# Patient Record
Sex: Male | Born: 1960 | Race: White | Hispanic: No | Marital: Single | Smoking: Current every day smoker
Health system: Southern US, Community
[De-identification: ages and names within clinical notes are randomized; demographics above are authoritative.]

## PROBLEM LIST (undated history)

## (undated) DIAGNOSIS — F1010 Alcohol abuse, uncomplicated: Secondary | ICD-10-CM

## (undated) DIAGNOSIS — I10 Essential (primary) hypertension: Secondary | ICD-10-CM

## (undated) DIAGNOSIS — K219 Gastro-esophageal reflux disease without esophagitis: Secondary | ICD-10-CM

## (undated) DIAGNOSIS — I82403 Acute embolism and thrombosis of unspecified deep veins of lower extremity, bilateral: Secondary | ICD-10-CM

## (undated) DIAGNOSIS — I2699 Other pulmonary embolism without acute cor pulmonale: Secondary | ICD-10-CM

## (undated) HISTORY — PX: TONSILLECTOMY: SUR1361

## (undated) HISTORY — PX: HERNIA REPAIR: SHX51

---

## 2001-03-27 ENCOUNTER — Ambulatory Visit (HOSPITAL_COMMUNITY): Admission: RE | Admit: 2001-03-27 | Discharge: 2001-03-27 | Payer: Self-pay | Admitting: Gastroenterology

## 2002-10-28 ENCOUNTER — Encounter: Payer: Self-pay | Admitting: Emergency Medicine

## 2002-10-28 ENCOUNTER — Emergency Department (HOSPITAL_COMMUNITY): Admission: EM | Admit: 2002-10-28 | Discharge: 2002-10-28 | Payer: Self-pay | Admitting: Emergency Medicine

## 2007-10-31 ENCOUNTER — Emergency Department (HOSPITAL_COMMUNITY): Admission: EM | Admit: 2007-10-31 | Discharge: 2007-10-31 | Payer: Self-pay | Admitting: Emergency Medicine

## 2008-05-25 ENCOUNTER — Emergency Department (HOSPITAL_COMMUNITY): Admission: EM | Admit: 2008-05-25 | Discharge: 2008-05-25 | Payer: Self-pay | Admitting: Emergency Medicine

## 2008-09-19 ENCOUNTER — Emergency Department (HOSPITAL_COMMUNITY): Admission: EM | Admit: 2008-09-19 | Discharge: 2008-09-19 | Payer: Self-pay | Admitting: Emergency Medicine

## 2008-09-26 ENCOUNTER — Emergency Department (HOSPITAL_COMMUNITY): Admission: EM | Admit: 2008-09-26 | Discharge: 2008-09-27 | Payer: Self-pay | Admitting: Emergency Medicine

## 2011-03-31 NOTE — Procedures (Signed)
[name]. [facility]  Patient:    Cody Porter, Cody Porter                       MRN: [id number] Proc. Date: 03/27/01 Adm. Date:  [id number] Attending:  Charna Elizabeth CC:         Laban Emperor. Cloward, M.D., [facility], [facility] [address]   Procedure Report  DATE OF BIRTH:  [birth year]  PROCEDURE PERFORMED:  Esophagogastroduodenoscopy.  ENDOSCOPIST:  Anselmo Rod, M.D.  INSTRUMENT USED:  Olympus video panendoscope.  INDICATION FOR PROCEDURE:  Epigastric pain and longstanding history of reflux in a 50 year old white male with a longstanding history of alcoholism.  Rule out varices, peptic ulcer disease, esophagitis, gastritis, etc.  PREPROCEDURE PREPARATION:  Informed consent was procured from the patient. The patient was fasting for eight hours prior to the procedure.  PREPROCEDURE PHYSICAL EXAMINATION:  VITAL SIGNS:  Stable.  NECK:  Supple.  CHEST:  Clear to auscultation.  S1, S2 regular  ABDOMEN:  Soft with normal bowel sounds.  DESCRIPTION OF THE PROCEDURE:  The patient was placed in left lateral decubitus position and sedated with 80 mg of Demerol and 7.5 mg of Versed intravenously.  Once the patient was adequately sedated and maintained on low-flow oxygen and continuous cardiac monitoring, the Olympus video panendoscope was advanced through the mouthpiece, over the tongue, into the esophagus under direct vision.  The entire esophagus was widely patent with no evidence of rings, stricture, mass, lesion, or esophagitis, or Barretts mucosa.  The scope was then advanced in the stomach.  There was mild antral gastritis.  The pylorus was widely open.  No ulcers, erosions, masses, or polyps were seen in the stomach.  The proximal small bowel also appeared healthy.  No varices were seen.  IMPRESSION: 1. Mild antral gastritis. 2. Widely patent esophagus, no evidence of esophagitis. 3. Normal proximal small bowel and gastric mucosa except for mild antral  gastritis.  RECOMMENDATIONS: 1. Continue PPI for now. 2. Avoid all nonsteroidals. 3. Abstain from alcohol use. 4. Outpatient followup in the next four weeks. DD:  03/27/01 TD:  03/28/01 Job: [id number]

## 2011-06-30 ENCOUNTER — Emergency Department (HOSPITAL_COMMUNITY): Payer: Self-pay

## 2011-06-30 ENCOUNTER — Emergency Department (HOSPITAL_COMMUNITY)
Admission: EM | Admit: 2011-06-30 | Discharge: 2011-07-01 | Disposition: A | Discharge status: Home / Self Care | Payer: Self-pay | Attending: Emergency Medicine | Admitting: Emergency Medicine

## 2011-06-30 DIAGNOSIS — R109 Unspecified abdominal pain: Secondary | ICD-10-CM | POA: Insufficient documentation

## 2011-06-30 DIAGNOSIS — R319 Hematuria, unspecified: Secondary | ICD-10-CM | POA: Insufficient documentation

## 2011-06-30 DIAGNOSIS — N200 Calculus of kidney: Secondary | ICD-10-CM | POA: Insufficient documentation

## 2011-06-30 LAB — URINALYSIS, ROUTINE W REFLEX MICROSCOPIC
Glucose, UA: NEGATIVE mg/dL
Ketones, ur: 15 mg/dL — AB
Protein, ur: 30 mg/dL — AB

## 2011-06-30 LAB — POCT I-STAT, CHEM 8
BUN: 19 mg/dL (ref 6–23)
Calcium, Ion: 1.12 mmol/L (ref 1.12–1.32)
Creatinine, Ser: 1.3 mg/dL (ref 0.50–1.35)
TCO2: 23 mmol/L (ref 0–100)

## 2011-06-30 LAB — URINE MICROSCOPIC-ADD ON

## 2011-07-02 LAB — URINE CULTURE: Colony Count: NO GROWTH

## 2011-08-15 LAB — BASIC METABOLIC PANEL
CO2: 23
Chloride: 100
Creatinine, Ser: 0.76
GFR calc Af Amer: >60
Potassium: 3.8

## 2011-08-15 LAB — DIFFERENTIAL
Basophils Relative: 1
Eosinophils Absolute: 0.3
Eosinophils Relative: 4
Lymphs Abs: 1.7
Monocytes Absolute: 0.8
Monocytes Relative: 8
Neutrophils Relative %: 70

## 2011-08-15 LAB — CBC
HCT: 47.3
Hemoglobin: 15.9
MCHC: 33.6
MCV: 93.9
RBC: 5.04

## 2011-08-18 LAB — CBC
MCHC: 34.9
Platelets: 372
RDW: 12.7

## 2011-08-18 LAB — DIFFERENTIAL
Basophils Absolute: 0.1
Basophils Relative: 1
Eosinophils Relative: 3
Lymphocytes Relative: 17
Monocytes Absolute: 0.6
Neutro Abs: 5.7

## 2011-08-18 LAB — D-DIMER, QUANTITATIVE: D-Dimer, Quant: 1.21 — ABNORMAL HIGH

## 2011-08-18 LAB — BASIC METABOLIC PANEL
BUN: 9
CO2: 23
Calcium: 9.5
Creatinine, Ser: 0.96
GFR calc non Af Amer: >60
Glucose, Bld: 102 — ABNORMAL HIGH

## 2011-08-18 LAB — POCT CARDIAC MARKERS
CKMB, poc: 1.5
Myoglobin, poc: 82.4

## 2019-01-28 ENCOUNTER — Other Ambulatory Visit: Payer: Self-pay

## 2019-01-28 ENCOUNTER — Inpatient Hospital Stay (HOSPITAL_COMMUNITY)
Admission: EM | Admit: 2019-01-28 | Discharge: 2019-02-03 | DRG: 175 | Disposition: A | Discharge status: Home / Self Care | Payer: Self-pay | Attending: Internal Medicine | Admitting: Internal Medicine

## 2019-01-28 ENCOUNTER — Ambulatory Visit: Payer: Self-pay

## 2019-01-28 ENCOUNTER — Emergency Department (HOSPITAL_COMMUNITY): Payer: Self-pay

## 2019-01-28 ENCOUNTER — Encounter (HOSPITAL_COMMUNITY): Payer: Self-pay | Admitting: Emergency Medicine

## 2019-01-28 DIAGNOSIS — I471 Supraventricular tachycardia: Secondary | ICD-10-CM | POA: Diagnosis present

## 2019-01-28 DIAGNOSIS — F1010 Alcohol abuse, uncomplicated: Secondary | ICD-10-CM | POA: Diagnosis present

## 2019-01-28 DIAGNOSIS — G9341 Metabolic encephalopathy: Secondary | ICD-10-CM | POA: Diagnosis not present

## 2019-01-28 DIAGNOSIS — I82433 Acute embolism and thrombosis of popliteal vein, bilateral: Secondary | ICD-10-CM | POA: Diagnosis present

## 2019-01-28 DIAGNOSIS — Z716 Tobacco abuse counseling: Secondary | ICD-10-CM

## 2019-01-28 DIAGNOSIS — I2601 Septic pulmonary embolism with acute cor pulmonale: Secondary | ICD-10-CM

## 2019-01-28 DIAGNOSIS — I2699 Other pulmonary embolism without acute cor pulmonale: Secondary | ICD-10-CM

## 2019-01-28 DIAGNOSIS — Z8249 Family history of ischemic heart disease and other diseases of the circulatory system: Secondary | ICD-10-CM

## 2019-01-28 DIAGNOSIS — I480 Paroxysmal atrial fibrillation: Secondary | ICD-10-CM | POA: Diagnosis present

## 2019-01-28 DIAGNOSIS — R0602 Shortness of breath: Secondary | ICD-10-CM

## 2019-01-28 DIAGNOSIS — I82403 Acute embolism and thrombosis of unspecified deep veins of lower extremity, bilateral: Secondary | ICD-10-CM

## 2019-01-28 DIAGNOSIS — J9601 Acute respiratory failure with hypoxia: Secondary | ICD-10-CM | POA: Diagnosis present

## 2019-01-28 DIAGNOSIS — R609 Edema, unspecified: Secondary | ICD-10-CM

## 2019-01-28 DIAGNOSIS — F17210 Nicotine dependence, cigarettes, uncomplicated: Secondary | ICD-10-CM | POA: Diagnosis present

## 2019-01-28 DIAGNOSIS — F10239 Alcohol dependence with withdrawal, unspecified: Secondary | ICD-10-CM | POA: Diagnosis not present

## 2019-01-28 DIAGNOSIS — I82413 Acute embolism and thrombosis of femoral vein, bilateral: Secondary | ICD-10-CM | POA: Diagnosis present

## 2019-01-28 DIAGNOSIS — I2609 Other pulmonary embolism with acute cor pulmonale: Principal | ICD-10-CM | POA: Diagnosis present

## 2019-01-28 DIAGNOSIS — Z8673 Personal history of transient ischemic attack (TIA), and cerebral infarction without residual deficits: Secondary | ICD-10-CM

## 2019-01-28 DIAGNOSIS — I82461 Acute embolism and thrombosis of right calf muscular vein: Secondary | ICD-10-CM | POA: Diagnosis present

## 2019-01-28 DIAGNOSIS — Z781 Physical restraint status: Secondary | ICD-10-CM

## 2019-01-28 DIAGNOSIS — I82441 Acute embolism and thrombosis of right tibial vein: Secondary | ICD-10-CM | POA: Diagnosis present

## 2019-01-28 DIAGNOSIS — I10 Essential (primary) hypertension: Secondary | ICD-10-CM | POA: Diagnosis present

## 2019-01-28 HISTORY — DX: Acute embolism and thrombosis of unspecified deep veins of lower extremity, bilateral: I82.403

## 2019-01-28 HISTORY — DX: Other pulmonary embolism without acute cor pulmonale: I26.99

## 2019-01-28 HISTORY — DX: Alcohol abuse, uncomplicated: F10.10

## 2019-01-28 LAB — D-DIMER, QUANTITATIVE: D-Dimer, Quant: 14.58 ug/mL-FEU — ABNORMAL HIGH (ref 0.00–0.50)

## 2019-01-28 LAB — CBC
HEMATOCRIT: 49.8 % (ref 39.0–52.0)
Hemoglobin: 16.5 g/dL (ref 13.0–17.0)
MCH: 33.5 pg (ref 26.0–34.0)
MCHC: 33.1 g/dL (ref 30.0–36.0)
MCV: 101.2 fL — AB (ref 80.0–100.0)
Platelets: 260 10*3/uL (ref 150–400)
RBC: 4.92 MIL/uL (ref 4.22–5.81)
RDW: 13.2 % (ref 11.5–15.5)
WBC: 10.5 10*3/uL (ref 4.0–10.5)
nRBC: 0 % (ref 0.0–0.2)

## 2019-01-28 LAB — BASIC METABOLIC PANEL
Anion gap: 12 (ref 5–15)
BUN: 17 mg/dL (ref 6–20)
CHLORIDE: 101 mmol/L (ref 98–111)
CO2: 20 mmol/L — AB (ref 22–32)
CREATININE: 1.1 mg/dL (ref 0.61–1.24)
Calcium: 9.3 mg/dL (ref 8.9–10.3)
GFR calc non Af Amer: >60 mL/min (ref >60)
GLUCOSE: 114 mg/dL — AB (ref 70–99)
Potassium: 4.4 mmol/L (ref 3.5–5.1)
Sodium: 133 mmol/L — ABNORMAL LOW (ref 135–145)

## 2019-01-28 LAB — BRAIN NATRIURETIC PEPTIDE: B Natriuretic Peptide: 214.4 pg/mL — ABNORMAL HIGH (ref 0.0–100.0)

## 2019-01-28 LAB — RAPID URINE DRUG SCREEN, HOSP PERFORMED
Amphetamines: NOT DETECTED
BENZODIAZEPINES: NOT DETECTED
Barbiturates: NOT DETECTED
Cocaine: NOT DETECTED
Opiates: POSITIVE — AB
Tetrahydrocannabinol: NOT DETECTED

## 2019-01-28 LAB — I-STAT TROPONIN, ED: Troponin i, poc: 0.04 ng/mL (ref 0.00–0.08)

## 2019-01-28 LAB — ETHANOL

## 2019-01-28 LAB — PROTIME-INR
INR: 0.9 (ref 0.8–1.2)
Prothrombin Time: 12.3 seconds (ref 11.4–15.2)

## 2019-01-28 LAB — APTT: aPTT: 38 seconds — ABNORMAL HIGH (ref 24–36)

## 2019-01-28 MED ORDER — THIAMINE HCL 100 MG/ML IJ SOLN
100.0000 mg | Freq: Every day | INTRAMUSCULAR | Status: DC
  Administered 2019-01-29 – 2019-02-02 (×5): 100 mg via INTRAVENOUS
  Filled 2019-01-28 (×6): qty 2

## 2019-01-28 MED ORDER — LORAZEPAM 2 MG/ML IJ SOLN
2.0000 mg | INTRAMUSCULAR | Status: DC | PRN
  Administered 2019-01-28 – 2019-01-31 (×4): 2 mg via INTRAVENOUS
  Filled 2019-01-28 (×4): qty 1

## 2019-01-28 MED ORDER — HEPARIN (PORCINE) 25000 UT/250ML-% IV SOLN
1300.0000 [IU]/h | INTRAVENOUS | Status: DC
  Administered 2019-01-28: 1300 [IU]/h via INTRAVENOUS
  Filled 2019-01-28: qty 250

## 2019-01-28 MED ORDER — SODIUM CHLORIDE (PF) 0.9 % IJ SOLN
INTRAMUSCULAR | Status: AC
  Filled 2019-01-28: qty 50

## 2019-01-28 MED ORDER — HYDRALAZINE HCL 20 MG/ML IJ SOLN
10.0000 mg | Freq: Once | INTRAMUSCULAR | Status: AC
  Administered 2019-01-29: 10 mg via INTRAVENOUS
  Filled 2019-01-28: qty 1

## 2019-01-28 MED ORDER — SODIUM CHLORIDE 0.9 % IV BOLUS
1000.0000 mL | Freq: Once | INTRAVENOUS | Status: AC
  Administered 2019-01-28: 1000 mL via INTRAVENOUS

## 2019-01-28 MED ORDER — HEPARIN BOLUS VIA INFUSION
4000.0000 [IU] | Freq: Once | INTRAVENOUS | Status: AC
  Administered 2019-01-28: 4000 [IU] via INTRAVENOUS
  Filled 2019-01-28: qty 4000

## 2019-01-28 MED ORDER — MORPHINE SULFATE (PF) 2 MG/ML IV SOLN
2.0000 mg | INTRAVENOUS | Status: DC | PRN
  Administered 2019-01-29 – 2019-02-02 (×20): 2 mg via INTRAVENOUS
  Filled 2019-01-28 (×21): qty 1

## 2019-01-28 MED ORDER — MORPHINE SULFATE (PF) 4 MG/ML IV SOLN
4.0000 mg | Freq: Once | INTRAVENOUS | Status: AC
  Administered 2019-01-28: 4 mg via INTRAVENOUS
  Filled 2019-01-28: qty 1

## 2019-01-28 MED ORDER — IOPAMIDOL (ISOVUE-370) INJECTION 76%
100.0000 mL | Freq: Once | INTRAVENOUS | Status: AC | PRN
  Administered 2019-01-28: 74 mL via INTRAVENOUS

## 2019-01-28 MED ORDER — METOPROLOL TARTRATE 12.5 MG HALF TABLET
12.5000 mg | ORAL_TABLET | Freq: Two times a day (BID) | ORAL | Status: DC
  Administered 2019-01-28 – 2019-01-29 (×2): 12.5 mg via ORAL
  Filled 2019-01-28 (×2): qty 1

## 2019-01-28 MED ORDER — FOLIC ACID 5 MG/ML IJ SOLN
1.0000 mg | Freq: Every day | INTRAMUSCULAR | Status: DC
  Administered 2019-01-29 – 2019-02-02 (×5): 1 mg via INTRAVENOUS
  Filled 2019-01-28 (×7): qty 0.2

## 2019-01-28 MED ORDER — THIAMINE HCL 100 MG/ML IJ SOLN
Freq: Once | INTRAVENOUS | Status: AC
  Administered 2019-01-28: 17:00:00 via INTRAVENOUS
  Filled 2019-01-28: qty 1000

## 2019-01-28 NOTE — ED Provider Notes (Signed)
[facility]-EMERGENCY DEPT Provider Note   CSN: [id number] Arrival date & time: 01/28/19  1121    History   Chief Complaint Chief Complaint  Patient presents with  . Chest Pain    HPI Cody Porter is a 58 y.o. male who presents with chest pain and cough.  Past medical history significant for hypertension, smoking, and alcohol abuse.  The patient states that for the past 5 days he has been coughing and having nonradiating left-sided chest pain. The pain is worse when he coughs and with certain movements. He feels congested but cannot cough anything up.  Today he coughed up up some sputum with streaks of blood in it which made him worried so he came to the ED.  He has some shortness of breath at times especially when he coughs.  He is unsure of fevers but feels hot and cold at times.  He smokes about a 1 pack daily.  He has tried cough drops with some relief.  He does not currently have a doctor.  He reports chronic lower leg edema for the past 3 years.  He gets better when he elevates his legs.  He denies any known history of cardiac or lung disease.  He has a decreased appetite and has not ate anything in 2 days.  He did has been drinking water and drinks about four 12 oz beers a day.     HPI  History reviewed. No pertinent past medical history.  There are no active problems to display for this patient.   Past Surgical History:  Procedure Laterality Date  . HERNIA REPAIR    . TONSILLECTOMY          Home Medications    Prior to Admission medications   Not on File    Family History No family history on file.  Social History Social History   Tobacco Use  . Smoking status: Current Every Day Smoker    Types: Cigarettes  . Smokeless tobacco: Never Used  Substance Use Topics  . Alcohol use: Yes    Alcohol/week: 14.0 standard drinks    Types: 14 Cans of beer per week  . Drug use: Not on file     Allergies   Patient has no known allergies.   Review of Systems Review of Systems  Constitutional: Positive for appetite change. Negative for chills and fever.  Respiratory: Positive for cough (+hemoptysis) and shortness of breath (at times). Negative for wheezing.   Cardiovascular: Positive for chest pain and leg swelling. Negative for palpitations.  Gastrointestinal: Negative for abdominal pain, nausea and vomiting.  Neurological: Negative for syncope and light-headedness.  All other systems reviewed and are negative.    Physical Exam Updated Vital Signs BP (!) 137/96   Pulse (!) 117   Resp (!) 24   Ht 5\' 8"  (1.727 m)   SpO2 94%   Physical Exam Vitals signs and nursing note reviewed.  Constitutional:      General: He is not in acute distress.    Appearance: He is well-developed. He is not ill-appearing.  HENT:     Head: Normocephalic and atraumatic.     Mouth/Throat:     Comments: Poor dentition Eyes:     General: No scleral icterus.       Right eye: No discharge.        Left eye: No discharge.     Conjunctiva/sclera: Conjunctivae normal.     Pupils: Pupils are equal, round, and reactive to light.  Neck:     Musculoskeletal: Normal range of motion.  Cardiovascular:     Rate and Rhythm: Regular rhythm. Tachycardia present.     Heart sounds: Normal heart sounds.  Pulmonary:     Effort: Pulmonary effort is normal. No respiratory distress.     Breath sounds: Normal breath sounds.  Abdominal:     General: There is no distension.     Palpations: Abdomen is soft.     Tenderness: There is no abdominal tenderness.  Musculoskeletal:     Right lower leg: Edema present.     Left lower leg: Edema present.     Comments: Significant pitting edema of the lower extremities bilaterally. Varicose veins. Some redness over the L lower extremity. 2+ DP pulses bilaterally.  Skin:    General: Skin is warm and dry.  Neurological:     Mental Status: He is alert and oriented to person, place, and time.  Psychiatric:         Behavior: Behavior normal.      ED Treatments / Results  Labs (all labs ordered are listed, but only abnormal results are displayed) Labs Reviewed  BASIC METABOLIC PANEL - Abnormal; Notable for the following components:      Result Value   Sodium 133 (*)    CO2 20 (*)    Glucose, Bld 114 (*)    All other components within normal limits  CBC - Abnormal; Notable for the following components:   MCV 101.2 (*)    All other components within normal limits  D-DIMER, QUANTITATIVE (NOT AT [facility]) - Abnormal; Notable for the following components:   D-Dimer, Quant 14.58 (*)    All other components within normal limits  BRAIN NATRIURETIC PEPTIDE - Abnormal; Notable for the following components:   B Natriuretic Peptide 214.4 (*)    All other components within normal limits  APTT  PROTIME-INR  RAPID URINE DRUG SCREEN, HOSP PERFORMED  HEPARIN LEVEL (UNFRACTIONATED)  I-STAT TROPONIN, ED    EKG EKG Interpretation  Date/Time:  Tuesday January 28 2019 13:48:57 EDT Ventricular Rate:  112 PR Interval:    QRS Duration: 86 QT Interval:  372 QTC Calculation: 508 R Axis:   3 Text Interpretation:  Sinus tachycardia Biatrial enlargement ST elevation, consider inferior injury Prolonged QT interval Baseline wander in lead(s) II aVR V1 No significant change since last tracing Confirmed by Gwyneth Sprout ([id number]) on 01/28/2019 2:33:51 PM   Radiology Dg Chest 2 View  Result Date: 01/28/2019 CLINICAL DATA:  LEFT-sided chest pain for 5 days. EXAM: CHEST - 2 VIEW COMPARISON:  09/19/2008. FINDINGS: Suspected lingular infiltrate/atelectasis, with volume loss in the LEFT hemithorax. No effusion. No pneumothorax. RIGHT lung clear. Heart size normal. No osseous findings. IMPRESSION: Suspected lingular infiltrate/atelectasis, with volume loss in the LEFT hemithorax. Electronically Signed   By: Elsie Stain M.D.   On: 01/28/2019 12:33   Ct Angio Chest Pe W/cm &/or Wo Cm  Result Date: 01/28/2019 CLINICAL DATA:   Chest pain EXAM: CT ANGIOGRAPHY CHEST WITH CONTRAST TECHNIQUE: Multidetector CT imaging of the chest was performed using the standard protocol during bolus administration of intravenous contrast. Multiplanar CT image reconstructions and MIPs were obtained to evaluate the vascular anatomy. CONTRAST:  74mL ISOVUE-370 COMPARISON:  Chest film from earlier in the same FINDINGS: Cardiovascular: Atherosclerotic calcifications of the thoracic aorta are noted without aneurysmal dilatation or dissection. No cardiac enlargement is noted. There are changes suggestive of mild right heart strain within RV/LV ratio of 1. The pulmonary artery demonstrates  bilateral pulmonary emboli throughout the entire arterial system. Mediastinum/Nodes: The esophagus shows evidence of a small sliding-type hiatal hernia. The thoracic inlet is within normal limits. No sizable hilar or mediastinal adenopathy is identified. Lungs/Pleura: Mild infiltrate is noted within the left lingula. This corresponds to the area of abnormality on recent chest x-ray and likely in part related to the known pulmonary embolism. No sizable effusion is seen. Mild emphysematous changes are noted. No sizable parenchymal nodules are seen. Upper Abdomen: Visualized upper abdomen is within normal limits. Musculoskeletal: Degenerative changes of the thoracic spine are seen. No compression deformities are noted. Review of the MIP images confirms the above findings. IMPRESSION: Positive for acute PE with CT evidence of right heart strain (RV/LV Ratio = 1) consistent with at least submassive (intermediate risk) PE. The presence of right heart strain has been associated with an increased risk of morbidity and mortality. Please activate Code PE by paging [phone]. Mild lingular infiltrate which is likely related to the underlying pulmonary embolism. Aortic Atherosclerosis (ICD10-I70.0) and Emphysema (ICD10-J43.9). Critical Value/emergent results were called by telephone at  the time of interpretation on 01/28/2019 at 3:10 pm to [facility] , who verbally acknowledged these results. Electronically Signed   By: Alcide Clever M.D.   On: 01/28/2019 15:13    Procedures Procedures (including critical care time)  CRITICAL CARE Performed by: Bethel Born   Total critical care time: 35 minutes  Critical care time was exclusive of separately billable procedures and treating other patients.  Critical care was necessary to treat or prevent imminent or life-threatening deterioration.  Critical care was time spent personally by me on the following activities: development of treatment plan with patient and/or surrogate as well as nursing, discussions with consultants, evaluation of patient's response to treatment, examination of patient, obtaining history from patient or surrogate, ordering and performing treatments and interventions, ordering and review of laboratory studies, ordering and review of radiographic studies, pulse oximetry and re-evaluation of patient's condition.   Medications Ordered in ED Medications  sodium chloride (PF) 0.9 % injection (has no administration in time range)  morphine 4 MG/ML injection 4 mg (has no administration in time range)  sodium chloride 0.9 % bolus 1,000 mL (0 mLs Intravenous Stopped 01/28/19 1517)  iopamidol (ISOVUE-370) 76 % injection 100 mL (74 mLs Intravenous Contrast Given 01/28/19 1450)     Initial Impression / Assessment and Plan / ED Course  I have reviewed the triage vital signs and the nursing notes.  Pertinent labs & imaging results that were available during my care of the patient were reviewed by me and considered in my medical decision making (see chart for details).  58 year old male presents with chest pain and cough for the past 5 days.  He is hypertensive here and mildly tachycardic.  He does not have a fever and there is no hypoxia.  On exam heart rate is fast and regular.  Lungs are clear to  auscultation.  He has significant lower extremity edema and the left leg appears slightly more erythematous than the right.  EKG is sinus tachycardia with prolonged QT.  Chest x-ray shows possible lower lobe pneumonia.  CBC is remarkable for mild elevation of MCV.  This is likely due to his drinking.  BMP is remarkable for hype mild hyponatremia.  Troponin is 0.04.  Will add on d-dimer and BNP due to leg swelling and hemoptysis.  12:30PM D-dimer is 14.  BNP is slightly elevated to 214.  Will order CTA of  chest  2:47 PM Rechecked pt to update plan of care. He seems somewhat distressed now. He states that he was told to lie down because fluids are running but it hurts a lot to lie down. He was allowed to sit up and CT has come to get him.  3:15PM Pt has bilateral PEs with evidence of heart strain. He is hemodynamically stable. Heparin ordered. Code PE was activated. Discussed with Dr. Vassie Loll with critical care - he recommends admission to hospitalist.   3:52 PM Discussed with Dr. Joseph Art who will admit.    Final Clinical Impressions(s) / ED Diagnoses   Final diagnoses:  Acute pulmonary embolism with acute cor pulmonale, unspecified pulmonary embolism type [facility])    ED Discharge Orders    None       Bethel Born, PA-C 01/28/19 1554    Gwyneth Sprout, MD 01/28/19 2048

## 2019-01-28 NOTE — Progress Notes (Signed)
Bilateral lower extremity venous duplex completed. Preliminary results in Chart review CV Proc. [employer], RVS 01/28/2019 4:41 PM

## 2019-01-28 NOTE — Progress Notes (Signed)
ANTICOAGULATION CONSULT NOTE - Initial Consult  Pharmacy Consult for Heparin Indication: pulmonary embolus  No Known Allergies  Patient Measurements: Height: 5\' 8"  (172.7 cm) Weight: 178 lb 8 oz (81 kg) IBW/kg (Calculated) : 68.4 Heparin Dosing Weight: actual body weight  Vital Signs: Temp: 98.2 F (36.8 C) (03/17 1300) Temp Source: Oral (03/17 1300) BP: 154/95 (03/17 1530) Pulse Rate: 115 (03/17 1530)  Labs: Recent Labs    01/28/19 1209  HGB 16.5  HCT 49.8  PLT 260  CREATININE 1.10    Estimated Creatinine Clearance: 70.8 mL/min (by C-G formula based on SCr of 1.1 mg/dL).   Medical History: History reviewed. No pertinent past medical history.  Medications:  No meds PTA  Assessment:  58 yr male with no significant PMH presents with left side chest pain x 5 days  CTAngio = + acute PE with evidence of right heart strain  Pharmacy consulted to dose IV heparin  Goal of Therapy:  Heparin level 0.3-0.7 units/ml Monitor platelets by anticoagulation protocol: Yes   Plan:   Obtain baseline aPTT and PT/INR  Heparin 4000 units IV bolus x 1 followed by heparin infusion @ 1300 units/hr  Check heparin level 6 hr after heparin started  Follow heparin level and CBC daily while on heparin infusion  Cody Porter, Joselyn Glassman, PharmD 01/28/2019,3:44 PM
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
## 2019-01-28 NOTE — Telephone Encounter (Signed)
Pt. And his mother report he has a productive cough with clear sputum that has streaks of blood. Unsure of fever. Shortness of breath and pain with coughing. Reports he does not have a PCP or insurance. Pt. Encouraged to go to ED for treatment, verbalizes understanding.  Reason for Disposition . Patient sounds very sick or weak to the triager  Answer Assessment - Initial Assessment Questions 1. ONSET: "When did the cough begin?"      Several days 2. SEVERITY: "How bad is the cough today?"      Moderate 3. RESPIRATORY DISTRESS: "Describe your breathing."      Shortness of breath 4. FEVER: "Do you have a fever?" If so, ask: "What is your temperature, how was it measured, and when did it start?"     Unsure 5. SPUTUM: "Describe the color of your sputum" (clear, white, yellow, green)     Clear 6. HEMOPTYSIS: "Are you coughing up any blood?" If so ask: "How much?" (flecks, streaks, tablespoons, etc.)     Flecks of blood 7. CARDIAC HISTORY: "Do you have any history of heart disease?" (e.g., heart attack, congestive heart failure)      No 8. LUNG HISTORY: "Do you have any history of lung disease?"  (e.g., pulmonary embolus, asthma, emphysema)     No 9. PE RISK FACTORS: "Do you have a history of blood clots?" (or: recent major surgery, recent prolonged travel, bedridden)     No 10. OTHER SYMPTOMS: "Do you have any other symptoms?" (e.g., runny nose, wheezing, chest pain)       Shortness of breath 11. PREGNANCY: "Is there any chance you are pregnant?" "When was your last menstrual period?"       No 12. TRAVEL: "Have you traveled out of the country in the last month?" (e.g., travel history, exposures)       No  Protocols used: COUGH - ACUTE PRODUCTIVE-A-AH

## 2019-01-28 NOTE — Progress Notes (Signed)
Attempted to page dr. Joseph Art with final preliminary impression. He was aware of the right side but not the left. There was no answer. [employer], RVS 01/28/2019 5:09 PM

## 2019-01-28 NOTE — H&P (Signed)
[facility] History and Physical  LEGEND TUMMINELLO ZOX:[id number] DOB: 04-22-1961 DOA: 01/28/2019   PCP: Patient, No Pcp Per   Chief Complaint: Chest pain with hemoptysis  HPI: Cody Porter is a 58 y.o. WM PMHx EtOH abuse,  presents with chest pain and cough.  Past medical history significant for hypertension and smoking and alcohol abuse.  The patient states that for the past 5 days he has been coughing and having nonradiating left-sided chest pain. The pain is worse when he coughs and with certain movements. He feels congested but cannot cough anything up.  Today he coughed up up some sputum with streaks of blood in it which made him worried so he came to the ED.  He has some shortness of breath at times especially when he coughs.  He is unsure of fevers but feels hot and cold at times.  He smokes about a 1 pack daily.  He has tried cough drops with some relief.  He does not currently have a doctor.  He reports chronic lower leg edema for the past 3 years.  He gets better when he elevates his legs.  He denies any known history of cardiac or lung disease.  He has a decreased appetite and has not ate anything in 2 days.  He did has been drinking water and drinks about four 12 oz beers a day.      Review of Systems:  Constitutional:  No weight loss, night sweats, Fevers, chills, fatigue.  HEENT:  No headaches, Difficulty swallowing,Tooth/dental problems,Sore throat,  No sneezing, itching, ear ache, nasal congestion, post nasal drip,  Cardio-vascular:  No chest pain, Orthopnea, PND, swelling in lower extremities, anasarca, dizziness, palpitations  GI:  No heartburn, indigestion, abdominal pain, nausea, vomiting, diarrhea, change in bowel habits, loss of appetite  Resp:  No shortness of breath with exertion or at rest. No excess mucus, no productive cough, No non-productive cough, No coughing up of blood.No change in color of mucus.No wheezing.No chest wall deformity  Skin:  no rash  or lesions.  GU:  no dysuria, change in color of urine, no urgency or frequency. No flank pain.  Musculoskeletal:  No joint pain or swelling. No decreased range of motion. No back pain.  Psych:  No change in mood or affect. No depression or anxiety. No memory loss.   Past Medical History:  Diagnosis Date   ETOH abuse 01/28/2019   Leg DVT (deep venous thromboembolism), acute, bilateral (HCC) 01/28/2019   Pulmonary embolus (HCC) 01/28/2019   Past Surgical History:  Procedure Laterality Date   HERNIA REPAIR     TONSILLECTOMY     Social History:  reports that he has been smoking cigarettes. He has never used smokeless tobacco. He reports current alcohol use of about 14.0 standard drinks of alcohol per week. No history on file for drug.  No Known Allergies  No family history on file.  Positive family history of DVT/PE (brother), negative HTN, negative HLD, negative cancer    Prior to Admission medications   Not on File     Consultants:  None    Procedures/Significant Events:  3/17 CTA chest:- Positive for acute PE with CT evidence of right heart strain (RV/LV Ratio = 1) consistent with at least submassive (intermediate risk) PE.  3/17 bilateral ultrasound lower extremity:-Right: acute DVT right femoral vein, right popliteal vein, right posterior tibial vein, and right gastrocnemius vein.  -Left: DVT left common femoral vein, left femoral vein, and left popliteal vein.  I have personally reviewed and interpreted all radiology studies and my findings are as above.   VENTILATOR SETTINGS:    Cultures   Antimicrobials: None   Devices None   LINES / TUBES:  None    Continuous Infusions:  heparin 1,300 Units/hr (01/28/19 1611)    Physical Exam: Vitals:   01/28/19 1600 01/28/19 1615 01/28/19 1630 01/28/19 1713  BP: (!) 156/92 (!) 159/101 (!) 167/101 (!) 152/91  Pulse: (!) 108 (!) 108 (!) 109 (!) 107  Resp: (!) 27 (!) 24 (!) 32 (!) 27  Temp:       TempSrc:      SpO2: 92% 93% 91% 93%  Weight:      Height:        Wt Readings from Last 3 Encounters:  01/28/19 81 kg    General: A/O x4 no acute respiratory distress (when patient remains still) Eyes: negative scleral hemorrhage, negative anisocoria, negative icterus ENT: Negative Runny nose, negative gingival bleeding, Neck:  Negative scars, masses, torticollis, lymphadenopathy, JVD Lungs: Clear to auscultation bilaterally without wheezes or crackles Cardiovascular: Tachycardic, negative murmur gallop or rub normal S1 and S2 Abdomen: negative abdominal pain, nondistended, positive soft, bowel sounds, no rebound, no ascites, no appreciable mass Extremities: Bilateral lower extremity edema from hip distally bilaterally Rt>>Lt Skin: Negative rashes, lesions, ulcers Psychiatric:  Negative depression, negative anxiety, negative fatigue, negative mania  Central nervous system:  Cranial nerves II through XII intact, tongue/uvula midline, all extremities muscle strength 5/5, sensation intact throughout,  negative dysarthria, negative expressive aphasia, negative receptive aphasia.        Labs on Admission:  Basic Metabolic Panel: Recent Labs  Lab 01/28/19 1209  NA 133*  K 4.4  CL 101  CO2 20*  GLUCOSE 114*  BUN 17  CREATININE 1.10  CALCIUM 9.3   Liver Function Tests: No results for input(s): AST, ALT, ALKPHOS, BILITOT, PROT, ALBUMIN in the last 168 hours. No results for input(s): LIPASE, AMYLASE in the last 168 hours. No results for input(s): AMMONIA in the last 168 hours. CBC: Recent Labs  Lab 01/28/19 1209  WBC 10.5  HGB 16.5  HCT 49.8  MCV 101.2*  PLT 260   Cardiac Enzymes: No results for input(s): CKTOTAL, CKMB, CKMBINDEX, TROPONINI in the last 168 hours.  BNP (last 3 results) Recent Labs    01/28/19 1210  BNP 214.4*    ProBNP (last 3 results) No results for input(s): PROBNP in the last 8760 hours.  CBG: No results for input(s): GLUCAP in the last 168  hours.  Radiological Exams on Admission: Dg Chest 2 View  Result Date: 01/28/2019 CLINICAL DATA:  LEFT-sided chest pain for 5 days. EXAM: CHEST - 2 VIEW COMPARISON:  09/19/2008. FINDINGS: Suspected lingular infiltrate/atelectasis, with volume loss in the LEFT hemithorax. No effusion. No pneumothorax. RIGHT lung clear. Heart size normal. No osseous findings. IMPRESSION: Suspected lingular infiltrate/atelectasis, with volume loss in the LEFT hemithorax. Electronically Signed   By: Elsie StainJohn T Curnes M.D.   On: 01/28/2019 12:33   Ct Angio Chest Pe W/cm &/or Wo Cm  Result Date: 01/28/2019 CLINICAL DATA:  Chest pain EXAM: CT ANGIOGRAPHY CHEST WITH CONTRAST TECHNIQUE: Multidetector CT imaging of the chest was performed using the standard protocol during bolus administration of intravenous contrast. Multiplanar CT image reconstructions and MIPs were obtained to evaluate the vascular anatomy. CONTRAST:  74mL [id number] COMPARISON:  Chest film from earlier in the same FINDINGS: Cardiovascular: Atherosclerotic calcifications of the thoracic aorta are noted without aneurysmal dilatation or dissection.  No cardiac enlargement is noted. There are changes suggestive of mild right heart strain within RV/LV ratio of 1. The pulmonary artery demonstrates bilateral pulmonary emboli throughout the entire arterial system. Mediastinum/Nodes: The esophagus shows evidence of a small sliding-type hiatal hernia. The thoracic inlet is within normal limits. No sizable hilar or mediastinal adenopathy is identified. Lungs/Pleura: Mild infiltrate is noted within the left lingula. This corresponds to the area of abnormality on recent chest x-ray and likely in part related to the known pulmonary embolism. No sizable effusion is seen. Mild emphysematous changes are noted. No sizable parenchymal nodules are seen. Upper Abdomen: Visualized upper abdomen is within normal limits. Musculoskeletal: Degenerative changes of the thoracic spine are seen.  No compression deformities are noted. Review of the MIP images confirms the above findings. IMPRESSION: Positive for acute PE with CT evidence of right heart strain (RV/LV Ratio = 1) consistent with at least submassive (intermediate risk) PE. The presence of right heart strain has been associated with an increased risk of morbidity and mortality. Please activate Code PE by paging [phone]. Mild lingular infiltrate which is likely related to the underlying pulmonary embolism. Aortic Atherosclerosis (ICD10-I70.0) and Emphysema (ICD10-J43.9). Critical Value/emergent results were called by telephone at the time of interpretation on 01/28/2019 at 3:10 pm to [facility] , who verbally acknowledged these results. Electronically Signed   By: Alcide Clever M.D.   On: 01/28/2019 15:13   Vas [place] Lower Extremity Venous (dvt) (only [facility])  Result Date: 01/28/2019  Lower Venous Study Indications: Edema, SOB, and pulmonary embolism.  Risk Factors: Confirmed PE. Performing Technologist: Toma Deiters RVS  Examination Guidelines: A complete evaluation includes B-mode imaging, spectral Doppler, color Doppler, and power Doppler as needed of all accessible portions of each vessel. Bilateral testing is considered an integral part of a complete examination. Limited examinations for reoccurring indications may be performed as noted.  Right Venous Findings: +---------+---------------+---------+-----------+----------+--------------+            Compressibility Phasicity Spontaneity Properties Summary         +---------+---------------+---------+-----------+----------+--------------+  CFV       Full            Yes                                              +---------+---------------+---------+-----------+----------+--------------+  SFJ       Full                                                             +---------+---------------+---------+-----------+----------+--------------+  FV Prox   Full            Yes       Yes                                     +---------+---------------+---------+-----------+----------+--------------+  FV Mid    None            No        No                                     +---------+---------------+---------+-----------+----------+--------------+  FV Distal None            No        No                                     +---------+---------------+---------+-----------+----------+--------------+  PFV       Full            Yes       Yes                                    +---------+---------------+---------+-----------+----------+--------------+  POP       None                                                             +---------+---------------+---------+-----------+----------+--------------+  PTV       Partial                                          distal region   +---------+---------------+---------+-----------+----------+--------------+  PERO                                                       Not visualized  +---------+---------------+---------+-----------+----------+--------------+  SSV       Partial                                                          +---------+---------------+---------+-----------+----------+--------------+ Unable to image the iliac veins  Left Venous Findings: +---------+---------------+---------+-----------+----------+-------------------+            Compressibility Phasicity Spontaneity Properties Summary              +---------+---------------+---------+-----------+----------+-------------------+  CFV       Partial         Yes       Yes                    mobile in the mid                                                                region at the sfj    +---------+---------------+---------+-----------+----------+-------------------+  SFJ       Partial                                          mobile               +---------+---------------+---------+-----------+----------+-------------------+  FV Prox   None                                                                   +---------+---------------+---------+-----------+----------+-------------------+  FV Mid    None                                                                  +---------+---------------+---------+-----------+----------+-------------------+  FV Distal None                                                                  +---------+---------------+---------+-----------+----------+-------------------+  PFV       Full            Yes       Yes                                         +---------+---------------+---------+-----------+----------+-------------------+  POP       Partial                                          minimal flow         +---------+---------------+---------+-----------+----------+-------------------+  PTV       Full                                                                  +---------+---------------+---------+-----------+----------+-------------------+  PERO                                                       not visualized due                                                               to edema             +---------+---------------+---------+-----------+----------+-------------------+ Unable to visualize the iliac veins    Summary: Right: Findings consistent with acute deep vein thrombosis involving the right femoral vein, right popliteal vein, right posterior tibial vein, and right gastrocnemius vein. See summaru comnets listed above Left: Findings consistent with acute  deep vein thrombosis involving the left common femoral vein, left femoral vein, and left popliteal vein. See summary comments listed above  *See table(s) above for measurements and observations.    Preliminary     EKG: Independently reviewed.  Pending   Assessment/Plan Active Problems:   ETOH abuse   Pulmonary embolus (HCC)   Leg DVT (deep venous thromboembolism), acute, bilateral (HCC)    Bilateral PE with heart strain - Continue full dose heparin per pharmacy - EKG pending -Echocardiogram  pending - Monitor closely for respiratory and cardiac compromise  Lower extremity DVT - See PE  EtOH abuse - EtOH level pending - Rapid drug screen pending - Continue CIWA even patient's admission that he drinks at least 4 beers a day  HTN Start metoprolol 12.5 mg twice daily    Code Status: Full (DVT Prophylaxis: Full dose heparin Family Communication: None Disposition Plan: TBD    Data Reviewed: Care during the described time interval was provided by me .  I have reviewed this patient's available data, including medical history, events of note, physical examination, and all test results as part of my evaluation.   Time spent: 60 min  Dilara Navarrete, Roselind Messier [facility] Pager [phone]

## 2019-01-29 ENCOUNTER — Inpatient Hospital Stay (HOSPITAL_COMMUNITY): Payer: Self-pay

## 2019-01-29 ENCOUNTER — Encounter (HOSPITAL_COMMUNITY): Payer: Self-pay

## 2019-01-29 DIAGNOSIS — I2699 Other pulmonary embolism without acute cor pulmonale: Secondary | ICD-10-CM

## 2019-01-29 LAB — COMPREHENSIVE METABOLIC PANEL
ALT: 14 U/L (ref 0–44)
AST: 19 U/L (ref 15–41)
Albumin: 3.2 g/dL — ABNORMAL LOW (ref 3.5–5.0)
Alkaline Phosphatase: 61 U/L (ref 38–126)
Anion gap: 14 (ref 5–15)
BILIRUBIN TOTAL: 1.2 mg/dL (ref 0.3–1.2)
BUN: 14 mg/dL (ref 6–20)
CALCIUM: 8.5 mg/dL — AB (ref 8.9–10.3)
CO2: 15 mmol/L — ABNORMAL LOW (ref 22–32)
Chloride: 105 mmol/L (ref 98–111)
Creatinine, Ser: 0.83 mg/dL (ref 0.61–1.24)
GFR calc Af Amer: >60 mL/min (ref >60)
Glucose, Bld: 104 mg/dL — ABNORMAL HIGH (ref 70–99)
Potassium: 4.1 mmol/L (ref 3.5–5.1)
Sodium: 134 mmol/L — ABNORMAL LOW (ref 135–145)
TOTAL PROTEIN: 7.2 g/dL (ref 6.5–8.1)

## 2019-01-29 LAB — ECHOCARDIOGRAM COMPLETE
Height: 68 in
Weight: 2856 oz

## 2019-01-29 LAB — CBC
HCT: 47.3 % (ref 39.0–52.0)
Hemoglobin: 15.4 g/dL (ref 13.0–17.0)
MCH: 33.8 pg (ref 26.0–34.0)
MCHC: 32.6 g/dL (ref 30.0–36.0)
MCV: 103.7 fL — AB (ref 80.0–100.0)
Platelets: 215 10*3/uL (ref 150–400)
RBC: 4.56 MIL/uL (ref 4.22–5.81)
RDW: 13.2 % (ref 11.5–15.5)
WBC: 11.6 10*3/uL — ABNORMAL HIGH (ref 4.0–10.5)
nRBC: 0 % (ref 0.0–0.2)

## 2019-01-29 LAB — HEPARIN LEVEL (UNFRACTIONATED)
Heparin Unfractionated: 0.27 IU/mL — ABNORMAL LOW (ref 0.30–0.70)
Heparin Unfractionated: 0.42 IU/mL (ref 0.30–0.70)
Heparin Unfractionated: 0.43 IU/mL (ref 0.30–0.70)

## 2019-01-29 LAB — MRSA PCR SCREENING: MRSA by PCR: NEGATIVE

## 2019-01-29 LAB — MAGNESIUM: Magnesium: 2.4 mg/dL (ref 1.7–2.4)

## 2019-01-29 LAB — PHOSPHORUS: Phosphorus: 2.9 mg/dL (ref 2.5–4.6)

## 2019-01-29 MED ORDER — AMIODARONE HCL IN DEXTROSE 360-4.14 MG/200ML-% IV SOLN: 60.0000 mg/h | INTRAVENOUS | Status: DC

## 2019-01-29 MED ORDER — HEPARIN (PORCINE) 25000 UT/250ML-% IV SOLN
1500.0000 [IU]/h | INTRAVENOUS | Status: DC
  Administered 2019-01-29: 1500 [IU]/h via INTRAVENOUS
  Filled 2019-01-29 (×2): qty 250

## 2019-01-29 MED ORDER — AMIODARONE HCL IN DEXTROSE 360-4.14 MG/200ML-% IV SOLN: 30.0000 mg/h | INTRAVENOUS | Status: DC

## 2019-01-29 MED ORDER — CHLORHEXIDINE GLUCONATE 0.12 % MT SOLN
15.0000 mL | Freq: Two times a day (BID) | OROMUCOSAL | Status: DC
  Administered 2019-01-29 – 2019-02-03 (×11): 15 mL via OROMUCOSAL
  Filled 2019-01-29 (×11): qty 15

## 2019-01-29 MED ORDER — AMIODARONE HCL IN DEXTROSE 360-4.14 MG/200ML-% IV SOLN
INTRAVENOUS | Status: AC
  Filled 2019-01-29: qty 200

## 2019-01-29 MED ORDER — DILTIAZEM HCL 100 MG IV SOLR: 5.0000 mg/h | INTRAVENOUS | Status: DC

## 2019-01-29 MED ORDER — DILTIAZEM HCL 25 MG/5ML IV SOLN
5.0000 mg | Freq: Once | INTRAVENOUS | Status: DC | PRN
  Filled 2019-01-29 (×2): qty 5

## 2019-01-29 MED ORDER — METOPROLOL TARTRATE 12.5 MG HALF TABLET
12.5000 mg | ORAL_TABLET | Freq: Four times a day (QID) | ORAL | Status: DC
  Administered 2019-01-29 – 2019-01-30 (×4): 12.5 mg via ORAL
  Filled 2019-01-29 (×4): qty 1

## 2019-01-29 MED ORDER — PERFLUTREN LIPID MICROSPHERE
1.0000 mL | INTRAVENOUS | Status: AC | PRN
  Administered 2019-01-29: 3 mL via INTRAVENOUS
  Filled 2019-01-29: qty 10

## 2019-01-29 MED ORDER — CHLORHEXIDINE GLUCONATE CLOTH 2 % EX PADS
6.0000 | MEDICATED_PAD | Freq: Every day | CUTANEOUS | Status: DC
  Administered 2019-01-29 – 2019-02-02 (×4): 6 via TOPICAL

## 2019-01-29 MED ORDER — AMIODARONE LOAD VIA INFUSION: 150.0000 mg | Freq: Once | INTRAVENOUS | Status: DC

## 2019-01-29 MED ORDER — DILTIAZEM LOAD VIA INFUSION
10.0000 mg | Freq: Once | INTRAVENOUS | Status: DC
  Filled 2019-01-29: qty 10

## 2019-01-29 MED ORDER — HEPARIN BOLUS VIA INFUSION
2000.0000 [IU] | Freq: Once | INTRAVENOUS | Status: AC
  Administered 2019-01-29: 2000 [IU] via INTRAVENOUS
  Filled 2019-01-29: qty 2000

## 2019-01-29 MED ORDER — ORAL CARE MOUTH RINSE
15.0000 mL | Freq: Two times a day (BID) | OROMUCOSAL | Status: DC
  Administered 2019-01-29 – 2019-02-01 (×7): 15 mL via OROMUCOSAL

## 2019-01-29 NOTE — Progress Notes (Signed)
  Echocardiogram 2D Echocardiogram has been performed.  Cody Porter M 01/29/2019, 8:48 AM

## 2019-01-29 NOTE — Progress Notes (Signed)
eLink Physician-Brief Progress Note Patient Name: LENNY DITTER DOB: Sep 05, 1961 MRN: [id number]   Date of Service  01/29/2019  HPI/Events of Note  Spontaneous conversion to sinus rhythm with rate = 112.   eICU Interventions  Will D/C Amiodarone orders.      Intervention Category Major Interventions: Arrhythmia - evaluation and management  Sommer,Steven Eugene 01/29/2019, 2:07 AM

## 2019-01-29 NOTE — Progress Notes (Signed)
ANTICOAGULATION CONSULT NOTE  Pharmacy Consult for Heparin Indication: pulmonary embolus  No Known Allergies  Patient Measurements: Height: 5\' 8"  (172.7 cm) Weight: 178 lb 8 oz (81 kg) IBW/kg (Calculated) : 68.4 Heparin Dosing Weight: actual body weight  Vital Signs: Temp: 99.9 F (37.7 C) (03/17 2358) Temp Source: Oral (03/17 2358) BP: 191/115 (03/18 0006) Pulse Rate: 101 (03/18 0000)  Labs: Recent Labs    01/28/19 1209 01/28/19 1210 01/29/19 0004  HGB 16.5  --   --   HCT 49.8  --   --   PLT 260  --   --   APTT  --  38*  --   LABPROT  --  12.3  --   INR  --  0.9  --   HEPARINUNFRC  --   --  0.27*  CREATININE 1.10  --   --    Estimated Creatinine Clearance: 70.8 mL/min (by C-G formula based on SCr of 1.1 mg/dL).  Medical History: Past Medical History:  Diagnosis Date  . ETOH abuse 01/28/2019  . Leg DVT (deep venous thromboembolism), acute, bilateral (HCC) 01/28/2019  . Pulmonary embolus (HCC) 01/28/2019   Medications:  No meds PTA  Assessment:  58 yr male with no significant PMH presents with left side chest pain x 5 days  CTAngio = + acute PE with evidence of right heart strain   Bilateral DVT per doppler  Pharmacy consulted to dose IV heparin, 4000 unit bolus charted at 1600, infusion at 1300 units/hr  Goal of Therapy:  Heparin level 0.3-0.7 units/ml Monitor platelets by anticoagulation protocol: Yes   Today, 01/29/2019 1st Hep level ordered 2200, drawn 2400 = 0.27, below desired range   Plan:   Re-bolus Heparin 2000 units, increase infusion to 1500 units/hr  Recheck Hep level at 0800  Daily CBC, daily Heparin when at steady state  Monitor CBC, s/s bleed  Otho Bellows PharmD Pager [phone] 01/29/2019, 2:38 AM

## 2019-01-29 NOTE — Progress Notes (Signed)
eLink Physician-Brief Progress Note Patient Name: Cody Porter DOB: [birth year] MRN: [id number]   Date of Service  01/29/2019  HPI/Events of Note  AFIB with RVR - Ventricular rate = 201.   eICU Interventions  Will order: 1. Amiodarone IV load and infusion.  2. Please send AM labs now.  3. Further management per primary service.      Intervention Category Major Interventions: Arrhythmia - evaluation and management  [name] 01/29/2019, 2:01 AM

## 2019-01-29 NOTE — Progress Notes (Signed)
Pt heart rate 200, sustained. Instructed to vagal down. Vagal maneuvers ineffective. E-link buzzed in. EKG obtained reading SVT with ST abnormality. Pt does not endorse unusual discomfort. States that he feels the same shortness of breath and left sided chest pain he's been feeling. Sats dropping from 94% on 3L to 90%. Increased to 5L o2 via nasal cannula instructed pt to take deep breaths. Orders from e-link to start amiodarone but pt flipped back into ST before amiodarone started. Lab called for morning labs. [facility] notified. Pt states that he feels "alright" and "back to normal" at this time. Will continue to closely monitor.

## 2019-01-29 NOTE — Progress Notes (Addendum)
ANTICOAGULATION CONSULT NOTE - Follow Up Consult  Pharmacy Consult for Heparin Indication: pulmonary embolus and DVT  No Known Allergies  Patient Measurements: Height: 5\' 8"  (172.7 cm) Weight: 178 lb 8 oz (81 kg) IBW/kg (Calculated) : 68.4 Heparin Dosing Weight: TBW  Vital Signs: Temp: 98.4 F (36.9 C) (03/18 0359) Temp Source: Oral (03/18 0359) BP: 154/91 (03/18 0500) Pulse Rate: 98 (03/18 0500)  Labs: Recent Labs    01/28/19 1209 01/28/19 1210 01/29/19 0004 01/29/19 0217  HGB 16.5  --   --  15.4  HCT 49.8  --   --  47.3  PLT 260  --   --  215  APTT  --  38*  --   --   LABPROT  --  12.3  --   --   INR  --  0.9  --   --   HEPARINUNFRC  --   --  0.27*  --   CREATININE 1.10  --   --  0.83    Estimated Creatinine Clearance: 93.9 mL/min (by C-G formula based on SCr of 0.83 mg/dL).   Medications:  Infusions:  . amiodarone    . heparin 1,500 Units/hr (01/28/19 2355)    Assessment: 31 yoM with no significant PMH presents on 3/17 with hemoptysis, left side chest pain x 5 days.  CTAngio = + acute PE with evidence of right heart strain.  [place] + Bilateral LE acute DVT.  Pharmacy consulted to dose IV heparin. No prior anticoagulation  Today, 01/29/2019: Heparin level 0.43, therapeutic CBC:  Hgb and Plt WNL No bleeding or complications noted.  Goal of Therapy:  Heparin level 0.3-0.7 units/ml Monitor platelets by anticoagulation protocol: Yes   Plan:  Continue heparin IV infusion at 1500 units/hr Heparin level in 6 hours to confirm therapeutic level.   Daily heparin level and CBC Continue to monitor H&H and platelets   Lynann Beaver PharmD, BCPS Pager [phone] 01/29/2019 7:47 AM   Addendum: 3/18 Echo without RV dysfunction Heparin level 0.42, remains stable and therapeutic on heparin at heparin 1500 units/hr. No bleeding or complications reported.  Plan:  Continue heparin IV infusion at 1500 units/hr  Daily heparin level and CBC  Follow up long-term  anticoagulation plans.  Lynann Beaver PharmD, BCPS Pager [phone] 01/29/2019 3:17 PM

## 2019-01-29 NOTE — Progress Notes (Addendum)
PROGRESS NOTE    Cody Porter  VOH:[id number] DOB: [birth year] DOA: 01/28/2019 PCP: Patient, No Pcp Per  Brief Narrative:  58 year old with past medical history relevant for tobacco abuse, alcohol abuse admitted with acute hypoxic respiratory failure due to submassive PE and bilateral lower extremity DVT with course complicated by runs of atrial fibrillation with rapid ventricular response.   Assessment & Plan:   Active Problems:   ETOH abuse   Pulmonary embolus (HCC)   Leg DVT (deep venous thromboembolism), acute, bilateral (HCC)   Acute pulmonary embolism (HCC)   #) Acute hypoxic respiratory failure due to submassive PE/bilateral DVT: This appears to be somewhat improving. -Echo 01/29/2019 pending -Continue heparin drip, will transition to novel oral anticoagulation after 48 hours BNP modestly elevated, troponin negative -We will discuss with pulmonary critical care about lytic catheter directed pending echo  #) Paroxysmal atrial fibrillation: Likely secondary to submassive PE. -Echo pending -Continue heparin drip, patient will need anticoagulation regardless of his chads VaSc score  #) Tobacco abuse: -Counseling provided -Continue nicotine patch  #) Alcohol abuse: -CIWA protocol -Continue thiamine and folate supplementation  Fluids: Tolerating p.o. Electrolytes: Monitor and supplement Nutrition: Regular diet next Prophylaxis: Heparin drip  Disposition: Pending submassive PE work-up and novel oral anticoagulant  Full code    Consultants:   Pulmonary critical care  Procedures:   Echo 01/29/2019: Pending read  Antimicrobials:   none    Subjective: This morning the patient reports he is doing somewhat better.  He continues to report dyspnea on exertion and shortness of breath but denies any nausea, vomiting, diarrhea, chest pain, cough, congestion, rhinorrhea.  Objective: Vitals:   01/29/19 0500 01/29/19 0700 01/29/19 0710 01/29/19 0900  BP: (!) 154/91  (!) 151/95  (!) 154/99  Pulse: 98 94  93  Resp: (!) 22 (!) 21  19  Temp:   98.4 F (36.9 C)   TempSrc:   Oral   SpO2: 95% 96%  94%  Weight:      Height:        Intake/Output Summary (Last 24 hours) at 01/29/2019 0943 Last data filed at 01/29/2019 0930 Gross per 24 hour  Intake 1244.55 ml  Output 150 ml  Net 1094.55 ml   Filed Weights   01/28/19 1538  Weight: 81 kg    Examination:  General exam: Appears calm and comfortable  Respiratory system: Clear to auscultation. Respiratory effort normal.  Diminished lung sounds at bases Cardiovascular system: Distant heart sounds, regular rate and rhythm, no murmurs Gastrointestinal system: Abdomen is nondistended, soft and nontender. No organomegaly or masses felt. Normal bowel sounds heard. Central nervous system: Alert and oriented.  Grossly intact, moving all extremities Extremities: Trace lower extremity edema, right greater than left Skin: No rashes over visible skin Psychiatry: Judgement and insight appear normal. Mood & affect appropriate.     Data Reviewed: I have personally reviewed following labs and imaging studies  CBC: Recent Labs  Lab 01/28/19 1209 01/29/19 0217  WBC 10.5 11.6*  HGB 16.5 15.4  HCT 49.8 47.3  MCV 101.2* 103.7*  PLT 260 215   Basic Metabolic Panel: Recent Labs  Lab 01/28/19 1209 01/29/19 0217  NA 133* 134*  K 4.4 4.1  CL 101 105  CO2 20* 15*  GLUCOSE 114* 104*  BUN 17 14  CREATININE 1.10 0.83  CALCIUM 9.3 8.5*  MG  --  2.4  PHOS  --  2.9   GFR: Estimated Creatinine Clearance: 93.9 mL/min (by C-G formula based on SCr  of 0.83 mg/dL). Liver Function Tests: Recent Labs  Lab 01/29/19 0217  AST 19  ALT 14  ALKPHOS 61  BILITOT 1.2  PROT 7.2  ALBUMIN 3.2*   No results for input(s): LIPASE, AMYLASE in the last 168 hours. No results for input(s): AMMONIA in the last 168 hours. Coagulation Profile: Recent Labs  Lab 01/28/19 1210  INR 0.9   Cardiac Enzymes: No results for  input(s): CKTOTAL, CKMB, CKMBINDEX, TROPONINI in the last 168 hours. BNP (last 3 results) No results for input(s): PROBNP in the last 8760 hours. HbA1C: No results for input(s): HGBA1C in the last 72 hours. CBG: No results for input(s): GLUCAP in the last 168 hours. Lipid Profile: No results for input(s): CHOL, HDL, LDLCALC, TRIG, CHOLHDL, LDLDIRECT in the last 72 hours. Thyroid Function Tests: No results for input(s): TSH, T4TOTAL, FREET4, T3FREE, THYROIDAB in the last 72 hours. Anemia Panel: No results for input(s): VITAMINB12, FOLATE, FERRITIN, TIBC, IRON, RETICCTPCT in the last 72 hours. Sepsis Labs: No results for input(s): PROCALCITON, LATICACIDVEN in the last 168 hours.  Recent Results (from the past 240 hour(s))  MRSA PCR Screening     Status: None   Collection Time: 01/28/19 11:55 PM  Result Value Ref Range Status   MRSA by PCR NEGATIVE NEGATIVE Final    Comment:        The GeneXpert MRSA Assay (FDA approved for NASAL specimens only), is one component of a comprehensive MRSA colonization surveillance program. It is not intended to diagnose MRSA infection nor to guide or monitor treatment for MRSA infections. Performed at [facility], [address] [place], [place] [address]          Radiology Studies: Dg Chest 2 View  Result Date: 01/28/2019 CLINICAL DATA:  LEFT-sided chest pain for 5 days. EXAM: CHEST - 2 VIEW COMPARISON:  09/19/2008. FINDINGS: Suspected lingular infiltrate/atelectasis, with volume loss in the LEFT hemithorax. No effusion. No pneumothorax. RIGHT lung clear. Heart size normal. No osseous findings. IMPRESSION: Suspected lingular infiltrate/atelectasis, with volume loss in the LEFT hemithorax. Electronically Signed   By: Elsie Stain M.D.   On: 01/28/2019 12:33   Ct Angio Chest Pe W/cm &/or Wo Cm  Result Date: 01/28/2019 CLINICAL DATA:  Chest pain EXAM: CT ANGIOGRAPHY CHEST WITH CONTRAST TECHNIQUE: Multidetector CT imaging  of the chest was performed using the standard protocol during bolus administration of intravenous contrast. Multiplanar CT image reconstructions and MIPs were obtained to evaluate the vascular anatomy. CONTRAST:  74mL [id number] COMPARISON:  Chest film from earlier in the same FINDINGS: Cardiovascular: Atherosclerotic calcifications of the thoracic aorta are noted without aneurysmal dilatation or dissection. No cardiac enlargement is noted. There are changes suggestive of mild right heart strain within RV/LV ratio of 1. The pulmonary artery demonstrates bilateral pulmonary emboli throughout the entire arterial system. Mediastinum/Nodes: The esophagus shows evidence of a small sliding-type hiatal hernia. The thoracic inlet is within normal limits. No sizable hilar or mediastinal adenopathy is identified. Lungs/Pleura: Mild infiltrate is noted within the left lingula. This corresponds to the area of abnormality on recent chest x-ray and likely in part related to the known pulmonary embolism. No sizable effusion is seen. Mild emphysematous changes are noted. No sizable parenchymal nodules are seen. Upper Abdomen: Visualized upper abdomen is within normal limits. Musculoskeletal: Degenerative changes of the thoracic spine are seen. No compression deformities are noted. Review of the MIP images confirms the above findings. IMPRESSION: Positive for acute PE with CT evidence of right heart strain (  RV/LV Ratio = 1) consistent with at least submassive (intermediate risk) PE. The presence of right heart strain has been associated with an increased risk of morbidity and mortality. Please activate Code PE by paging [phone]. Mild lingular infiltrate which is likely related to the underlying pulmonary embolism. Aortic Atherosclerosis (ICD10-I70.0) and Emphysema (ICD10-J43.9). Critical Value/emergent results were called by telephone at the time of interpretation on 01/28/2019 at 3:10 pm to [facility] , who verbally  acknowledged these results. Electronically Signed   By: Alcide Clever M.D.   On: 01/28/2019 15:13   Vas [place] Lower Extremity Venous (dvt) (only [facility])  Result Date: 01/28/2019  Lower Venous Study Indications: Edema, SOB, and pulmonary embolism.  Risk Factors: Confirmed PE. Performing Technologist: Toma Deiters RVS  Examination Guidelines: A complete evaluation includes B-mode imaging, spectral Doppler, color Doppler, and power Doppler as needed of all accessible portions of each vessel. Bilateral testing is considered an integral part of a complete examination. Limited examinations for reoccurring indications may be performed as noted.  Right Venous Findings: +---------+---------------+---------+-----------+----------+--------------+            Compressibility Phasicity Spontaneity Properties Summary         +---------+---------------+---------+-----------+----------+--------------+  CFV       Full            Yes                                              +---------+---------------+---------+-----------+----------+--------------+  SFJ       Full                                                             +---------+---------------+---------+-----------+----------+--------------+  FV Prox   Full            Yes       Yes                                    +---------+---------------+---------+-----------+----------+--------------+  FV Mid    None            No        No                                     +---------+---------------+---------+-----------+----------+--------------+  FV Distal None            No        No                                     +---------+---------------+---------+-----------+----------+--------------+  PFV       Full            Yes       Yes                                    +---------+---------------+---------+-----------+----------+--------------+  POP  None                                                              +---------+---------------+---------+-----------+----------+--------------+  PTV       Partial                                          distal region   +---------+---------------+---------+-----------+----------+--------------+  PERO                                                       Not visualized  +---------+---------------+---------+-----------+----------+--------------+  SSV       Partial                                                          +---------+---------------+---------+-----------+----------+--------------+ Unable to image the iliac veins  Left Venous Findings: +---------+---------------+---------+-----------+----------+-------------------+            Compressibility Phasicity Spontaneity Properties Summary              +---------+---------------+---------+-----------+----------+-------------------+  CFV       Partial         Yes       Yes                    mobile in the mid                                                                region at the sfj    +---------+---------------+---------+-----------+----------+-------------------+  SFJ       Partial                                          mobile               +---------+---------------+---------+-----------+----------+-------------------+  FV Prox   None                                                                  +---------+---------------+---------+-----------+----------+-------------------+  FV Mid    None                                                                  +---------+---------------+---------+-----------+----------+-------------------+  FV Distal None                                                                  +---------+---------------+---------+-----------+----------+-------------------+  PFV       Full            Yes       Yes                                         +---------+---------------+---------+-----------+----------+-------------------+  POP       Partial                                           minimal flow         +---------+---------------+---------+-----------+----------+-------------------+  PTV       Full                                                                  +---------+---------------+---------+-----------+----------+-------------------+  PERO                                                       not visualized due                                                               to edema             +---------+---------------+---------+-----------+----------+-------------------+ Unable to visualize the iliac veins    Summary: Right: Findings consistent with acute deep vein thrombosis involving the right femoral vein, right popliteal vein, right posterior tibial vein, and right gastrocnemius vein. See summaru comnets listed above Left: Findings consistent with acute deep vein thrombosis involving the left common femoral vein, left femoral vein, and left popliteal vein. See summary comments listed above  *See table(s) above for measurements and observations.    Preliminary         Scheduled Meds:  chlorhexidine  15 mL Mouth Rinse BID   Chlorhexidine Gluconate Cloth  6 each Topical Daily   folic acid  1 mg Intravenous Daily   mouth rinse  15 mL Mouth Rinse q12n4p   metoprolol tartrate  12.5 mg Oral Q6H   thiamine injection  100 mg Intravenous Daily   Continuous Infusions:  amiodarone     heparin 1,500 Units/hr (01/28/19 2355)     LOS: 1 day    Time spent: 35    Delaine Lame, MD [facility]  If 7PM-7AM, please contact night-coverage [email or web address] Password [facility] 01/29/2019,  9:43 AM

## 2019-01-30 LAB — HEPARIN LEVEL (UNFRACTIONATED)
Heparin Unfractionated: 0.24 IU/mL — ABNORMAL LOW (ref 0.30–0.70)
Heparin Unfractionated: 0.27 IU/mL — ABNORMAL LOW (ref 0.30–0.70)
Heparin Unfractionated: 0.44 IU/mL (ref 0.30–0.70)

## 2019-01-30 LAB — BASIC METABOLIC PANEL
Anion gap: 15 (ref 5–15)
BUN: 17 mg/dL (ref 6–20)
CO2: 18 mmol/L — ABNORMAL LOW (ref 22–32)
Calcium: 8.7 mg/dL — ABNORMAL LOW (ref 8.9–10.3)
Chloride: 101 mmol/L (ref 98–111)
Creatinine, Ser: 0.89 mg/dL (ref 0.61–1.24)
GFR calc Af Amer: >60 mL/min (ref >60)
GFR calc non Af Amer: >60 mL/min (ref >60)
Glucose, Bld: 102 mg/dL — ABNORMAL HIGH (ref 70–99)
Potassium: 3.9 mmol/L (ref 3.5–5.1)
Sodium: 134 mmol/L — ABNORMAL LOW (ref 135–145)

## 2019-01-30 LAB — CBC
HCT: 47.6 % (ref 39.0–52.0)
Hemoglobin: 15 g/dL (ref 13.0–17.0)
MCH: 32.7 pg (ref 26.0–34.0)
MCHC: 31.5 g/dL (ref 30.0–36.0)
MCV: 103.7 fL — ABNORMAL HIGH (ref 80.0–100.0)
Platelets: 261 10*3/uL (ref 150–400)
RBC: 4.59 MIL/uL (ref 4.22–5.81)
RDW: 13 % (ref 11.5–15.5)
WBC: 10.6 10*3/uL — ABNORMAL HIGH (ref 4.0–10.5)
nRBC: 0 % (ref 0.0–0.2)

## 2019-01-30 MED ORDER — HEPARIN BOLUS VIA INFUSION
1200.0000 [IU] | Freq: Once | INTRAVENOUS | Status: AC
  Administered 2019-01-30: 1200 [IU] via INTRAVENOUS
  Filled 2019-01-30: qty 1200

## 2019-01-30 MED ORDER — METOPROLOL TARTRATE 25 MG PO TABS
25.0000 mg | ORAL_TABLET | Freq: Four times a day (QID) | ORAL | Status: DC
  Administered 2019-01-30 – 2019-02-01 (×5): 25 mg via ORAL
  Filled 2019-01-30 (×6): qty 1

## 2019-01-30 MED ORDER — METOPROLOL TARTRATE 5 MG/5ML IV SOLN
INTRAVENOUS | Status: AC
  Administered 2019-01-30: 5 mg
  Filled 2019-01-30: qty 5

## 2019-01-30 MED ORDER — METOPROLOL TARTRATE 5 MG/5ML IV SOLN
5.0000 mg | Freq: Once | INTRAVENOUS | Status: AC
  Administered 2019-01-30: 5 mg via INTRAVENOUS

## 2019-01-30 MED ORDER — HEPARIN (PORCINE) 25000 UT/250ML-% IV SOLN
2000.0000 [IU]/h | INTRAVENOUS | Status: AC
  Administered 2019-01-30: 1850 [IU]/h via INTRAVENOUS
  Administered 2019-01-30: 1700 [IU]/h via INTRAVENOUS
  Administered 2019-01-31: 1850 [IU]/h via INTRAVENOUS
  Administered 2019-01-31: 2000 [IU]/h via INTRAVENOUS
  Filled 2019-01-30 (×3): qty 250

## 2019-01-30 MED ORDER — HYDRALAZINE HCL 20 MG/ML IJ SOLN
10.0000 mg | Freq: Once | INTRAMUSCULAR | Status: AC
  Administered 2019-01-30: 10 mg via INTRAVENOUS
  Filled 2019-01-30: qty 1

## 2019-01-30 NOTE — Progress Notes (Signed)
PT Cancellation Note  Patient Details Name: Cody Porter MRN: [id number] DOB: Jul 17, 1961   Cancelled Treatment:    Reason Eval/Treat Not Completed: Medical issues which prohibited therapy, positive for PE and DVT, per RN, delay PT eval today.   Rada Hay 01/30/2019, 1:37 PM  Blanchard Kelch PT Acute Rehabilitation Services Pager [phone] Office [phone]

## 2019-01-30 NOTE — Progress Notes (Signed)
ANTICOAGULATION CONSULT NOTE - Follow Up Consult  Pharmacy Consult for Heparin Indication: pulmonary embolus and DVT  No Known Allergies  Patient Measurements: Height: 5\' 8"  (172.7 cm) Weight: 178 lb 8 oz (81 kg) IBW/kg (Calculated) : 68.4 Heparin Dosing Weight: TBW  Vital Signs: Temp: 98.3 F (36.8 C) (03/19 1150) Temp Source: Axillary (03/19 1150) BP: 136/78 (03/19 1423) Pulse Rate: 93 (03/19 1423)  Labs: Recent Labs    01/28/19 1209 01/28/19 1210  01/29/19 0217  01/29/19 1402 01/30/19 0247 01/30/19 1313  HGB 16.5  --   --  15.4  --   --  15.0  --   HCT 49.8  --   --  47.3  --   --  47.6  --   PLT 260  --   --  215  --   --  261  --   APTT  --  38*  --   --   --   --   --   --   LABPROT  --  12.3  --   --   --   --   --   --   INR  --  0.9  --   --   --   --   --   --   HEPARINUNFRC  --   --    < >  --    < > 0.42 0.24* 0.27*  CREATININE 1.10  --   --  0.83  --   --  0.89  --    < > = values in this interval not displayed.    Estimated Creatinine Clearance: 87.5 mL/min (by C-G formula based on SCr of 0.89 mg/dL).   Medications:  Infusions:  . heparin 1,700 Units/hr (01/30/19 1400)   Assessment: 8 yoM with no significant PMH presents on 3/17 with hemoptysis, left side chest pain x 5 days.  CTAngio = + acute PE with evidence of right heart strain.  [place] + Bilateral LE acute DVT.  Pharmacy consulted to dose IV heparin. No prior anticoagulation  Today, 01/30/2019: Heparin level 0.24, now sub- therapeutic CBC:  Hgb and Plt WNL No bleeding or complications noted.  Today 01/30/19 3:07 PM  HL increased somewhat but still below goal No bleeding/line issues reported  Goal of Therapy:  Heparin level 0.3-0.7 units/ml Monitor platelets by anticoagulation protocol: Yes   Plan:  Bolus heparin 1200 units iv once Increase infusion to 1850 units/hr Recheck HL in 6 hours Daily CBC/HL Monitor for bleeding  Luisa Hart, PharmD Clinical Pharmacist Pager # [phone]   01/30/2019, 3:06 PM

## 2019-01-30 NOTE — Progress Notes (Signed)
ANTICOAGULATION CONSULT NOTE - Follow Up Consult  Pharmacy Consult for Heparin Indication: pulmonary embolus and DVT  No Known Allergies  Patient Measurements: Height: 5\' 8"  (172.7 cm) Weight: 178 lb 8 oz (81 kg) IBW/kg (Calculated) : 68.4 Heparin Dosing Weight: TBW  Vital Signs: Temp: 98.2 F (36.8 C) (03/19 2000) Temp Source: Oral (03/19 2000) BP: 164/88 (03/19 1800) Pulse Rate: 95 (03/19 1800)  Labs: Recent Labs    01/28/19 1209 01/28/19 1210  01/29/19 0217  01/30/19 0247 01/30/19 1313 01/30/19 2153  HGB 16.5  --   --  15.4  --  15.0  --   --   HCT 49.8  --   --  47.3  --  47.6  --   --   PLT 260  --   --  215  --  261  --   --   APTT  --  38*  --   --   --   --   --   --   LABPROT  --  12.3  --   --   --   --   --   --   INR  --  0.9  --   --   --   --   --   --   HEPARINUNFRC  --   --    < >  --    < > 0.24* 0.27* 0.44  CREATININE 1.10  --   --  0.83  --  0.89  --   --    < > = values in this interval not displayed.    Estimated Creatinine Clearance: 87.5 mL/min (by C-G formula based on SCr of 0.89 mg/dL).   Medications:  Infusions:  . heparin 1,850 Units/hr (01/30/19 1800)   Assessment: 35 yoM with no significant PMH presents on 3/17 with hemoptysis, left side chest pain x 5 days.  CTAngio = + acute PE with evidence of right heart strain.  [place] + Bilateral LE acute DVT.  Pharmacy consulted to dose IV heparin. No prior anticoagulation  Today, 01/30/2019:  Heparin level 0.24, now sub- therapeutic  CBC:  Hgb and Plt WNL  No bleeding or complications noted.  Today 01/30/19 11:27 PM   HL is 0.44, therapeutic   No bleeding/line issues reported per RN   Goal of Therapy:  Heparin level 0.3-0.7 units/ml Monitor platelets by anticoagulation protocol: Yes   Plan:   Continue heparin infusion at 1850 units/hr  Daily CBC/HL  Monitor for bleeding   Adalberto Cole, PharmD, BCPS Pager [phone] 01/30/2019 11:27 PM

## 2019-01-30 NOTE — Progress Notes (Signed)
PROGRESS NOTE    Cody Porter  XBJ:[id number] DOB: [birth year] DOA: 01/28/2019 PCP: Patient, No Pcp Per  Brief Narrative:  58 year old with past medical history relevant for tobacco abuse, alcohol abuse admitted with acute hypoxic respiratory failure due to submassive PE and bilateral lower extremity DVT with course complicated by runs of atrial fibrillation/SVT.   Assessment & Plan:   Active Problems:   ETOH abuse   Pulmonary embolus (HCC)   Leg DVT (deep venous thromboembolism), acute, bilateral (HCC)   Acute pulmonary embolism (HCC)   #) Acute hypoxic respiratory failure due to submassive PE/bilateral DVT: Improving -Echo 01/29/2019 shows normal EF -Continue heparin drip, will transition to novel oral anticoagulation after 48 hours -Discussed with pulmonary critical care, no need for catheter directed thrombolysis  #) Paroxysmal atrial fibrillation/SVT: Likely secondary to submassive PE.  Patient continues to have runs of SVT/A. fib -Echo essentially normal -Continue heparin drip, patient will need anticoagulation regardless of his chads VaSc score -Increase metoprolol tartrate to 25 mg every 6 hours  #) Tobacco abuse: -Counseling provided -Continue nicotine patch  #) Alcohol abuse: -CIWA protocol -Continue thiamine and folate supplementation  Fluids: Tolerating p.o. Electrolytes: Monitor and supplement Nutrition: Regular diet  Prophylaxis: Heparin drip  Disposition: Pending heart rate stabilization  Full code    Consultants:   Pulmonary critical care  Procedures:  Echo 01/29/2019:   1. The left ventricle has normal systolic function with an ejection fraction of 60-65%. The cavity size was normal. Left ventricular diastolic Doppler parameters are consistent with impaired relaxation.  2. The mitral valve is grossly normal.  3. The tricuspid valve is grossly normal.  4. The aortic valve is grossly normal.  5. The interatrial septum was not  assessed.    Antimicrobials:   none    Subjective: This morning the patient reports he is doing somewhat better.  He denies any chest pain, palpitations, shortness of breath, nausea, vomiting, diarrhea.  Per discussion with the patient's bedside nurse overnight patient had runs of SVT with even minimal exertion.  Objective: Vitals:   01/30/19 0700 01/30/19 0730 01/30/19 0745 01/30/19 0800  BP: (!) 160/98 (!) 157/88 (!) 179/108   Pulse: 80 96 86   Resp: 19 (!) 23 17   Temp:    98.9 F (37.2 C)  TempSrc:    Axillary  SpO2: 97% 97% 96%   Weight:      Height:        Intake/Output Summary (Last 24 hours) at 01/30/2019 0841 Last data filed at 01/30/2019 0600 Gross per 24 hour  Intake 329.7 ml  Output 750 ml  Net -420.3 ml   Filed Weights   01/28/19 1538  Weight: 81 kg    Examination:  General exam: Appears calm and comfortable  Respiratory system: Clear to auscultation. Respiratory effort normal.  Diminished lung sounds at bases Cardiovascular system: Distant heart sounds, regular rate and rhythm, no murmurs Gastrointestinal system: Abdomen is nondistended, soft and nontender. No organomegaly or masses felt. Normal bowel sounds heard. Central nervous system: Alert and oriented.  Grossly intact, moving all extremities Extremities: Trace lower extremity edema, right greater than left Skin: No rashes over visible skin Psychiatry: Judgement and insight appear normal. Mood & affect appropriate.     Data Reviewed: I have personally reviewed following labs and imaging studies  CBC: Recent Labs  Lab 01/28/19 1209 01/29/19 0217 01/30/19 0247  WBC 10.5 11.6* 10.6*  HGB 16.5 15.4 15.0  HCT 49.8 47.3 47.6  MCV 101.2* 103.7*  103.7*  PLT 260 215 261   Basic Metabolic Panel: Recent Labs  Lab 01/28/19 1209 01/29/19 0217 01/30/19 0247  NA 133* 134* 134*  K 4.4 4.1 3.9  CL 101 105 101  CO2 20* 15* 18*  GLUCOSE 114* 104* 102*  BUN 17 14 17   CREATININE 1.10 0.83 0.89   CALCIUM 9.3 8.5* 8.7*  MG  --  2.4  --   PHOS  --  2.9  --    GFR: Estimated Creatinine Clearance: 87.5 mL/min (by C-G formula based on SCr of 0.89 mg/dL). Liver Function Tests: Recent Labs  Lab 01/29/19 0217  AST 19  ALT 14  ALKPHOS 61  BILITOT 1.2  PROT 7.2  ALBUMIN 3.2*   No results for input(s): LIPASE, AMYLASE in the last 168 hours. No results for input(s): AMMONIA in the last 168 hours. Coagulation Profile: Recent Labs  Lab 01/28/19 1210  INR 0.9   Cardiac Enzymes: No results for input(s): CKTOTAL, CKMB, CKMBINDEX, TROPONINI in the last 168 hours. BNP (last 3 results) No results for input(s): PROBNP in the last 8760 hours. HbA1C: No results for input(s): HGBA1C in the last 72 hours. CBG: No results for input(s): GLUCAP in the last 168 hours. Lipid Profile: No results for input(s): CHOL, HDL, LDLCALC, TRIG, CHOLHDL, LDLDIRECT in the last 72 hours. Thyroid Function Tests: No results for input(s): TSH, T4TOTAL, FREET4, T3FREE, THYROIDAB in the last 72 hours. Anemia Panel: No results for input(s): VITAMINB12, FOLATE, FERRITIN, TIBC, IRON, RETICCTPCT in the last 72 hours. Sepsis Labs: No results for input(s): PROCALCITON, LATICACIDVEN in the last 168 hours.  Recent Results (from the past 240 hour(s))  MRSA PCR Screening     Status: None   Collection Time: 01/28/19 11:55 PM  Result Value Ref Range Status   MRSA by PCR NEGATIVE NEGATIVE Final    Comment:        The GeneXpert MRSA Assay (FDA approved for NASAL specimens only), is one component of a comprehensive MRSA colonization surveillance program. It is not intended to diagnose MRSA infection nor to guide or monitor treatment for MRSA infections. Performed at [facility], [address], [place], [place] [address]          Radiology Studies: Dg Chest 2 View  Result Date: 01/28/2019 CLINICAL DATA:  LEFT-sided chest pain for 5 days. EXAM: CHEST - 2 VIEW COMPARISON:  09/19/2008.  FINDINGS: Suspected lingular infiltrate/atelectasis, with volume loss in the LEFT hemithorax. No effusion. No pneumothorax. RIGHT lung clear. Heart size normal. No osseous findings. IMPRESSION: Suspected lingular infiltrate/atelectasis, with volume loss in the LEFT hemithorax. Electronically Signed   By: Elsie Stain M.D.   On: 01/28/2019 12:33   Ct Angio Chest Pe W/cm &/or Wo Cm  Result Date: 01/28/2019 CLINICAL DATA:  Chest pain EXAM: CT ANGIOGRAPHY CHEST WITH CONTRAST TECHNIQUE: Multidetector CT imaging of the chest was performed using the standard protocol during bolus administration of intravenous contrast. Multiplanar CT image reconstructions and MIPs were obtained to evaluate the vascular anatomy. CONTRAST:  34mL [id number] COMPARISON:  Chest film from earlier in the same FINDINGS: Cardiovascular: Atherosclerotic calcifications of the thoracic aorta are noted without aneurysmal dilatation or dissection. No cardiac enlargement is noted. There are changes suggestive of mild right heart strain within RV/LV ratio of 1. The pulmonary artery demonstrates bilateral pulmonary emboli throughout the entire arterial system. Mediastinum/Nodes: The esophagus shows evidence of a small sliding-type hiatal hernia. The thoracic inlet is within normal limits. No sizable hilar or mediastinal  adenopathy is identified. Lungs/Pleura: Mild infiltrate is noted within the left lingula. This corresponds to the area of abnormality on recent chest x-ray and likely in part related to the known pulmonary embolism. No sizable effusion is seen. Mild emphysematous changes are noted. No sizable parenchymal nodules are seen. Upper Abdomen: Visualized upper abdomen is within normal limits. Musculoskeletal: Degenerative changes of the thoracic spine are seen. No compression deformities are noted. Review of the MIP images confirms the above findings. IMPRESSION: Positive for acute PE with CT evidence of right heart strain (RV/LV Ratio = 1)  consistent with at least submassive (intermediate risk) PE. The presence of right heart strain has been associated with an increased risk of morbidity and mortality. Please activate Code PE by paging [phone]. Mild lingular infiltrate which is likely related to the underlying pulmonary embolism. Aortic Atherosclerosis (ICD10-I70.0) and Emphysema (ICD10-J43.9). Critical Value/emergent results were called by telephone at the time of interpretation on 01/28/2019 at 3:10 pm to [facility] , who verbally acknowledged these results. Electronically Signed   By: Alcide Clever M.D.   On: 01/28/2019 15:13   Vas [place] Lower Extremity Venous (dvt) (only [facility])  Result Date: 01/29/2019  Lower Venous Study Indications: Edema, SOB, and pulmonary embolism.  Risk Factors: Confirmed PE. Performing Technologist: Toma Deiters RVS  Examination Guidelines: A complete evaluation includes B-mode imaging, spectral Doppler, color Doppler, and power Doppler as needed of all accessible portions of each vessel. Bilateral testing is considered an integral part of a complete examination. Limited examinations for reoccurring indications may be performed as noted.  Right Venous Findings: +---------+---------------+---------+-----------+----------+--------------+            Compressibility Phasicity Spontaneity Properties Summary         +---------+---------------+---------+-----------+----------+--------------+  CFV       Full            Yes                                              +---------+---------------+---------+-----------+----------+--------------+  SFJ       Full                                                             +---------+---------------+---------+-----------+----------+--------------+  FV Prox   Full            Yes       Yes                                    +---------+---------------+---------+-----------+----------+--------------+  FV Mid    None            No        No                                      +---------+---------------+---------+-----------+----------+--------------+  FV Distal None            No        No                                     +---------+---------------+---------+-----------+----------+--------------+  PFV       Full            Yes       Yes                                    +---------+---------------+---------+-----------+----------+--------------+  POP       None                                                             +---------+---------------+---------+-----------+----------+--------------+  PTV       Partial                                          distal region   +---------+---------------+---------+-----------+----------+--------------+  PERO                                                       Not visualized  +---------+---------------+---------+-----------+----------+--------------+  SSV       Partial                                                          +---------+---------------+---------+-----------+----------+--------------+ Unable to image the iliac veins  Left Venous Findings: +---------+---------------+---------+-----------+----------+-------------------+            Compressibility Phasicity Spontaneity Properties Summary              +---------+---------------+---------+-----------+----------+-------------------+  CFV       Partial         Yes       Yes                    mobile in the mid                                                                region at the sfj    +---------+---------------+---------+-----------+----------+-------------------+  SFJ       Partial                                          mobile               +---------+---------------+---------+-----------+----------+-------------------+  FV Prox   None                                                                  +---------+---------------+---------+-----------+----------+-------------------+  FV Mid    None                                                                   +---------+---------------+---------+-----------+----------+-------------------+  FV Distal None                                                                  +---------+---------------+---------+-----------+----------+-------------------+  PFV       Full            Yes       Yes                                         +---------+---------------+---------+-----------+----------+-------------------+  POP       Partial                                          minimal flow         +---------+---------------+---------+-----------+----------+-------------------+  PTV       Full                                                                  +---------+---------------+---------+-----------+----------+-------------------+  PERO                                                       not visualized due                                                               to edema             +---------+---------------+---------+-----------+----------+-------------------+ Unable to visualize the iliac veins    Summary: Right: Findings consistent with acute deep vein thrombosis involving the right femoral vein, right popliteal vein, right posterior tibial vein, and right gastrocnemius vein. See summaru comnets listed above Left: Findings consistent with acute deep vein thrombosis involving the left common femoral vein, left femoral vein, and left popliteal vein. See summary comments listed above  *See table(s) above for measurements and observations. Electronically signed by Gretta Began MD on 01/29/2019 at 5:02:17 PM.    Final         Scheduled Meds:  chlorhexidine  15 mL Mouth Rinse BID   Chlorhexidine Gluconate Cloth  6 each Topical Daily  folic acid  1 mg Intravenous Daily   mouth rinse  15 mL Mouth Rinse q12n4p   metoprolol tartrate  25 mg Oral Q6H   thiamine injection  100 mg Intravenous Daily   Continuous Infusions:  heparin 1,700 Units/hr (01/30/19 0647)     LOS: 2 days    Time spent:  35    Delaine Lame, MD [facility]  If 7PM-7AM, please contact night-coverage [email or web address] Password [facility] 01/30/2019, 8:41 AM

## 2019-01-30 NOTE — Progress Notes (Signed)
ANTICOAGULATION CONSULT NOTE - Follow Up Consult  Pharmacy Consult for Heparin Indication: pulmonary embolus and DVT  No Known Allergies  Patient Measurements: Height: 5\' 8"  (172.7 cm) Weight: 178 lb 8 oz (81 kg) IBW/kg (Calculated) : 68.4 Heparin Dosing Weight: TBW  Vital Signs: Temp: 98.4 F (36.9 C) (03/19 0400) Temp Source: Oral (03/19 0400) BP: 169/99 (03/19 0503) Pulse Rate: 91 (03/19 0503)  Labs: Recent Labs    01/28/19 1209 01/28/19 1210  01/29/19 0217 01/29/19 0805 01/29/19 1402 01/30/19 0247  HGB 16.5  --   --  15.4  --   --  15.0  HCT 49.8  --   --  47.3  --   --  47.6  PLT 260  --   --  215  --   --  261  APTT  --  38*  --   --   --   --   --   LABPROT  --  12.3  --   --   --   --   --   INR  --  0.9  --   --   --   --   --   HEPARINUNFRC  --   --    < >  --  0.43 0.42 0.24*  CREATININE 1.10  --   --  0.83  --   --  0.89   < > = values in this interval not displayed.    Estimated Creatinine Clearance: 87.5 mL/min (by C-G formula based on SCr of 0.89 mg/dL).   Medications:  Infusions:  . heparin 1,500 Units/hr (01/29/19 2343)   Assessment: 43 yoM with no significant PMH presents on 3/17 with hemoptysis, left side chest pain x 5 days.  CTAngio = + acute PE with evidence of right heart strain.  [place] + Bilateral LE acute DVT.  Pharmacy consulted to dose IV heparin. No prior anticoagulation  Today, 01/30/2019: Heparin level 0.24, now sub- therapeutic CBC:  Hgb and Plt WNL No bleeding or complications noted.  Goal of Therapy:  Heparin level 0.3-0.7 units/ml Monitor platelets by anticoagulation protocol: Yes   Plan:  Increase heparin IV infusion to 1700 units/hr Recheck Heparin level at 1400 today Daily heparin level and CBC Continue to monitor H&H and platelets Follow up long-term anticoagulation plans  Otho Bellows PharmD Pager [phone] 01/30/2019, 6:06 AM

## 2019-01-31 ENCOUNTER — Inpatient Hospital Stay (HOSPITAL_COMMUNITY): Payer: Self-pay

## 2019-01-31 LAB — TROPONIN I
Troponin I: 0.03 ng/mL (ref <0.03)
Troponin I: 0.03 ng/mL (ref <0.03)
Troponin I: <0.03 ng/mL (ref <0.03)

## 2019-01-31 LAB — BASIC METABOLIC PANEL
Anion gap: 10 (ref 5–15)
BUN: 15 mg/dL (ref 6–20)
CO2: 20 mmol/L — ABNORMAL LOW (ref 22–32)
Calcium: 8.3 mg/dL — ABNORMAL LOW (ref 8.9–10.3)
Chloride: 103 mmol/L (ref 98–111)
Creatinine, Ser: 1 mg/dL (ref 0.61–1.24)
GFR calc Af Amer: >60 mL/min (ref >60)
GFR calc non Af Amer: >60 mL/min (ref >60)
Glucose, Bld: 107 mg/dL — ABNORMAL HIGH (ref 70–99)
Sodium: 133 mmol/L — ABNORMAL LOW (ref 135–145)

## 2019-01-31 LAB — HEPARIN LEVEL (UNFRACTIONATED)
Heparin Unfractionated: 0.29 IU/mL — ABNORMAL LOW (ref 0.30–0.70)
Heparin Unfractionated: 0.62 IU/mL (ref 0.30–0.70)

## 2019-01-31 LAB — CBC
HCT: 45.6 % (ref 39.0–52.0)
Hemoglobin: 14.5 g/dL (ref 13.0–17.0)
MCH: 32.9 pg (ref 26.0–34.0)
MCHC: 31.8 g/dL (ref 30.0–36.0)
MCV: 103.4 fL — ABNORMAL HIGH (ref 80.0–100.0)
Platelets: 279 K/uL (ref 150–400)
RBC: 4.41 MIL/uL (ref 4.22–5.81)
RDW: 12.8 % (ref 11.5–15.5)
WBC: 10.6 10*3/uL — ABNORMAL HIGH (ref 4.0–10.5)
nRBC: 0 % (ref 0.0–0.2)

## 2019-01-31 LAB — GLUCOSE, CAPILLARY: Glucose-Capillary: 108 mg/dL — ABNORMAL HIGH (ref 70–99)

## 2019-01-31 LAB — MAGNESIUM: Magnesium: 2.1 mg/dL (ref 1.7–2.4)

## 2019-01-31 LAB — BASIC METABOLIC PANEL WITH GFR: Potassium: 3.9 mmol/L (ref 3.5–5.1)

## 2019-01-31 MED ORDER — DILTIAZEM HCL 25 MG/5ML IV SOLN
10.0000 mg | Freq: Once | INTRAVENOUS | Status: AC
  Administered 2019-01-31: 10 mg via INTRAVENOUS
  Filled 2019-01-31: qty 5

## 2019-01-31 MED ORDER — ENOXAPARIN SODIUM 80 MG/0.8ML ~~LOC~~ SOLN
80.0000 mg | Freq: Two times a day (BID) | SUBCUTANEOUS | Status: DC
  Administered 2019-01-31 – 2019-02-01 (×3): 80 mg via SUBCUTANEOUS
  Filled 2019-01-31 (×5): qty 0.8

## 2019-01-31 MED ORDER — DEXMEDETOMIDINE HCL IN NACL 200 MCG/50ML IV SOLN
0.2000 ug/kg/h | INTRAVENOUS | Status: DC
  Administered 2019-01-31: 0.5 ug/kg/h via INTRAVENOUS
  Administered 2019-01-31 (×2): 0.7 ug/kg/h via INTRAVENOUS
  Administered 2019-01-31: 0.2 ug/kg/h via INTRAVENOUS
  Administered 2019-01-31: 0.7 ug/kg/h via INTRAVENOUS
  Administered 2019-01-31: 0.5 ug/kg/h via INTRAVENOUS
  Administered 2019-02-01: 0.3 ug/kg/h via INTRAVENOUS
  Filled 2019-01-31 (×6): qty 50

## 2019-01-31 MED ORDER — SODIUM CHLORIDE 0.9 % IV BOLUS
500.0000 mL | Freq: Once | INTRAVENOUS | Status: AC
  Administered 2019-01-31: 500 mL via INTRAVENOUS

## 2019-01-31 MED ORDER — HEPARIN BOLUS VIA INFUSION
1200.0000 [IU] | Freq: Once | INTRAVENOUS | Status: AC
  Administered 2019-01-31: 1200 [IU] via INTRAVENOUS
  Filled 2019-01-31: qty 1200

## 2019-01-31 MED ORDER — LORAZEPAM 2 MG/ML IJ SOLN
1.0000 mg | INTRAMUSCULAR | Status: DC | PRN
  Filled 2019-01-31 (×2): qty 1

## 2019-01-31 NOTE — Progress Notes (Signed)
PROGRESS NOTE    Cody Porter  HYQ:[id number] DOB: 07/10/1961 DOA: 01/28/2019 PCP: Patient, No Pcp Per  Brief Narrative:  58 year old with past medical history relevant for tobacco abuse, alcohol abuse admitted with acute hypoxic respiratory failure due to submassive PE and bilateral lower extremity DVT with course complicated by runs of atrial fibrillation/SVT.   Assessment & Plan:   Active Problems:   ETOH abuse   Pulmonary embolus (HCC)   Leg DVT (deep venous thromboembolism), acute, bilateral (HCC)   Acute pulmonary embolism (HCC)  #) Acute metabolic encephalopathy likely due to alcohol abuse: Overnight patient became increasingly agitated and encephalopathic.  He was quite confused.  Patient initially was given additional bolus of lorazepam as he was noted to have an elevated CIWA score but then subsequently required a maximum dose of dexmedetomidine and subsequently a dose of morphine.  He was also placed in restraints. -Stat head CT as patient is on heparin drip -CIWA protocol -Continue thiamine and folate supplementation  #) Acute hypoxic respiratory failure due to submassive PE/bilateral DVT: Essentially stable -Echo 01/29/2019 shows normal EF -Continue heparin drip -Discussed with pulmonary critical care, no need for catheter directed thrombolysis  #) Paroxysmal atrial fibrillation/SVT: Likely secondary to submassive PE.  Patient continues to have runs of SVT/A. fib -Echo essentially normal -Continue heparin drip, patient will need anticoagulation regardless of his chads VaSc score -Continue metoprolol tartrate to 25 mg every 6 hours  #) Tobacco abuse: -Counseling provided -Continue nicotine patch  Fluids: Tolerating p.o. Electrolytes: Monitor and supplement Nutrition: Regular diet  Prophylaxis: Heparin drip  Disposition: Pending resolution of acute metabolic encephalopathy and stabilization of heart rate  Full code    Consultants:   Pulmonary critical  care  Procedures:  Echo 01/29/2019:   1. The left ventricle has normal systolic function with an ejection fraction of 60-65%. The cavity size was normal. Left ventricular diastolic Doppler parameters are consistent with impaired relaxation.  2. The mitral valve is grossly normal.  3. The tricuspid valve is grossly normal.  4. The aortic valve is grossly normal.  5. The interatrial septum was not assessed.    Antimicrobials:   none    Subjective: This morning patient is sleeping in bed.  Overnight patient had episodes of agitation and confusion.  It was thought the patient was withdrawing and patient subsequently required lorazepam, morphine, dexmedetomidine infusion.  A stat head CT this morning is pending.  Objective: Vitals:   01/31/19 0500 01/31/19 0530 01/31/19 0600 01/31/19 0700  BP: 138/87  (!) 143/79 112/70  Pulse: 95 98 100 80  Resp: (!) 21  (!) 23 17  Temp:      TempSrc:      SpO2: 91%  (!) 89% 94%  Weight:      Height:        Intake/Output Summary (Last 24 hours) at 01/31/2019 0818 Last data filed at 01/30/2019 2241 Gross per 24 hour  Intake 665.91 ml  Output 375 ml  Net 290.91 ml   Filed Weights   01/28/19 1538  Weight: 81 kg    Examination:  General exam: Appears calm and comfortable  Respiratory system: Clear to auscultation. Respiratory effort normal.  Diminished lung sounds at bases Cardiovascular system: Distant heart sounds, regular rate and rhythm, no murmurs Gastrointestinal system: Abdomen is nondistended, soft and nontender. No organomegaly or masses felt. Normal bowel sounds heard. Central nervous system: Arousable but not alert or oriented, grossly intact, moving all extremities, pinpoint pupils Extremities: Trace lower extremity  edema, right greater than left Skin: No rashes over visible skin Psychiatry: Unable to assess due to medical condition    Data Reviewed: I have personally reviewed following labs and imaging studies  CBC:  Recent Labs  Lab 01/28/19 1209 01/29/19 0217 01/30/19 0247 01/31/19 0157  WBC 10.5 11.6* 10.6* 10.6*  HGB 16.5 15.4 15.0 14.5  HCT 49.8 47.3 47.6 45.6  MCV 101.2* 103.7* 103.7* 103.4*  PLT 260 215 261 279   Basic Metabolic Panel: Recent Labs  Lab 01/28/19 1209 01/29/19 0217 01/30/19 0247 01/31/19 0157  NA 133* 134* 134* 133*  K 4.4 4.1 3.9 3.9  CL 101 105 101 103  CO2 20* 15* 18* 20*  GLUCOSE 114* 104* 102* 107*  BUN 17 14 17 15   CREATININE 1.10 0.83 0.89 1.00  CALCIUM 9.3 8.5* 8.7* 8.3*  MG  --  2.4  --  2.1  PHOS  --  2.9  --   --    GFR: Estimated Creatinine Clearance: 77.9 mL/min (by C-G formula based on SCr of 1 mg/dL). Liver Function Tests: Recent Labs  Lab 01/29/19 0217  AST 19  ALT 14  ALKPHOS 61  BILITOT 1.2  PROT 7.2  ALBUMIN 3.2*   No results for input(s): LIPASE, AMYLASE in the last 168 hours. No results for input(s): AMMONIA in the last 168 hours. Coagulation Profile: Recent Labs  Lab 01/28/19 1210  INR 0.9   Cardiac Enzymes: Recent Labs  Lab 01/31/19 0157  TROPONINI 0.03*   BNP (last 3 results) No results for input(s): PROBNP in the last 8760 hours. HbA1C: No results for input(s): HGBA1C in the last 72 hours. CBG: No results for input(s): GLUCAP in the last 168 hours. Lipid Profile: No results for input(s): CHOL, HDL, LDLCALC, TRIG, CHOLHDL, LDLDIRECT in the last 72 hours. Thyroid Function Tests: No results for input(s): TSH, T4TOTAL, FREET4, T3FREE, THYROIDAB in the last 72 hours. Anemia Panel: No results for input(s): VITAMINB12, FOLATE, FERRITIN, TIBC, IRON, RETICCTPCT in the last 72 hours. Sepsis Labs: No results for input(s): PROCALCITON, LATICACIDVEN in the last 168 hours.  Recent Results (from the past 240 hour(s))  MRSA PCR Screening     Status: None   Collection Time: 01/28/19 11:55 PM  Result Value Ref Range Status   MRSA by PCR NEGATIVE NEGATIVE Final    Comment:        The GeneXpert MRSA Assay (FDA approved for  NASAL specimens only), is one component of a comprehensive MRSA colonization surveillance program. It is not intended to diagnose MRSA infection nor to guide or monitor treatment for MRSA infections. Performed at [facility], [address] [place], [place] [address]          Radiology Studies: No results found.      Scheduled Meds: . chlorhexidine  15 mL Mouth Rinse BID  . Chlorhexidine Gluconate Cloth  6 each Topical Daily  . folic acid  1 mg Intravenous Daily  . mouth rinse  15 mL Mouth Rinse q12n4p  . metoprolol tartrate  25 mg Oral Q6H  . thiamine injection  100 mg Intravenous Daily   Continuous Infusions: . dexmedetomidine (PRECEDEX) IV infusion 0.7 mcg/kg/hr (01/31/19 0703)  . heparin 2,000 Units/hr (01/31/19 0705)     LOS: 3 days    Time spent: 35    Delaine Lame, MD [facility]  If 7PM-7AM, please contact night-coverage [email or web address] Password [facility] 01/31/2019, 8:18 AM

## 2019-01-31 NOTE — Progress Notes (Signed)
Pt arouses, squeezed hands, asks to go home, falls asleep again.  Resting quietly.

## 2019-01-31 NOTE — Progress Notes (Signed)
ANTICOAGULATION CONSULT NOTE - Follow Up Consult  Pharmacy Consult for Heparin Indication: pulmonary embolus and DVT  No Known Allergies  Patient Measurements: Height: 5\' 8"  (172.7 cm) Weight: 178 lb 8 oz (81 kg) IBW/kg (Calculated) : 68.4 Heparin Dosing Weight: TBW  Vital Signs: Temp: 98 F (36.7 C) (03/20 0437) Temp Source: Oral (03/20 0437) BP: 112/70 (03/20 0700) Pulse Rate: 80 (03/20 0700)  Labs: Recent Labs    01/28/19 1210  01/29/19 0217  01/30/19 0247 01/30/19 1313 01/30/19 2153 01/31/19 0157 01/31/19 0300  HGB  --   --  15.4  --  15.0  --   --  14.5  --   HCT  --   --  47.3  --  47.6  --   --  45.6  --   PLT  --   --  215  --  261  --   --  279  --   APTT 38*  --   --   --   --   --   --   --   --   LABPROT 12.3  --   --   --   --   --   --   --   --   INR 0.9  --   --   --   --   --   --   --   --   HEPARINUNFRC  --    < >  --    < > 0.24* 0.27* 0.44  --  0.29*  CREATININE  --   --  0.83  --  0.89  --   --  1.00  --   TROPONINI  --   --   --   --   --   --   --  0.03*  --    < > = values in this interval not displayed.    Estimated Creatinine Clearance: 77.9 mL/min (by C-G formula based on SCr of 1 mg/dL).   Medications:  Infusions:  . dexmedetomidine (PRECEDEX) IV infusion 0.7 mcg/kg/hr (01/31/19 0703)  . heparin 2,000 Units/hr (01/31/19 3734)   Assessment: 48 yoM with no significant PMH presents on 3/17 with hemoptysis, left side chest pain x 5 days.  CTAngio = + acute PE with evidence of right heart strain.  [place] + Bilateral LE acute DVT. ECHO without RV dysfunction. Pharmacy consulted to dose IV heparin. No prior anticoagulation  Today, 01/31/2019:  Heparin level 0.29, subtherapeutic  CBC:  Hgb and Plt WNL  No bleeding or complications noted  Goal of Therapy:  Heparin level 0.3-0.7 units/ml Monitor platelets by anticoagulation protocol: Yes   Plan:   Bolus heparin 1200 units IV x1  Increase heparin infusion to 2000 units/hr  6-hour  heparin level at 1300  Daily heparin level and CBC  Continue to monitor H&H and platelets   Lawernce Keas P4 Pharmacy Student 01/31/2019 7:50 AM

## 2019-01-31 NOTE — Progress Notes (Signed)
Pts mother called, updated pts mother as to pt agitation and plan to keep pt calm, pts mother voiced understanding.

## 2019-01-31 NOTE — Progress Notes (Signed)
Pt has become increasingly agitated over past three hours trying to get out of bed.  Asking to put his clothes on and go to the refrigerator.  Trying to pull out IVs, monitor.  Cody Porter notified.  Precedex ordered, restratins ordered.

## 2019-01-31 NOTE — Progress Notes (Signed)
Pt continues to be agitated, precedex maxed.  Stevie Kern NP notified.  Morphine given for his discomfort from PE.  New Ivs started as he had pulled one out.   Bladder scan 263 mls

## 2019-01-31 NOTE — Progress Notes (Signed)
PT Cancellation Note  Patient Details Name: Cody Porter MRN: [id number] DOB: Nov 08, 1961   Cancelled Treatment:    Reason Eval/Treat Not Completed: Medical issues which prohibited therapy; defer per RN today, check back as schedule permits   [facility] 01/31/2019, 2:34 PM

## 2019-01-31 NOTE — Progress Notes (Signed)
ANTICOAGULATION CONSULT NOTE - Follow Up Consult  Pharmacy Consult for Lovenox Indication: atrial fibrillation, pulmonary embolus and DVT  No Known Allergies  Patient Measurements: Height: 5\' 8"  (172.7 cm) Weight: 178 lb 8 oz (81 kg) IBW/kg (Calculated) : 68.4 Heparin Dosing Weight: TBW  Vital Signs: Temp: 97 F (36.1 C) (03/20 1200) Temp Source: Axillary (03/20 1200) BP: 145/91 (03/20 1339) Pulse Rate: 58 (03/20 1339)  Labs: Recent Labs    01/29/19 0217  01/30/19 0247  01/30/19 2153 01/31/19 0157 01/31/19 0300 01/31/19 0804 01/31/19 1358  HGB 15.4  --  15.0  --   --  14.5  --   --   --   HCT 47.3  --  47.6  --   --  45.6  --   --   --   PLT 215  --  261  --   --  279  --   --   --   HEPARINUNFRC  --    < > 0.24*   < > 0.44  --  0.29*  --  0.62  CREATININE 0.83  --  0.89  --   --  1.00  --   --   --   TROPONINI  --   --   --   --   --  0.03*  --  0.03* <0.03   < > = values in this interval not displayed.    Estimated Creatinine Clearance: 77.9 mL/min (by C-G formula based on SCr of 1 mg/dL).   Medications:  Infusions:  . dexmedetomidine (PRECEDEX) IV infusion 0.7 mcg/kg/hr (01/31/19 1439)  . heparin 2,000 Units/hr (01/31/19 0800)   Assessment: 59 yoM with no significant PMH presents on 3/17 with hemoptysis, left side chest pain x 5 days.  CTAngio = + acute PE with evidence of right heart strain.  [place] + Bilateral LE acute DVT. ECHO without RV dysfunction.  No prior anticoagulation  Today, 01/31/2019:  Heparin level 0.62, therapeutic on heparin 2000 unit/hr  CBC:  Hgb and Plt WNL  SCr 1, CrCl > 30 ml/min  No bleeding or complications noted  Goal of Therapy:  Anti-Xa level 0.6-1 units/ml 4hrs after LMWH dose given Monitor platelets by anticoagulation protocol: Yes   Plan:   Stop Heparin at 21:00  Start Lovenox 80 mg [place] q12h - first dose at 22:00  Continue to monitor H&H and platelets   Lynann Beaver PharmD, BCPS Pager [phone] 01/31/2019 2:58  PM

## 2019-02-01 LAB — GLUCOSE, CAPILLARY: Glucose-Capillary: 99 mg/dL (ref 70–99)

## 2019-02-01 LAB — CBC
HCT: 46.9 % (ref 39.0–52.0)
Hemoglobin: 14.5 g/dL (ref 13.0–17.0)
MCH: 33 pg (ref 26.0–34.0)
MCHC: 30.9 g/dL (ref 30.0–36.0)
MCV: 106.6 fL — ABNORMAL HIGH (ref 80.0–100.0)
Platelets: 307 10*3/uL (ref 150–400)
RBC: 4.4 MIL/uL (ref 4.22–5.81)
RDW: 12.6 % (ref 11.5–15.5)
WBC: 7.8 10*3/uL (ref 4.0–10.5)
nRBC: 0 % (ref 0.0–0.2)

## 2019-02-01 LAB — COMPREHENSIVE METABOLIC PANEL
AST: 17 U/L (ref 15–41)
Albumin: 2.6 g/dL — ABNORMAL LOW (ref 3.5–5.0)
Alkaline Phosphatase: 52 U/L (ref 38–126)
Anion gap: 9 (ref 5–15)
BUN: 11 mg/dL (ref 6–20)
CO2: 20 mmol/L — ABNORMAL LOW (ref 22–32)
Calcium: 8.6 mg/dL — ABNORMAL LOW (ref 8.9–10.3)
Chloride: 107 mmol/L (ref 98–111)
GFR calc Af Amer: >60 mL/min (ref >60)
GFR calc non Af Amer: >60 mL/min (ref >60)
Glucose, Bld: 102 mg/dL — ABNORMAL HIGH (ref 70–99)
Potassium: 3.9 mmol/L (ref 3.5–5.1)
Sodium: 136 mmol/L (ref 135–145)
Total Bilirubin: 0.7 mg/dL (ref 0.3–1.2)
Total Protein: 6.3 g/dL — ABNORMAL LOW (ref 6.5–8.1)

## 2019-02-01 LAB — COMPREHENSIVE METABOLIC PANEL WITH GFR
ALT: 15 U/L (ref 0–44)
Creatinine, Ser: 0.83 mg/dL (ref 0.61–1.24)

## 2019-02-01 LAB — MAGNESIUM: Magnesium: 2.2 mg/dL (ref 1.7–2.4)

## 2019-02-01 MED ORDER — METOPROLOL TARTRATE 25 MG PO TABS
25.0000 mg | ORAL_TABLET | Freq: Two times a day (BID) | ORAL | Status: DC
  Administered 2019-02-01 – 2019-02-03 (×4): 25 mg via ORAL
  Filled 2019-02-01 (×4): qty 1

## 2019-02-01 NOTE — Evaluation (Signed)
Physical Therapy Evaluation Patient Details Name: Cody Porter MRN: [id number] DOB: 02-29-1961 Today's Date: 02/01/2019   History of Present Illness  58 year old with past medical history relevant for tobacco abuse, alcohol abuse admitted with acute hypoxic respiratory failure due to submassive PE and bilateral lower extremity DVT with course complicated by runs of atrial fibrillation/SVT., restlessness, requiring Precedex.  Clinical Impression  The patient is  Mobilizing with RW. Ambulated x 60' on RA with sats drop to 89%, RR 30. Patient lives with elderly mother. Pt admitted with above diagnosis. Pt currently with functional limitations due to the deficits listed below (see PT Problem List).  Pt will benefit from skilled PT to increase their independence and safety with mobility to allow discharge to the venue listed below.       Follow Up Recommendations Home health PT    Equipment Recommendations  (TBD)    Recommendations for Other Services   OT    Precautions / Restrictions Precautions Precautions: Fall Precaution Comments: monitor sats, PE      Mobility  Bed Mobility Overal bed mobility: Needs Assistance Bed Mobility: Supine to Sit     Supine to sit: Supervision     General bed mobility comments: extra time to  mobilize  Transfers Overall transfer level: Needs assistance Equipment used: Rolling walker (2 wheeled) Transfers: Sit to/from Stand Sit to Stand: Min assist         General transfer comment: steady assuist to stand, slightly trembling in legs initially  Ambulation/Gait Ambulation/Gait assistance: Min assist Gait Distance (Feet): 60 Feet Assistive device: Rolling walker (2 wheeled) Gait Pattern/deviations: Step-through pattern Gait velocity: decr   General Gait Details: slightly unsteady.   Stairs            Wheelchair Mobility    Modified Rankin (Stroke Patients Only)       Balance Overall balance assessment: Needs  assistance Sitting-balance support: Feet supported;Bilateral upper extremity supported Sitting balance-Leahy Scale: Fair     Standing balance support: During functional activity;Bilateral upper extremity supported Standing balance-Leahy Scale: Fair                               Pertinent Vitals/Pain Pain Assessment: Faces Faces Pain Scale: Hurts even more Pain Location: left side sternal with deep breath Pain Descriptors / Indicators: Discomfort Pain Intervention(s): Monitored during session;Patient requesting pain meds-RN notified    Home Living Family/patient expects to be discharged to:: Private residence Living Arrangements: Parent Available Help at Discharge: Family Type of Home: House       Home Layout: Two level;Bed/bath upstairs Home Equipment: None      Prior Function Level of Independence: Independent         Comments: walks 2 miles a day     Hand Dominance        Extremity/Trunk Assessment   Upper Extremity Assessment Upper Extremity Assessment: Generalized weakness    Lower Extremity Assessment Lower Extremity Assessment: Generalized weakness    Cervical / Trunk Assessment Cervical / Trunk Assessment: Normal  Communication   Communication: No difficulties  Cognition Arousal/Alertness: Awake/alert Behavior During Therapy: WFL for tasks assessed/performed Overall Cognitive Status: Impaired/Different from baseline Area of Impairment: Orientation                 Orientation Level: Time                    General Comments  Exercises     Assessment/Plan    PT Assessment Patient needs continued PT services  PT Problem List Decreased strength;Decreased activity tolerance;Decreased knowledge of use of DME;Decreased safety awareness;Decreased knowledge of precautions;Decreased mobility;Cardiopulmonary status limiting activity       PT Treatment Interventions DME instruction;Gait training;Stair  training;Functional mobility training;Therapeutic activities;Patient/family education    PT Goals (Current goals can be found in the Care Plan section)  Acute Rehab PT Goals Patient Stated Goal: to go home PT Goal Formulation: With patient Time For Goal Achievement: 02/15/19 Potential to Achieve Goals: Good    Frequency Min 3X/week   Barriers to discharge Decreased caregiver support      Co-evaluation               AM-PAC PT "6 Clicks" Mobility  Outcome Measure Help needed turning from your back to your side while in a flat bed without using bedrails?: A Little Help needed moving from lying on your back to sitting on the side of a flat bed without using bedrails?: A Little Help needed moving to and from a bed to a chair (including a wheelchair)?: A Little Help needed standing up from a chair using your arms (e.g., wheelchair or bedside chair)?: A Little Help needed to walk in hospital room?: A Lot Help needed climbing 3-5 steps with a railing? : Total 6 Click Score: 15    End of Session   Activity Tolerance: Patient tolerated treatment well Patient left: in chair;with call bell/phone within reach;with chair alarm set Nurse Communication: Mobility status PT Visit Diagnosis: Unsteadiness on feet (R26.81);Pain    Time: 1558-1630 PT Time Calculation (min) (ACUTE ONLY): 32 min   Charges:   PT Evaluation $PT Eval Low Complexity: 1 Low PT Treatments $Gait Training: 8-22 mins        Blanchard Kelch PT Acute Rehabilitation Services Pager [phone] Office [phone]   Rada Hay 02/01/2019, 4:47 PM

## 2019-02-01 NOTE — Progress Notes (Signed)
Pt resting no longer interfering with care. Restraint d/c at this time. I will continue to monitor.

## 2019-02-01 NOTE — Progress Notes (Signed)
PROGRESS NOTE    Cody Porter  BJY:[id number] DOB: 1961/07/11 DOA: 01/28/2019 PCP: Patient, No Pcp Per  Brief Narrative:  58 year old with past medical history relevant for tobacco abuse, alcohol abuse admitted with acute hypoxic respiratory failure due to submassive PE and bilateral lower extremity DVT with course complicated by runs of atrial fibrillation/SVT.   Assessment & Plan:   Active Problems:   ETOH abuse   Pulmonary embolus (HCC)   Leg DVT (deep venous thromboembolism), acute, bilateral (HCC)   Acute pulmonary embolism (HCC)  #) Acute metabolic encephalopathy likely due to alcohol abuse: Resolved with initiation of dexmedetomidine. 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 -CIWA protocol -Continue thiamine and folate supplementation -Wean dexmedetomidine  #) Acute hypoxic respiratory failure due to submassive PE/bilateral DVT: Essentially stable -Echo 01/29/2019 shows normal EF -Transition to enoxaparin 1 mg/kg twice daily -Discussed with pulmonary critical care, no need for catheter directed thrombolysis  #) Paroxysmal atrial fibrillation/SVT: Patient is now borderline bradycardic to dexmedetomidine -Echo essentially normal -Continue enoxaparin -Continue metoprolol tartrate to 25 mg every 12  #) Tobacco abuse: -Counseling provided -Continue nicotine patch  Fluids: Tolerating p.o. Electrolytes: Monitor and supplement Nutrition: Regular diet  Prophylaxis: Heparin drip  Disposition: Resolution of withdrawal and further stability of heart rate  Full code    Consultants:   Pulmonary critical care  Procedures:  Echo 01/29/2019:   1. The left ventricle has normal systolic function with an ejection fraction of 60-65%. The cavity size was normal. Left ventricular diastolic Doppler parameters are consistent with impaired relaxation.  2. The mitral valve is grossly normal.  3. The tricuspid valve is grossly normal.  4. The aortic valve is grossly  normal.  5. The interatrial septum was not assessed.    Antimicrobials:   none    Subjective: This morning patient is sleeping in bed but easily arousable.  He does not report any pain anywhere.  He denies any nausea, vomiting, diarrhea, chest pain, shortness of breath.  He is requesting discharge.  Objective: Vitals:   02/01/19 0419 02/01/19 0500 02/01/19 0600 02/01/19 0800  BP:  (!) 128/59 (!) 143/75   Pulse:  (!) 57 (!) 56   Resp:  16 14   Temp: (!) 97.4 F (36.3 C)   97.6 F (36.4 C)  TempSrc: Axillary   Axillary  SpO2:  96% 97%   Weight:      Height:        Intake/Output Summary (Last 24 hours) at 02/01/2019 0923 Last data filed at 02/01/2019 0600 Gross per 24 hour  Intake 327.29 ml  Output 2525 ml  Net -2197.71 ml   Filed Weights   01/28/19 1538  Weight: 81 kg    Examination:  General exam: Appears calm and comfortable  Respiratory system: Clear to auscultation. Respiratory effort normal.  Diminished lung sounds at bases Cardiovascular system: Distant heart sounds, regular rate and rhythm, no murmurs Gastrointestinal system: Abdomen is nondistended, soft and nontender. No organomegaly or masses felt. Normal bowel sounds heard. Central nervous system: Alert and oriented, grossly intact, moving all extremities, Extremities: Trace lower extremity edema, right greater than left Skin: No rashes over visible skin Psychiatry: Judgment and insight are intact.    Data Reviewed: I have personally reviewed following labs and imaging studies  CBC: Recent Labs  Lab 01/28/19 1209 01/29/19 0217 01/30/19 0247 01/31/19 0157 02/01/19 0253  WBC 10.5 11.6* 10.6* 10.6* 7.8  HGB 16.5 15.4 15.0 14.5 14.5  HCT 49.8 47.3 47.6 45.6 46.9  MCV 101.2* 103.7* 103.7* 103.4* 106.6*  PLT 260 215 261 279 307   Basic Metabolic Panel: Recent Labs  Lab 01/28/19 1209 01/29/19 0217 01/30/19 0247 01/31/19 0157 02/01/19 0253  NA 133* 134* 134* 133* 136  K 4.4 4.1 3.9 3.9 3.9   CL 101 105 101 103 107  CO2 20* 15* 18* 20* 20*  GLUCOSE 114* 104* 102* 107* 102*  BUN 17 14 17 15 11   CREATININE 1.10 0.83 0.89 1.00 0.83  CALCIUM 9.3 8.5* 8.7* 8.3* 8.6*  MG  --  2.4  --  2.1 2.2  PHOS  --  2.9  --   --   --    GFR: Estimated Creatinine Clearance: 93.9 mL/min (by C-G formula based on SCr of 0.83 mg/dL). Liver Function Tests: Recent Labs  Lab 01/29/19 0217 02/01/19 0253  AST 19 17  ALT 14 15  ALKPHOS 61 52  BILITOT 1.2 0.7  PROT 7.2 6.3*  ALBUMIN 3.2* 2.6*   No results for input(s): LIPASE, AMYLASE in the last 168 hours. No results for input(s): AMMONIA in the last 168 hours. Coagulation Profile: Recent Labs  Lab 01/28/19 1210  INR 0.9   Cardiac Enzymes: Recent Labs  Lab 01/31/19 0157 01/31/19 0804 01/31/19 1358  TROPONINI 0.03* 0.03* <0.03   BNP (last 3 results) No results for input(s): PROBNP in the last 8760 hours. HbA1C: No results for input(s): HGBA1C in the last 72 hours. CBG: Recent Labs  Lab 01/31/19 2102 02/01/19 0533  GLUCAP 108* 99   Lipid Profile: No results for input(s): CHOL, HDL, LDLCALC, TRIG, CHOLHDL, LDLDIRECT in the last 72 hours. Thyroid Function Tests: No results for input(s): TSH, T4TOTAL, FREET4, T3FREE, THYROIDAB in the last 72 hours. Anemia Panel: No results for input(s): VITAMINB12, FOLATE, FERRITIN, TIBC, IRON, RETICCTPCT in the last 72 hours. Sepsis Labs: No results for input(s): PROCALCITON, LATICACIDVEN in the last 168 hours.  Recent Results (from the past 240 hour(s))  MRSA PCR Screening     Status: None   Collection Time: 01/28/19 11:55 PM  Result Value Ref Range Status   MRSA by PCR NEGATIVE NEGATIVE Final    Comment:        The GeneXpert MRSA Assay (FDA approved for NASAL specimens only), is one component of a comprehensive MRSA colonization surveillance program. It is not intended to diagnose MRSA infection nor to guide or monitor treatment for MRSA infections. Performed at [facility], [address] [place], [place] [id number]          Radiology Studies: Ct Head Wo Contrast  Result Date: 01/31/2019 CLINICAL DATA:  Acute metabolic encephalopathy. EXAM: CT HEAD WITHOUT CONTRAST TECHNIQUE: Contiguous axial images were obtained from the base of the skull through the vertex without intravenous contrast. COMPARISON:  09/26/2008 FINDINGS: Brain: No evidence of acute infarction, hemorrhage, hydrocephalus, extra-axial collection or mass lesion/mass effect. Lacunar infarct in the bilateral thalamus that are discrete and chronic appearing on coronal reformats. Vascular: No hyperdense vessel or unexpected calcification. Skull: Normal. Negative for fracture or focal lesion. Sinuses/Orbits: No acute finding. IMPRESSION: 1. No acute finding. 2. 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 Electronically Signed   By: Marnee SpringJonathon  Watts M.D.   On: 01/31/2019 09:24        Scheduled Meds: . chlorhexidine  15 mL Mouth Rinse BID  . Chlorhexidine Gluconate Cloth  6 each Topical Daily  . enoxaparin (LOVENOX) injection  80 mg Subcutaneous Q12H  . folic acid  1 mg Intravenous Daily  .  mouth rinse  15 mL Mouth Rinse q12n4p  . metoprolol tartrate  25 mg Oral Q6H  . thiamine injection  100 mg Intravenous Daily   Continuous Infusions: . dexmedetomidine (PRECEDEX) IV infusion 0.3 mcg/kg/hr (02/01/19 0517)     LOS: 4 days    Time spent: 35    Delaine Lame, MD [facility]  If 7PM-7AM, please contact night-coverage [email or web address] Password [facility] 02/01/2019, 9:23 AM

## 2019-02-02 LAB — CBC
HCT: 47.6 % (ref 39.0–52.0)
Hemoglobin: 14.8 g/dL (ref 13.0–17.0)
MCH: 32.6 pg (ref 26.0–34.0)
MCHC: 31.1 g/dL (ref 30.0–36.0)
MCV: 104.8 fL — ABNORMAL HIGH (ref 80.0–100.0)
Platelets: 399 10*3/uL (ref 150–400)
RBC: 4.54 MIL/uL (ref 4.22–5.81)
RDW: 12.8 % (ref 11.5–15.5)
WBC: 7.7 10*3/uL (ref 4.0–10.5)
nRBC: 0 % (ref 0.0–0.2)

## 2019-02-02 MED ORDER — APIXABAN 5 MG PO TABS
10.0000 mg | ORAL_TABLET | Freq: Two times a day (BID) | ORAL | Status: DC
  Administered 2019-02-02 – 2019-02-03 (×3): 10 mg via ORAL
  Filled 2019-02-02 (×3): qty 2

## 2019-02-02 MED ORDER — APIXABAN 5 MG PO TABS: 5.0000 mg | ORAL_TABLET | Freq: Two times a day (BID) | ORAL | Status: DC

## 2019-02-02 NOTE — Progress Notes (Signed)
PROGRESS NOTE    Cody Porter  TWK:[id number] DOB: 06/09/1961 DOA: 01/28/2019 PCP: Patient, No Pcp Per  Brief Narrative:  58 year old with past medical history relevant for tobacco abuse, alcohol abuse admitted with acute hypoxic respiratory failure due to submassive PE and bilateral lower extremity DVT with course complicated by runs of atrial fibrillation/SVT.   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 -CIWA protocol -Continue thiamine and folate supplementation -Dexmedetomidine discontinued  #) Acute hypoxic respiratory failure due to submassive PE/bilateral DVT: Essentially stable -Echo 01/29/2019 shows normal EF -Transition from enoxaparin to apixaban -Discussed with pulmonary critical care, no need for catheter directed thrombolysis  #) Paroxysmal atrial fibrillation/SVT: Patient is now borderline bradycardic to dexmedetomidine -Echo essentially normal -Continue apixaban -Continue metoprolol tartrate to 25 mg every 12  #) Tobacco abuse: -Counseling provided -Continue nicotine patch  Fluids: Tolerating p.o. Electrolytes: Monitor and supplement Nutrition: Regular diet  Prophylaxis: Apixaban  Disposition: Physical therapy evaluation and oxygen requirements  Full code    Consultants:   Pulmonary critical care  Procedures:  Echo 01/29/2019:   1. The left ventricle has normal systolic function with an ejection fraction of 60-65%. The cavity size was normal. Left ventricular diastolic Doppler parameters are consistent with impaired relaxation.  2. The mitral valve is grossly normal.  3. The tricuspid valve is grossly normal.  4. The aortic valve is grossly normal.  5. The interatrial septum was not  assessed.    Antimicrobials:   none    Subjective: This morning patient is awake in bed.  He denies any nausea, vomiting, diarrhea, cough, congestion, rhinorrhea, chest pain.  He is eager for discharge.  Objective: Vitals:   02/02/19 0700 02/02/19 0800 02/02/19 0805 02/02/19 0859  BP: (!) 173/91  (!) 178/88 (!) 152/81  Pulse: 81   82  Resp: (!) 21 19    Temp:  (!) 97.5 F (36.4 C)    TempSrc:  Oral    SpO2: 98% 99%    Weight:      Height:        Intake/Output Summary (Last 24 hours) at 02/02/2019 0920 Last data filed at 02/02/2019 0900 Gross per 24 hour  Intake 629.53 ml  Output 800 ml  Net -170.47 ml   Filed Weights   01/28/19 1538  Weight: 81 kg    Examination:  General exam: Appears calm and comfortable  Respiratory system: Clear to auscultation. Respiratory effort normal.  Diminished lung sounds at bases Cardiovascular system: Distant heart sounds, regular rate and rhythm, no murmurs Gastrointestinal system: Abdomen is nondistended, soft and nontender. No organomegaly or masses felt. Normal bowel sounds heard. Central nervous system: Alert and oriented, grossly intact, moving all extremities, Extremities: Trace lower extremity edema, right greater than left Skin: No rashes over visible skin Psychiatry: Judgment and insight are intact.    Data Reviewed: I have personally reviewed following labs and imaging studies  CBC: Recent Labs  Lab 01/29/19 0217 01/30/19 0247 01/31/19 0157 02/01/19 0253 02/02/19 0330  WBC 11.6* 10.6* 10.6* 7.8 7.7  HGB 15.4 15.0 14.5 14.5 14.8  HCT 47.3 47.6 45.6 46.9 47.6  MCV 103.7* 103.7* 103.4* 106.6* 104.8*  PLT 215 261 279 307 399   Basic Metabolic Panel: Recent Labs  Lab  01/28/19 1209 01/29/19 0217 01/30/19 0247 01/31/19 0157 02/01/19 0253  NA 133* 134* 134* 133* 136  K 4.4 4.1 3.9 3.9 3.9  CL 101 105 101 103 107  CO2 20* 15* 18* 20* 20*  GLUCOSE 114* 104* 102* 107* 102*  BUN 17 14 17 15 11   CREATININE  1.10 0.83 0.89 1.00 0.83  CALCIUM 9.3 8.5* 8.7* 8.3* 8.6*  MG  --  2.4  --  2.1 2.2  PHOS  --  2.9  --   --   --    GFR: Estimated Creatinine Clearance: 93.9 mL/min (by C-G formula based on SCr of 0.83 mg/dL). Liver Function Tests: Recent Labs  Lab 01/29/19 0217 02/01/19 0253  AST 19 17  ALT 14 15  ALKPHOS 61 52  BILITOT 1.2 0.7  PROT 7.2 6.3*  ALBUMIN 3.2* 2.6*   No results for input(s): LIPASE, AMYLASE in the last 168 hours. No results for input(s): AMMONIA in the last 168 hours. Coagulation Profile: Recent Labs  Lab 01/28/19 1210  INR 0.9   Cardiac Enzymes: Recent Labs  Lab 01/31/19 0157 01/31/19 0804 01/31/19 1358  TROPONINI 0.03* 0.03* <0.03   BNP (last 3 results) No results for input(s): PROBNP in the last 8760 hours. HbA1C: No results for input(s): HGBA1C in the last 72 hours. CBG: Recent Labs  Lab 01/31/19 2102 02/01/19 0533  GLUCAP 108* 99   Lipid Profile: No results for input(s): CHOL, HDL, LDLCALC, TRIG, CHOLHDL, LDLDIRECT in the last 72 hours. Thyroid Function Tests: No results for input(s): TSH, T4TOTAL, FREET4, T3FREE, THYROIDAB in the last 72 hours. Anemia Panel: No results for input(s): VITAMINB12, FOLATE, FERRITIN, TIBC, IRON, RETICCTPCT in the last 72 hours. Sepsis Labs: No results for input(s): PROCALCITON, LATICACIDVEN in the last 168 hours.  Recent Results (from the past 240 hour(s))  MRSA PCR Screening     Status: None   Collection Time: 01/28/19 11:55 PM  Result Value Ref Range Status   MRSA by PCR NEGATIVE NEGATIVE Final    Comment:        The GeneXpert MRSA Assay (FDA approved for NASAL specimens only), is one component of a comprehensive MRSA colonization surveillance program. It is not intended to diagnose MRSA infection nor to guide or monitor treatment for MRSA infections. Performed at [facility], [address] [place], [place] [address]          Radiology Studies: No results found.       Scheduled Meds: . apixaban  10 mg Oral BID   Followed by  . [START ON 02/09/2019] apixaban  5 mg Oral BID  . chlorhexidine  15 mL Mouth Rinse BID  . Chlorhexidine Gluconate Cloth  6 each Topical Daily  . folic acid  1 mg Intravenous Daily  . mouth rinse  15 mL Mouth Rinse q12n4p  . metoprolol tartrate  25 mg Oral BID  . thiamine injection  100 mg Intravenous Daily   Continuous Infusions:    LOS: 5 days    Time spent: 35    Delaine Lame, MD [facility]  If 7PM-7AM, please contact night-coverage [email or web address] Password [email or web address] 02/02/2019, 9:20 AM

## 2019-02-02 NOTE — Progress Notes (Signed)
ANTICOAGULATION CONSULT NOTE - Follow Up Consult  Pharmacy Consult for Apixaban Indication: atrial fibrillation, pulmonary embolus and DVT  No Known Allergies  Patient Measurements: Height: 5\' 8"  (172.7 cm) Weight: 178 lb 8 oz (81 kg) IBW/kg (Calculated) : 68.4 Heparin Dosing Weight: TBW  Vital Signs: Temp: 97.8 F (36.6 C) (03/22 0326) Temp Source: Oral (03/22 0326) BP: 173/91 (03/22 0700) Pulse Rate: 81 (03/22 0700)  Labs: Recent Labs    01/30/19 2153  01/31/19 0157 01/31/19 0300 01/31/19 0804 01/31/19 1358 02/01/19 0253 02/02/19 0330  HGB  --    < > 14.5  --   --   --  14.5 14.8  HCT  --   --  45.6  --   --   --  46.9 47.6  PLT  --   --  279  --   --   --  307 399  HEPARINUNFRC 0.44  --   --  0.29*  --  0.62  --   --   CREATININE  --   --  1.00  --   --   --  0.83  --   TROPONINI  --   --  0.03*  --  0.03* <0.03  --   --    < > = values in this interval not displayed.    Estimated Creatinine Clearance: 93.9 mL/min (by C-G formula based on SCr of 0.83 mg/dL).   Medications:  Infusions:   Assessment: 28 yoM with no significant PMH presents on 3/17 with hemoptysis, left side chest pain x 5 days.  CTAngio = + acute PE with evidence of right heart strain.  [place] + Bilateral LE acute DVT. ECHO without RV dysfunction.  No prior anticoagulation  Today, 02/02/2019:  CBC:  Hgb and Plt stable, WNL  SCr 0.83, CrCl > 30 ml/min  No bleeding or complications noted  Goal of Therapy:  Monitor platelets by anticoagulation protocol: Yes   Plan:   Lovenox d/c.  Start Apixaban 10 mg PO BID x7 days, then 5 mg PO BID  Continue to monitor H&H and platelets   Lynann Beaver PharmD, BCPS Pager [phone] 02/02/2019 8:12 AM

## 2019-02-02 NOTE — Evaluation (Signed)
Occupational Therapy Evaluation Patient Details Name: Cody Porter MRN: [id number] DOB: 1961-07-07 Today's Date: 02/02/2019    History of Present Illness 58 year old with past medical history relevant for tobacco abuse, alcohol abuse admitted with acute hypoxic respiratory failure due to submassive PE and bilateral lower extremity DVT with course complicated by runs of atrial fibrillation/SVT., restlessness, requiring Precedex.   Clinical Impression   Pt admitted with the above. Pt currently with functional limitations due to the deficits listed below (see OT Problem List).  Pt will benefit from skilled OT to increase their safety and independence with ADL and functional mobility for ADL to facilitate discharge to venue listed below.      Follow Up Recommendations  No OT follow up;Supervision - Intermittent    Equipment Recommendations  None recommended by OT    Recommendations for Other Services       Precautions / Restrictions Precautions Precautions: Fall Precaution Comments: monitor sats Restrictions Weight Bearing Restrictions: No      Mobility Bed Mobility Overal bed mobility: Needs Assistance Bed Mobility: Supine to Sit;Sit to Supine     Supine to sit: Supervision Sit to supine: Supervision   General bed mobility comments: pt OOB  Transfers Overall transfer level: Needs assistance Equipment used: Rolling walker (2 wheeled) Transfers: Sit to/from [employer] Sit to Stand: Supervision Stand pivot transfers: Supervision       General transfer comment: for safety    Balance Overall balance assessment: Mild deficits observed, not formally tested                                         ADL either performed or assessed with clinical judgement   ADL Overall ADL's : Needs assistance/impaired Eating/Feeding: Set up;Sitting   Grooming: Set up;Sitting   Upper Body Bathing: Set up;Sitting   Lower Body Bathing: Minimal  assistance;Sit to/from stand   Upper Body Dressing : Set up;Sitting   Lower Body Dressing: Minimal assistance;Sit to/from stand;Cueing for sequencing;Cueing for safety   Toilet Transfer: Min guard   Toileting- Clothing Manipulation and Hygiene: Min guard;Sit to/from stand       Functional mobility during ADLs: Min guard General ADL Comments: pt fatigued quickly . Pt cares for his older mother.       Vision Patient Visual Report: No change from baseline              Pertinent Vitals/Pain Pain Assessment: Faces Pain Score: 2  Faces Pain Scale: Hurts little more Pain Location: left side sternal area Pain Descriptors / Indicators: Discomfort Pain Intervention(s): Monitored during session     Hand Dominance     Extremity/Trunk Assessment         Cervical / Trunk Assessment Cervical / Trunk Assessment: Normal   Communication Communication Communication: No difficulties   Cognition Arousal/Alertness: Awake/alert Behavior During Therapy: WFL for tasks assessed/performed Overall Cognitive Status: Within Functional Limits for tasks assessed                                                Home Living Family/patient expects to be discharged to:: Private residence Living Arrangements: Parent Available Help at Discharge: Family Type of Home: House       Home Layout: Two level;Bed/bath upstairs Alternate Level Stairs-Number of  Steps: reports mother sleeps on first floor usuually, Alternate Level Stairs-Rails: Right;Left           Home Equipment: None          Prior Functioning/Environment Level of Independence: Independent        Comments: walks 2 miles a day        OT Problem List: Decreased strength;Decreased activity tolerance      OT Treatment/Interventions: Self-care/ADL training;Therapeutic activities;Energy conservation    OT Goals(Current goals can be found in the care plan section) Acute Rehab OT Goals Patient Stated  Goal: to go home OT Goal Formulation: With patient Time For Goal Achievement: 02/09/19 Potential to Achieve Goals: Good  OT Frequency: Min 2X/week   Barriers to D/C: Decreased caregiver support  pt cares for his older mther          AM-PAC OT "6 Clicks" Daily Activity     Outcome Measure Help from another person eating meals?: None Help from another person taking care of personal grooming?: None Help from another person toileting, which includes using toliet, bedpan, or urinal?: A Little Help from another person bathing (including washing, rinsing, drying)?: A Little Help from another person to put on and taking off regular upper body clothing?: None Help from another person to put on and taking off regular lower body clothing?: A Little 6 Click Score: 21   End of Session Equipment Utilized During Treatment: Rolling walker Nurse Communication: Mobility status  Activity Tolerance: Patient tolerated treatment well Patient left: in chair;with call bell/phone within reach;with chair alarm set  OT Visit Diagnosis: Unsteadiness on feet (R26.81);Muscle weakness (generalized) (M62.81)                Time: 0254-2706 OT Time Calculation (min): 14 min Charges:  OT General Charges $OT Visit: 1 Visit OT Evaluation $OT Eval Moderate Complexity: 1 Mod  Lise Auer, OT Acute Rehabilitation Services [phone] Office- [phone]     Tyianna Menefee, Karin Golden D 02/02/2019, 2:24 PM

## 2019-02-02 NOTE — Progress Notes (Signed)
Physical Therapy Treatment Patient Details Name: NICHAEL REEM MRN: [id number] DOB: Nov 15, 1960 Today's Date: 02/02/2019    History of Present Illness 58 year old with past medical history relevant for tobacco abuse, alcohol abuse admitted with acute hypoxic respiratory failure due to submassive PE and bilateral lower extremity DVT with course complicated by runs of atrial fibrillation/SVT., restlessness, requiring Precedex.    PT Comments    Progressing well with mobility.    Follow Up Recommendations  Supervision - Intermittent     Equipment Recommendations  None recommended by PT    Recommendations for Other Services       Precautions / Restrictions Precautions Precautions: Fall Precaution Comments: monitor sats Restrictions Weight Bearing Restrictions: No    Mobility  Bed Mobility Overal bed mobility: Needs Assistance Bed Mobility: Supine to Sit;Sit to Supine     Supine to sit: Supervision Sit to supine: Supervision   General bed mobility comments: for safety  Transfers Overall transfer level: Needs assistance   Transfers: Sit to/from Stand Sit to Stand: Supervision         General transfer comment: for safety  Ambulation/Gait Ambulation/Gait assistance: Supervision Gait Distance (Feet): 200 Feet Assistive device: None Gait Pattern/deviations: Step-through pattern     General Gait Details: for safety. slightly unsteady but no LOB   Stairs             Wheelchair Mobility    Modified Rankin (Stroke Patients Only)       Balance Overall balance assessment: Mild deficits observed, not formally tested                                          Cognition Arousal/Alertness: Awake/alert Behavior During Therapy: WFL for tasks assessed/performed Overall Cognitive Status: Within Functional Limits for tasks assessed                                        Exercises      General Comments         Pertinent Vitals/Pain Pain Assessment: Faces Faces Pain Scale: Hurts little more Pain Location: left side sternal with deep breath Pain Descriptors / Indicators: Discomfort Pain Intervention(s): Monitored during session    Home Living                      Prior Function            PT Goals (current goals can now be found in the care plan section) Progress towards PT goals: Progressing toward goals    Frequency    Min 3X/week      PT Plan Current plan remains appropriate    Co-evaluation              AM-PAC PT "6 Clicks" Mobility   Outcome Measure  Help needed turning from your back to your side while in a flat bed without using bedrails?: A Little Help needed moving from lying on your back to sitting on the side of a flat bed without using bedrails?: A Little Help needed moving to and from a bed to a chair (including a wheelchair)?: A Little Help needed standing up from a chair using your arms (e.g., wheelchair or bedside chair)?: A Little Help needed to walk in hospital room?: A Little Help needed climbing 3-5 steps  with a railing? : A Little 6 Click Score: 18    End of Session   Activity Tolerance: Patient tolerated treatment well Patient left: in bed;with call bell/phone within reach;with bed alarm set   PT Visit Diagnosis: Unsteadiness on feet (R26.81)     Time: 1941-7408 PT Time Calculation (min) (ACUTE ONLY): 11 min  Charges:  $Gait Training: 8-22 mins                       Rebeca Alert, PT Acute Rehabilitation Services Pager: [phone] Office: [phone]

## 2019-02-03 LAB — CBC
HCT: 44.2 % (ref 39.0–52.0)
Hemoglobin: 14.2 g/dL (ref 13.0–17.0)
MCH: 33.1 pg (ref 26.0–34.0)
MCHC: 32.1 g/dL (ref 30.0–36.0)
MCV: 103 fL — ABNORMAL HIGH (ref 80.0–100.0)
Platelets: 401 10*3/uL — ABNORMAL HIGH (ref 150–400)
RBC: 4.29 MIL/uL (ref 4.22–5.81)
RDW: 12.9 % (ref 11.5–15.5)
WBC: 7.5 10*3/uL (ref 4.0–10.5)
nRBC: 0 % (ref 0.0–0.2)

## 2019-02-03 MED ORDER — VITAMIN B-1 100 MG PO TABS
100.0000 mg | ORAL_TABLET | Freq: Every day | ORAL | Status: DC
  Administered 2019-02-03: 100 mg via ORAL
  Filled 2019-02-03: qty 1

## 2019-02-03 MED ORDER — APIXABAN 5 MG PO TABS: 5.0000 mg | ORAL_TABLET | Freq: Two times a day (BID) | ORAL | 0 refills | Status: DC

## 2019-02-03 MED ORDER — APIXABAN 5 MG PO TABS: 10.0000 mg | ORAL_TABLET | Freq: Two times a day (BID) | ORAL | 0 refills | Status: DC

## 2019-02-03 MED ORDER — METOPROLOL TARTRATE 25 MG PO TABS: 25.0000 mg | ORAL_TABLET | Freq: Two times a day (BID) | ORAL | 1 refills | Status: DC

## 2019-02-03 MED ORDER — HYDRALAZINE HCL 20 MG/ML IJ SOLN
10.0000 mg | Freq: Four times a day (QID) | INTRAMUSCULAR | Status: DC | PRN
  Administered 2019-02-03: 10 mg via INTRAVENOUS
  Filled 2019-02-03: qty 1

## 2019-02-03 MED ORDER — FOLIC ACID 1 MG PO TABS
1.0000 mg | ORAL_TABLET | Freq: Every day | ORAL | Status: DC
  Administered 2019-02-03: 1 mg via ORAL
  Filled 2019-02-03: qty 1

## 2019-02-03 MED FILL — ELIQUIS 5 MG TABLET: 5 | 30 days supply | Qty: 60 | Fill #0

## 2019-02-03 NOTE — Progress Notes (Signed)
Pt alert and oriented x3, VSS, resp even and unlabored, skin pink/warm/dry, in NAD. Pt had uneventful shift, tolerated PO intake well with more than adequate output, pain controlled with PRN meds, pt slept well. Pt's BP elevated with no PRN meds. On call Resident paged and order given for Hydralazine PRN. See Orders and MAR for further details.

## 2019-02-03 NOTE — Progress Notes (Signed)
Pt discharged from the unit via wheelchair. Discharge instructions reviewed with patient. eliquis card and information on how to obtain prescriptions were given to the pt. No questions or concerns at this time.

## 2019-02-03 NOTE — TOC Transition Note (Signed)
Transition of Care [facility]) - CM/SW Discharge Note   Patient Details  Name: Cody Porter MRN: [id number] Date of Birth: 1960/12/30  [facility] CM/SW Contact:  Lanier Clam, RN Phone Number: 02/03/2019, 10:54 AM   Clinical Narrative:   Provided MATCH program for patient-informed of requirements-1xuse/12 calendar months,$3 co pay(patient can afford)select pharmacies/must use within 7days.     Final next level of care: Home/Self Care Barriers to Discharge: No Barriers Identified   Patient Goals and CMS Choice Patient states their goals for this hospitalization and ongoing recovery are:: (go home)      Discharge Placement                       Discharge Plan and Services-d/c home w/MATCH program                          Social Determinants of Health (SDOH) Interventions     Readmission Risk Interventions No flowsheet data found.

## 2019-02-03 NOTE — Discharge Summary (Signed)
Physician Discharge Summary  Cody Porter ZOX:[id number] DOB: 1961/10/15 DOA: 01/28/2019  PCP: Patient, No Pcp Per  Admit date: 01/28/2019 Discharge date: 02/03/2019  Admitted From: Home Disposition: Home  Recommendations for Outpatient Follow-up:  1. Follow up with PCP in 1-2 weeks 2. Please obtain BMP/CBC in one week   Home Health: None Equipment/Devices: None  Discharge Condition: Stable CODE STATUS: Full Diet recommendation: Heart Healthy  Brief/Interim Summary:  #) Acute hypoxic respiratory failure due to PE/bilateral DVT: Patient was admitted with acute hypoxia and found to have PE and bilateral DVTs.  Echo showed a normal EF.  BNP was minimally elevated.  Troponins were negative.  Patient was initially on IV heparin and then transition to subcu Lovenox and then oral apixaban.  Patient was weaned to room air.  #) Paroxysmal atrial fibrillation/SVT: During his course patient had runs of SVT as well as paroxysmal atrial fibrillation.  Echo was normal.  Patient was started on metoprolol tartrate 25 mg every 12 hours as well as apixaban.  Pain is a converted to sinus.  #) Acute metabolic encephalopathy due to alcohol withdrawal: Patient was admitted with a long history of alcohol withdrawal.  He was placed on CIWA protocol as well as thiamine and folate.  He required approximately 24 hours of dexmedetomidine for agitation.  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  This resolved after his withdrawals resolved.  Patient was counseled discontinues drinking.  #) Tobacco abuse: Patient was given a nicotine patch and counseling was provided.  Discharge Diagnoses:  Active Problems:   ETOH abuse   Pulmonary embolus (HCC)   Leg DVT (deep venous thromboembolism), acute, bilateral (HCC)   Acute pulmonary embolism [facility])    Discharge Instructions  Discharge Instructions    Call MD for:  difficulty breathing, headache or visual disturbances   Complete by:  As  directed    Call MD for:  hives   Complete by:  As directed    Call MD for:  persistant nausea and vomiting   Complete by:  As directed    Call MD for:  redness, tenderness, or signs of infection (pain, swelling, redness, odor or green/yellow discharge around incision site)   Complete by:  As directed    Call MD for:  severe uncontrolled pain   Complete by:  As directed    Call MD for:  temperature >100.4   Complete by:  As directed    Diet - low sodium heart healthy   Complete by:  As directed    Discharge instructions   Complete by:  As directed    Please follow-up with your primary care doctor in 1 week.  Please take your blood thinners as prescribed.   Increase activity slowly   Complete by:  As directed      Allergies as of 02/03/2019   No Known Allergies     Medication List    TAKE these medications   apixaban 5 MG Tabs tablet Commonly known as:  ELIQUIS Take 2 tablets (10 mg total) by mouth 2 (two) times daily for 13 days.   apixaban 5 MG Tabs tablet Commonly known as:  ELIQUIS Take 1 tablet (5 mg total) by mouth 2 (two) times daily for 30 days. Start taking on:  February 17, 2019   metoprolol tartrate 25 MG tablet Commonly known as:  LOPRESSOR Take 1 tablet (25 mg total) by mouth 2 (two) times daily for 30 days.       No  Known Allergies  Consultations:  Pulmonary   Procedures/Studies: Dg Chest 2 View  Result Date: 01/28/2019 CLINICAL DATA:  LEFT-sided chest pain for 5 days. EXAM: CHEST - 2 VIEW COMPARISON:  09/19/2008. FINDINGS: Suspected lingular infiltrate/atelectasis, with volume loss in the LEFT hemithorax. No effusion. No pneumothorax. RIGHT lung clear. Heart size normal. No osseous findings. IMPRESSION: Suspected lingular infiltrate/atelectasis, with volume loss in the LEFT hemithorax. Electronically Signed   By: Elsie StainJohn T Curnes M.D.   On: 01/28/2019 12:33   Ct Head Wo Contrast  Result Date: 01/31/2019 CLINICAL DATA:  Acute metabolic encephalopathy.  EXAM: CT HEAD WITHOUT CONTRAST TECHNIQUE: Contiguous axial images were obtained from the base of the skull through the vertex without intravenous contrast. COMPARISON:  09/26/2008 FINDINGS: Brain: No evidence of acute infarction, hemorrhage, hydrocephalus, extra-axial collection or mass lesion/mass effect. Lacunar infarct in the bilateral thalamus that are discrete and chronic appearing on coronal reformats. Vascular: No hyperdense vessel or unexpected calcification. Skull: Normal. Negative for fracture or focal lesion. Sinuses/Orbits: No acute finding. IMPRESSION: 1. No acute finding. 2. 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 Electronically Signed   By: Marnee SpringJonathon  Watts M.D.   On: 01/31/2019 09:24   Ct Angio Chest Pe W/cm &/or Wo Cm  Result Date: 01/28/2019 CLINICAL DATA:  Chest pain EXAM: CT ANGIOGRAPHY CHEST WITH CONTRAST TECHNIQUE: Multidetector CT imaging of the chest was performed using the standard protocol during bolus administration of intravenous contrast. Multiplanar CT image reconstructions and MIPs were obtained to evaluate the vascular anatomy. CONTRAST:  74mL [id number] COMPARISON:  Chest film from earlier in the same FINDINGS: Cardiovascular: Atherosclerotic calcifications of the thoracic aorta are noted without aneurysmal dilatation or dissection. No cardiac enlargement is noted. There are changes suggestive of mild right heart strain within RV/LV ratio of 1. The pulmonary artery demonstrates bilateral pulmonary emboli throughout the entire arterial system. Mediastinum/Nodes: The esophagus shows evidence of a small sliding-type hiatal hernia. The thoracic inlet is within normal limits. No sizable hilar or mediastinal adenopathy is identified. Lungs/Pleura: Mild infiltrate is noted within the left lingula. This corresponds to the area of abnormality on recent chest x-ray and likely in part related to the known pulmonary embolism. No sizable effusion is seen. Mild emphysematous changes  are noted. No sizable parenchymal nodules are seen. Upper Abdomen: Visualized upper abdomen is within normal limits. Musculoskeletal: Degenerative changes of the thoracic spine are seen. No compression deformities are noted. Review of the MIP images confirms the above findings. IMPRESSION: Positive for acute PE with CT evidence of right heart strain (RV/LV Ratio = 1) consistent with at least submassive (intermediate risk) PE. The presence of right heart strain has been associated with an increased risk of morbidity and mortality. Please activate Code PE by paging [phone]. Mild lingular infiltrate which is likely related to the underlying pulmonary embolism. Aortic Atherosclerosis (ICD10-I70.0) and Emphysema (ICD10-J43.9). Critical Value/emergent results were called by telephone at the time of interpretation on 01/28/2019 at 3:10 pm to [facility] LLCKELLY GEKAS, PA , who verbally acknowledged these results. Electronically Signed   By: Alcide CleverMark  Lukens M.D.   On: 01/28/2019 15:13   Vas [place] Lower Extremity Venous (dvt) (only [facility])  Result Date: 01/29/2019  Lower Venous Study Indications: Edema, SOB, and pulmonary embolism.  Risk Factors: Confirmed PE. Performing Technologist: Toma DeitersVirginia Slaughter RVS  Examination Guidelines: A complete evaluation includes B-mode imaging, spectral Doppler, color Doppler, and power Doppler as needed of all accessible portions of each vessel. Bilateral testing is considered an integral part of a  complete examination. Limited examinations for reoccurring indications may be performed as noted.  Right Venous Findings: +---------+---------------+---------+-----------+----------+--------------+          CompressibilityPhasicitySpontaneityPropertiesSummary        +---------+---------------+---------+-----------+----------+--------------+ CFV      Full           Yes                                          +---------+---------------+---------+-----------+----------+--------------+ SFJ       Full                                                        +---------+---------------+---------+-----------+----------+--------------+ FV Prox  Full           Yes      Yes                                 +---------+---------------+---------+-----------+----------+--------------+ FV Mid   None           No       No                                  +---------+---------------+---------+-----------+----------+--------------+ FV DistalNone           No       No                                  +---------+---------------+---------+-----------+----------+--------------+ PFV      Full           Yes      Yes                                 +---------+---------------+---------+-----------+----------+--------------+ POP      None                                                        +---------+---------------+---------+-----------+----------+--------------+ PTV      Partial                                      distal region  +---------+---------------+---------+-----------+----------+--------------+ PERO                                                  Not visualized +---------+---------------+---------+-----------+----------+--------------+ SSV      Partial                                                     +---------+---------------+---------+-----------+----------+--------------+  Unable to image the iliac veins  Left Venous Findings: +---------+---------------+---------+-----------+----------+-------------------+          CompressibilityPhasicitySpontaneityPropertiesSummary             +---------+---------------+---------+-----------+----------+-------------------+ CFV      Partial        Yes      Yes                  mobile in the mid                                                         region at the sfj   +---------+---------------+---------+-----------+----------+-------------------+ SFJ      Partial                                       mobile              +---------+---------------+---------+-----------+----------+-------------------+ FV Prox  None                                                             +---------+---------------+---------+-----------+----------+-------------------+ FV Mid   None                                                             +---------+---------------+---------+-----------+----------+-------------------+ FV DistalNone                                                             +---------+---------------+---------+-----------+----------+-------------------+ PFV      Full           Yes      Yes                                      +---------+---------------+---------+-----------+----------+-------------------+ POP      Partial                                      minimal flow        +---------+---------------+---------+-----------+----------+-------------------+ PTV      Full                                                             +---------+---------------+---------+-----------+----------+-------------------+ PERO  not visualized due                                                        to edema            +---------+---------------+---------+-----------+----------+-------------------+ Unable to visualize the iliac veins    Summary: Right: Findings consistent with acute deep vein thrombosis involving the right femoral vein, right popliteal vein, right posterior tibial vein, and right gastrocnemius vein. See summaru comnets listed above Left: Findings consistent with acute deep vein thrombosis involving the left common femoral vein, left femoral vein, and left popliteal vein. See summary comments listed above  *See table(s) above for measurements and observations. Electronically signed by Gretta Began MD on 01/29/2019 at 5:02:17 PM.    Final   Echo 01/29/2019:  1. The left ventricle has normal  systolic function with an ejection fraction of 60-65%. The cavity size was normal. Left ventricular diastolic Doppler parameters are consistent with impaired relaxation. 2. The mitral valve is grossly normal. 3. The tricuspid valve is grossly normal. 4. The aortic valve is grossly normal. 5. The interatrial septum was not assessed.   Subjective:   Discharge Exam: Vitals:   02/03/19 0610 02/03/19 0929  BP: (!) 177/98 (!) 161/99  Pulse: 86 85  Resp:    Temp:    SpO2:     Vitals:   02/02/19 2330 02/03/19 0549 02/03/19 0610 02/03/19 0929  BP: (!) 142/84 (!) 185/99 (!) 177/98 (!) 161/99  Pulse: 82 81 86 85  Resp: 16 14    Temp:  97.8 F (36.6 C)    TempSrc:  Oral    SpO2: 94% 94%    Weight:      Height:       General exam: Appears calm and comfortable  Respiratory system: Clear to auscultation. Respiratory effort normal.  Diminished lung sounds at bases Cardiovascular system: Distant heart sounds, regular rate and rhythm, no murmurs Gastrointestinal system: Abdomen is nondistended, soft and nontender. No organomegaly or masses felt. Normal bowel sounds heard. Central nervous system: Alert and oriented, grossly intact, moving all extremities, Extremities: Trace lower extremity edema, right greater than left Skin: No rashes over visible skin Psychiatry: Judgment and insight are intact.    The results of significant diagnostics from this hospitalization (including imaging, microbiology, ancillary and laboratory) are listed below for reference.     Microbiology: Recent Results (from the past 240 hour(s))  MRSA PCR Screening     Status: None   Collection Time: 01/28/19 11:55 PM  Result Value Ref Range Status   MRSA by PCR NEGATIVE NEGATIVE Final    Comment:        The GeneXpert MRSA Assay (FDA approved for NASAL specimens only), is one component of a comprehensive MRSA colonization surveillance program. It is not intended to diagnose MRSA infection nor to guide  or monitor treatment for MRSA infections. Performed at [facility], [address] [place], [place] [address]      Labs: BNP (last 3 results) Recent Labs    01/28/19 1210  BNP 214.4*   Basic Metabolic Panel: Recent Labs  Lab 01/28/19 1209 01/29/19 0217 01/30/19 0247 01/31/19 0157 02/01/19 0253  NA 133* 134* 134* 133* 136  K 4.4 4.1 3.9 3.9 3.9  CL 101 105 101 103 107  CO2 20*  15* 18* 20* 20*  GLUCOSE 114* 104* 102* 107* 102*  BUN 17 14 17 15 11   CREATININE 1.10 0.83 0.89 1.00 0.83  CALCIUM 9.3 8.5* 8.7* 8.3* 8.6*  MG  --  2.4  --  2.1 2.2  PHOS  --  2.9  --   --   --    Liver Function Tests: Recent Labs  Lab 01/29/19 0217 02/01/19 0253  AST 19 17  ALT 14 15  ALKPHOS 61 52  BILITOT 1.2 0.7  PROT 7.2 6.3*  ALBUMIN 3.2* 2.6*   No results for input(s): LIPASE, AMYLASE in the last 168 hours. No results for input(s): AMMONIA in the last 168 hours. CBC: Recent Labs  Lab 01/30/19 0247 01/31/19 0157 02/01/19 0253 02/02/19 0330 02/03/19 0514  WBC 10.6* 10.6* 7.8 7.7 7.5  HGB 15.0 14.5 14.5 14.8 14.2  HCT 47.6 45.6 46.9 47.6 44.2  MCV 103.7* 103.4* 106.6* 104.8* 103.0*  PLT 261 279 307 399 401*   Cardiac Enzymes: Recent Labs  Lab 01/31/19 0157 01/31/19 0804 01/31/19 1358  TROPONINI 0.03* 0.03* <0.03   BNP: Invalid input(s): POCBNP CBG: Recent Labs  Lab 01/31/19 2102 02/01/19 0533  GLUCAP 108* 99   D-Dimer No results for input(s): DDIMER in the last 72 hours. Hgb A1c No results for input(s): HGBA1C in the last 72 hours. Lipid Profile No results for input(s): CHOL, HDL, LDLCALC, TRIG, CHOLHDL, LDLDIRECT in the last 72 hours. Thyroid function studies No results for input(s): TSH, T4TOTAL, T3FREE, THYROIDAB in the last 72 hours.  Invalid input(s): FREET3 Anemia work up No results for input(s): VITAMINB12, FOLATE, FERRITIN, TIBC, IRON, RETICCTPCT in the last 72 hours. Urinalysis    Component Value Date/Time   COLORURINE  YELLOW 06/30/2011 2202   APPEARANCEUR TURBID (A) 06/30/2011 2202   LABSPEC 1.035 (H) 06/30/2011 2202   PHURINE 5.0 06/30/2011 2202   GLUCOSEU NEGATIVE 06/30/2011 2202   HGBUR SMALL (A) 06/30/2011 2202   BILIRUBINUR SMALL (A) 06/30/2011 2202   KETONESUR 15 (A) 06/30/2011 2202   PROTEINUR 30 (A) 06/30/2011 2202   UROBILINOGEN 1.0 06/30/2011 2202   NITRITE NEGATIVE 06/30/2011 2202   LEUKOCYTESUR NEGATIVE 06/30/2011 2202   Sepsis Labs Invalid input(s): PROCALCITONIN,  WBC,  LACTICIDVEN Microbiology Recent Results (from the past 240 hour(s))  MRSA PCR Screening     Status: None   Collection Time: 01/28/19 11:55 PM  Result Value Ref Range Status   MRSA by PCR NEGATIVE NEGATIVE Final    Comment:        The GeneXpert MRSA Assay (FDA approved for NASAL specimens only), is one component of a comprehensive MRSA colonization surveillance program. It is not intended to diagnose MRSA infection nor to guide or monitor treatment for MRSA infections. Performed at [facility], [address], [place], [place] [address]      Time coordinating discharge:35  SIGNED:   Delaine Lame, MD  [facility] 02/03/2019, 9:35 AM  If 7PM-7AM, please contact night-coverage [email or web address] Password [email or web address]

## 2019-02-03 NOTE — Progress Notes (Signed)
SATURATION QUALIFICATIONS: (This note is used to comply with regulatory documentation for home oxygen)  Patient Saturations on Room Air at Rest = 98%  Patient Saturations on Room Air while Ambulating = 100%  Patient Saturations on  Liters of oxygen while Ambulating = %  Please briefly explain why patient needs home oxygen:

## 2019-02-03 NOTE — TOC Transition Note (Signed)
Transition of Care [facility]) - CM/SW Discharge Note   Patient Details  Name: CORRAN PERSELL MRN: [id number] Date of Birth: October 14, 1961  Transition of Care [facility]) CM/SW Contact:  Lanier Clam, RN Phone Number: 02/03/2019, 10:33 AM   Clinical Narrative:Patient has no insurance-provided w/[facility] clinics for pcp appt;health insurance info, patient has $4 for metoprolol, pharmacy to provide eliquis 30 day free coupon. [facility] otpt pharmacy for meds they will have meds delivered to patient's rm. Patient has own transp home. No further CM needs.       Final next level of care: Home/Self Care Barriers to Discharge: No Barriers Identified   Patient Goals and CMS Choice Patient states their goals for this hospitalization and ongoing recovery are:: (go home)      Discharge Placement                       Discharge Plan and Services                          Social Determinants of Health (SDOH) Interventions     Readmission Risk Interventions No flowsheet data found.

## 2019-02-03 NOTE — Discharge Instructions (Signed)
Information on my medicine - ELIQUIS (apixaban)  This medication education was reviewed with me or my healthcare representative as part of my discharge preparation.  Why was Eliquis prescribed for you? Eliquis was prescribed to treat blood clots that may have been found in the veins of your legs (deep vein thrombosis) or in your lungs (pulmonary embolism) and to reduce the risk of them occurring again.  What do You need to know about Eliquis ? The starting dose is 10 mg (two 5 mg tablets) taken TWICE daily for the FIRST SEVEN (7) DAYS, then on Sunday March 29 the dose is reduced to ONE 5 mg tablet taken TWICE daily.  Eliquis may be taken with or without food.   Try to take the dose about the same time in the morning and in the evening. If you have difficulty swallowing the tablet whole please discuss with your pharmacist how to take the medication safely.  Take Eliquis exactly as prescribed and DO NOT stop taking Eliquis without talking to the doctor who prescribed the medication.  Stopping may increase your risk of developing a new blood clot.  Refill your prescription before you run out.  After discharge, you should have regular check-up appointments with your healthcare provider that is prescribing your Eliquis.    What do you do if you miss a dose? If a dose of ELIQUIS is not taken at the scheduled time, take it as soon as possible on the same day and twice-daily administration should be resumed. The dose should not be doubled to make up for a missed dose.  Important Safety Information A possible side effect of Eliquis is bleeding. You should call your healthcare provider right away if you experience any of the following: ? Bleeding from an injury or your nose that does not stop. ? Unusual colored urine (red or dark brown) or unusual colored stools (red or black). ? Unusual bruising for unknown reasons. ? A serious fall or if you hit your head (even if there is no  bleeding).  Some medicines may interact with Eliquis and might increase your risk of bleeding or clotting while on Eliquis. To help avoid this, consult your healthcare provider or pharmacist prior to using any new prescription or non-prescription medications, including herbals, vitamins, non-steroidal anti-inflammatory drugs (NSAIDs) and supplements.  This website has more information on Eliquis (apixaban): http://[email or web address].com/eliquis/home

## 2019-02-04 MED FILL — METOPROLOL TARTRATE 25 MG T: 25 | 30 days supply | Qty: 60 | Fill #0

## 2019-02-23 ENCOUNTER — Inpatient Hospital Stay (HOSPITAL_COMMUNITY): Payer: Self-pay

## 2019-02-23 ENCOUNTER — Emergency Department (HOSPITAL_COMMUNITY): Payer: Self-pay

## 2019-02-23 ENCOUNTER — Encounter (HOSPITAL_COMMUNITY): Payer: Self-pay

## 2019-02-23 ENCOUNTER — Inpatient Hospital Stay (HOSPITAL_COMMUNITY)
Admission: EM | Admit: 2019-02-23 | Discharge: 2019-03-01 | DRG: 040 | Disposition: A | Discharge status: Rehabilitation Facility | Payer: Self-pay | Attending: Neurology | Admitting: Neurology

## 2019-02-23 ENCOUNTER — Other Ambulatory Visit: Payer: Self-pay

## 2019-02-23 DIAGNOSIS — Z7901 Long term (current) use of anticoagulants: Secondary | ICD-10-CM

## 2019-02-23 DIAGNOSIS — I619 Nontraumatic intracerebral hemorrhage, unspecified: Secondary | ICD-10-CM | POA: Diagnosis present

## 2019-02-23 DIAGNOSIS — Z823 Family history of stroke: Secondary | ICD-10-CM

## 2019-02-23 DIAGNOSIS — G935 Compression of brain: Secondary | ICD-10-CM | POA: Diagnosis present

## 2019-02-23 DIAGNOSIS — I10 Essential (primary) hypertension: Secondary | ICD-10-CM | POA: Diagnosis present

## 2019-02-23 DIAGNOSIS — I480 Paroxysmal atrial fibrillation: Secondary | ICD-10-CM | POA: Diagnosis present

## 2019-02-23 DIAGNOSIS — R739 Hyperglycemia, unspecified: Secondary | ICD-10-CM | POA: Diagnosis present

## 2019-02-23 DIAGNOSIS — R29700 NIHSS score 0: Secondary | ICD-10-CM | POA: Diagnosis present

## 2019-02-23 DIAGNOSIS — G936 Cerebral edema: Secondary | ICD-10-CM | POA: Diagnosis present

## 2019-02-23 DIAGNOSIS — E876 Hypokalemia: Secondary | ICD-10-CM | POA: Diagnosis present

## 2019-02-23 DIAGNOSIS — Z86711 Personal history of pulmonary embolism: Secondary | ICD-10-CM

## 2019-02-23 DIAGNOSIS — I82403 Acute embolism and thrombosis of unspecified deep veins of lower extremity, bilateral: Secondary | ICD-10-CM | POA: Diagnosis present

## 2019-02-23 DIAGNOSIS — Z9181 History of falling: Secondary | ICD-10-CM

## 2019-02-23 DIAGNOSIS — R1310 Dysphagia, unspecified: Secondary | ICD-10-CM | POA: Diagnosis present

## 2019-02-23 DIAGNOSIS — Z79899 Other long term (current) drug therapy: Secondary | ICD-10-CM

## 2019-02-23 DIAGNOSIS — I4891 Unspecified atrial fibrillation: Secondary | ICD-10-CM | POA: Diagnosis present

## 2019-02-23 DIAGNOSIS — I614 Nontraumatic intracerebral hemorrhage in cerebellum: Principal | ICD-10-CM | POA: Diagnosis present

## 2019-02-23 DIAGNOSIS — K921 Melena: Secondary | ICD-10-CM | POA: Diagnosis present

## 2019-02-23 DIAGNOSIS — I161 Hypertensive emergency: Secondary | ICD-10-CM | POA: Diagnosis present

## 2019-02-23 DIAGNOSIS — F10239 Alcohol dependence with withdrawal, unspecified: Secondary | ICD-10-CM | POA: Diagnosis present

## 2019-02-23 DIAGNOSIS — Z86718 Personal history of other venous thrombosis and embolism: Secondary | ICD-10-CM

## 2019-02-23 DIAGNOSIS — I2699 Other pulmonary embolism without acute cor pulmonale: Secondary | ICD-10-CM | POA: Diagnosis present

## 2019-02-23 DIAGNOSIS — F17210 Nicotine dependence, cigarettes, uncomplicated: Secondary | ICD-10-CM | POA: Diagnosis present

## 2019-02-23 DIAGNOSIS — E870 Hyperosmolality and hypernatremia: Secondary | ICD-10-CM | POA: Diagnosis present

## 2019-02-23 DIAGNOSIS — R791 Abnormal coagulation profile: Secondary | ICD-10-CM | POA: Diagnosis present

## 2019-02-23 DIAGNOSIS — F1010 Alcohol abuse, uncomplicated: Secondary | ICD-10-CM | POA: Diagnosis present

## 2019-02-23 LAB — URINALYSIS, ROUTINE W REFLEX MICROSCOPIC
Bilirubin Urine: NEGATIVE
Glucose, UA: NEGATIVE mg/dL
Hgb urine dipstick: NEGATIVE
Ketones, ur: 5 mg/dL — AB
Leukocytes,Ua: NEGATIVE
Nitrite: NEGATIVE
Protein, ur: NEGATIVE mg/dL
Specific Gravity, Urine: 1.014 (ref 1.005–1.030)
pH: 8 (ref 5.0–8.0)

## 2019-02-23 LAB — PROTIME-INR
INR: 1 (ref 0.8–1.2)
Prothrombin Time: 13.3 seconds (ref 11.4–15.2)

## 2019-02-23 LAB — CBC WITH DIFFERENTIAL/PLATELET
Abs Immature Granulocytes: 0.03 10*3/uL (ref 0.00–0.07)
Basophils Absolute: 0 10*3/uL (ref 0.0–0.1)
Basophils Relative: 0 %
Eosinophils Absolute: 0 10*3/uL (ref 0.0–0.5)
Eosinophils Relative: 1 %
HCT: 47.3 % (ref 39.0–52.0)
Hemoglobin: 15.3 g/dL (ref 13.0–17.0)
Immature Granulocytes: 0 %
Lymphocytes Relative: 15 %
Lymphs Abs: 1.1 10*3/uL (ref 0.7–4.0)
MCH: 31.8 pg (ref 26.0–34.0)
MCHC: 32.3 g/dL (ref 30.0–36.0)
MCV: 98.3 fL (ref 80.0–100.0)
Monocytes Absolute: 0.5 10*3/uL (ref 0.1–1.0)
Monocytes Relative: 7 %
Neutro Abs: 5.6 10*3/uL (ref 1.7–7.7)
Neutrophils Relative %: 77 %
Platelets: 286 10*3/uL (ref 150–400)
RBC: 4.81 MIL/uL (ref 4.22–5.81)
RDW: 12.2 % (ref 11.5–15.5)
WBC: 7.3 10*3/uL (ref 4.0–10.5)
nRBC: 0 % (ref 0.0–0.2)

## 2019-02-23 LAB — COMPREHENSIVE METABOLIC PANEL
ALT: 14 U/L (ref 0–44)
AST: 15 U/L (ref 15–41)
Albumin: 3.8 g/dL (ref 3.5–5.0)
Alkaline Phosphatase: 57 U/L (ref 38–126)
Anion gap: 9 (ref 5–15)
BUN: 7 mg/dL (ref 6–20)
CO2: 24 mmol/L (ref 22–32)
Calcium: 9.2 mg/dL (ref 8.9–10.3)
Chloride: 103 mmol/L (ref 98–111)
Creatinine, Ser: 0.94 mg/dL (ref 0.61–1.24)
GFR calc Af Amer: >60 mL/min (ref >60)
GFR calc non Af Amer: >60 mL/min (ref >60)
Glucose, Bld: 148 mg/dL — ABNORMAL HIGH (ref 70–99)
Potassium: 4 mmol/L (ref 3.5–5.1)
Sodium: 136 mmol/L (ref 135–145)
Total Bilirubin: 0.5 mg/dL (ref 0.3–1.2)
Total Protein: 7.5 g/dL (ref 6.5–8.1)

## 2019-02-23 LAB — SODIUM: Sodium: 132 mmol/L — ABNORMAL LOW (ref 135–145)

## 2019-02-23 LAB — TROPONIN I: Troponin I: <0.03 ng/mL (ref <0.03)

## 2019-02-23 LAB — RAPID URINE DRUG SCREEN, HOSP PERFORMED
Amphetamines: NOT DETECTED
Barbiturates: NOT DETECTED
Benzodiazepines: NOT DETECTED
Cocaine: NOT DETECTED
Opiates: NOT DETECTED
Tetrahydrocannabinol: NOT DETECTED

## 2019-02-23 LAB — LIPASE, BLOOD: Lipase: 24 U/L (ref 11–51)

## 2019-02-23 LAB — APTT: aPTT: 37 seconds — ABNORMAL HIGH (ref 24–36)

## 2019-02-23 MED ORDER — EMPTY CONTAINERS FLEXIBLE MISC
900.0000 mg | Freq: Once | Status: DC
  Filled 2019-02-23: qty 90

## 2019-02-23 MED ORDER — ACETAMINOPHEN 160 MG/5ML PO SOLN: 650.0000 mg | ORAL | Status: DC | PRN

## 2019-02-23 MED ORDER — ACETAMINOPHEN 650 MG RE SUPP: 650.0000 mg | RECTAL | Status: DC | PRN

## 2019-02-23 MED ORDER — SODIUM CHLORIDE (PF) 0.9 % IJ SOLN
INTRAMUSCULAR | Status: AC
  Filled 2019-02-23: qty 50

## 2019-02-23 MED ORDER — PANTOPRAZOLE SODIUM 40 MG IV SOLR: 40.0000 mg | Freq: Every day | INTRAVENOUS | Status: DC

## 2019-02-23 MED ORDER — PROTHROMBIN COMPLEX CONC HUMAN 500 UNITS IV KIT
50.0000 [IU]/kg | PACK | Status: DC
  Filled 2019-02-23: qty 3970

## 2019-02-23 MED ORDER — STROKE: EARLY STAGES OF RECOVERY BOOK
Freq: Once | Status: AC
  Administered 2019-02-23: 21:00:00

## 2019-02-23 MED ORDER — ONDANSETRON HCL 4 MG/2ML IJ SOLN
4.0000 mg | Freq: Once | INTRAMUSCULAR | Status: AC
  Administered 2019-02-23: 17:00:00 4 mg via INTRAVENOUS
  Filled 2019-02-23: qty 2

## 2019-02-23 MED ORDER — SENNOSIDES-DOCUSATE SODIUM 8.6-50 MG PO TABS
1.0000 | ORAL_TABLET | Freq: Two times a day (BID) | ORAL | Status: DC
  Administered 2019-02-23 – 2019-03-01 (×10): 1 via ORAL
  Filled 2019-02-23 (×10): qty 1

## 2019-02-23 MED ORDER — ACETAMINOPHEN 325 MG PO TABS: 650.0000 mg | ORAL_TABLET | ORAL | Status: DC | PRN

## 2019-02-23 MED ORDER — CLEVIDIPINE BUTYRATE 0.5 MG/ML IV EMUL
0.0000 mg/h | INTRAVENOUS | Status: DC
  Administered 2019-02-23: 1 mg/h via INTRAVENOUS
  Filled 2019-02-23 (×2): qty 50

## 2019-02-23 MED ORDER — SODIUM CHLORIDE 3 % IV SOLN
INTRAVENOUS | Status: DC
  Administered 2019-02-23 – 2019-02-24 (×2): 75 mL/h via INTRAVENOUS
  Administered 2019-02-25: 17 mL/h via INTRAVENOUS
  Filled 2019-02-23 (×6): qty 500

## 2019-02-23 MED ORDER — STROKE: EARLY STAGES OF RECOVERY BOOK: Freq: Once | Status: DC

## 2019-02-23 MED ORDER — ACETAMINOPHEN 325 MG PO TABS
650.0000 mg | ORAL_TABLET | ORAL | Status: DC | PRN
  Administered 2019-02-23 – 2019-03-01 (×11): 650 mg via ORAL
  Filled 2019-02-23 (×12): qty 2

## 2019-02-23 MED ORDER — EMPTY CONTAINERS FLEXIBLE MISC
900.0000 mg | Freq: Once | Status: AC
  Administered 2019-02-23: 20:00:00 900 mg via INTRAVENOUS
  Filled 2019-02-23: qty 90

## 2019-02-23 MED ORDER — SENNOSIDES-DOCUSATE SODIUM 8.6-50 MG PO TABS: 1.0000 | ORAL_TABLET | Freq: Two times a day (BID) | ORAL | Status: DC

## 2019-02-23 MED ORDER — PANTOPRAZOLE SODIUM 40 MG IV SOLR
40.0000 mg | Freq: Every day | INTRAVENOUS | Status: DC
  Administered 2019-02-23 – 2019-02-25 (×3): 40 mg via INTRAVENOUS
  Filled 2019-02-23 (×4): qty 40

## 2019-02-23 MED ORDER — LABETALOL HCL 5 MG/ML IV SOLN
20.0000 mg | Freq: Once | INTRAVENOUS | Status: DC
  Filled 2019-02-23: qty 4

## 2019-02-23 MED ORDER — METOPROLOL TARTRATE 25 MG PO TABS
25.0000 mg | ORAL_TABLET | Freq: Two times a day (BID) | ORAL | Status: DC
  Administered 2019-02-23 – 2019-03-01 (×11): 25 mg via ORAL
  Filled 2019-02-23 (×11): qty 1

## 2019-02-23 MED ORDER — CLEVIDIPINE BUTYRATE 0.5 MG/ML IV EMUL
0.0000 mg/h | INTRAVENOUS | Status: DC
  Administered 2019-02-23: 10 mg/h via INTRAVENOUS
  Administered 2019-02-23: 12 mg/h via INTRAVENOUS
  Administered 2019-02-24: 6 mg/h via INTRAVENOUS
  Administered 2019-02-24: 14 mg/h via INTRAVENOUS
  Administered 2019-02-24: 10 mg/h via INTRAVENOUS
  Administered 2019-02-24: 5 mg/h via INTRAVENOUS
  Administered 2019-02-24: 8 mg/h via INTRAVENOUS
  Administered 2019-02-25: 3 mg/h via INTRAVENOUS
  Administered 2019-02-25: 4 mg/h via INTRAVENOUS
  Administered 2019-02-25: 12 mg/h via INTRAVENOUS
  Administered 2019-02-25 – 2019-02-26 (×2): 2 mg/h via INTRAVENOUS
  Filled 2019-02-23 (×10): qty 50

## 2019-02-23 MED ORDER — IOHEXOL 350 MG/ML SOLN
100.0000 mL | Freq: Once | INTRAVENOUS | Status: AC | PRN
  Administered 2019-02-23: 20:00:00 100 mL via INTRAVENOUS

## 2019-02-23 NOTE — ED Provider Notes (Signed)
[facility]-EMERGENCY DEPT Provider Note   CSN: [id number] Arrival date & time: 02/23/19  1604    History   Chief Complaint Chief Complaint  Patient presents with   Emesis   Dizziness    HPI Cody Porter is a 58 y.o. male.     HPI  Patient is a 58 year old male with a history of EtOH abuse, bilateral DVTs/PE, hypertension, who presents to the emergency department today for evaluation of lightheadedness/dizziness that began yesterday. States he feels near syncopal when he tries to walk. His symptoms resolve when he is sitting.  Symptoms are associated with nausea and vomiting.  Has had 3 episodes today. States he had one episode of bright red blood in his stool last night. He has h/o hemorrhoids. Has had no further episodes. No hematemesis. No diarrhea, constipation, or dark/tarry stools. No abd pain, fevers, dysuria, frequency, hematuria or urgency. Last night had right sided HA that lasted about 1 hour. Felt exactly the same as chronic headaches that he has about 2-3x/week. Denies current headache, vision changes, numbness/weakness. Denies chest pain, sob, dyspnea on exertion, or pain with inspiration.   States about 2-3 days ago he got dizzy and had a syncopal event. States he fell backwards and hit his head.   On review of prior records, pt was recently admitted to the ED on 3/17 for VTE. He was discahrged on 3/23 on eliquis and metoprolol. Has been compliant with eliquis. Takes metop at night and has not taken today.   States he has not had ETOH since 3/17. No drug use.   Past Medical History:  Diagnosis Date   ETOH abuse 01/28/2019   Leg DVT (deep venous thromboembolism), acute, bilateral (HCC) 01/28/2019   Pulmonary embolus (HCC) 01/28/2019    Patient Active Problem List   Diagnosis Date Noted   ICH (intracerebral hemorrhage) (HCC) 02/23/2019   ETOH abuse 01/28/2019   Pulmonary embolus (HCC) 01/28/2019   Leg DVT (deep venous  thromboembolism), acute, bilateral (HCC) 01/28/2019   Acute pulmonary embolism (HCC) 01/28/2019    Past Surgical History:  Procedure Laterality Date   HERNIA REPAIR     TONSILLECTOMY          Home Medications    Prior to Admission medications   Medication Sig Start Date End Date Taking? Authorizing Provider  apixaban (ELIQUIS) 5 MG TABS tablet Take 1 tablet (5 mg total) by mouth 2 (two) times daily for 30 days. 02/17/19 03/19/19 Yes Purohit, Salli Quarry, MD  metoprolol tartrate (LOPRESSOR) 25 MG tablet Take 1 tablet (25 mg total) by mouth 2 (two) times daily for 30 days. 02/03/19 03/05/19 Yes Purohit, Salli Quarry, MD  apixaban (ELIQUIS) 5 MG TABS tablet Take 2 tablets (10 mg total) by mouth 2 (two) times daily for 13 days. 02/03/19 02/16/19  Purohit, Salli Quarry, MD    Family History Family History  Family history unknown: Yes    Social History Social History   Tobacco Use   Smoking status: Current Every Day Smoker    Packs/day: 0.75    Types: Cigarettes   Smokeless tobacco: Never Used  Substance Use Topics   Alcohol use: Not Currently    Alcohol/week: 14.0 standard drinks    Types: 14 Cans of beer per week    Comment: Patient states no alcohol x 1 month   Drug use: Not Currently    Types: Marijuana     Allergies   Patient has no known allergies.   Review of Systems Review  of Systems  Constitutional: Negative for diaphoresis and fever.  HENT: Negative for sore throat.   Eyes: Negative for pain and visual disturbance.  Respiratory: Negative for cough and shortness of breath.   Cardiovascular: Positive for leg swelling (improving). Negative for chest pain and palpitations.  Gastrointestinal: Positive for blood in stool, nausea and vomiting. Negative for abdominal pain, constipation and diarrhea.  Genitourinary: Negative for dysuria and hematuria.  Musculoskeletal: Negative for back pain.  Skin: Negative for rash.  Neurological: Positive for dizziness and light-headedness.  Negative for seizures, syncope, speech difficulty, weakness and numbness.       Near syncope  All other systems reviewed and are negative.   Physical Exam Updated Vital Signs BP 132/86    Pulse (!) 101    Temp 98 F (36.7 C) (Oral)    Resp 19    Ht  (1.727 m)    Wt 79.4 kg    SpO2 97%    BMI 26.61 kg/m   Physical Exam Vitals signs and nursing note reviewed.  Constitutional:      Appearance: He is well-developed.     Comments: Pt standing at bedside putting on gown with normal coordination  HENT:     Head: Normocephalic and atraumatic.     Mouth/Throat:     Mouth: Mucous membranes are moist.  Eyes:     Extraocular Movements: Extraocular movements intact.     Conjunctiva/sclera: Conjunctivae normal.     Pupils: Pupils are equal, round, and reactive to light.     Comments: No nystagmus  Neck:     Musculoskeletal: Neck supple.  Cardiovascular:     Rate and Rhythm: Normal rate and regular rhythm.     Pulses: Normal pulses.     Heart sounds: Normal heart sounds. No murmur.  Pulmonary:     Effort: Pulmonary effort is normal. No respiratory distress.     Breath sounds: Normal breath sounds. No stridor. No wheezing, rhonchi or rales.  Abdominal:     Palpations: Abdomen is soft.     Tenderness: There is no abdominal tenderness.  Genitourinary:    Comments: Chaperone present. No gross blood or melena on rectal exam. Scant amount of yellow/brown stool in rectal vault. External hemorrhoids present, non-bleeding, non-thrombosed. Musculoskeletal:        General: No tenderness.     Right lower leg: Edema present.     Left lower leg: Edema present.     Comments: No CTL spine TTP.   Skin:    General: Skin is warm and dry.  Neurological:     Mental Status: He is alert.     Comments: Mental Status:  Alert, thought content appropriate, able to give a coherent history. Speech fluent without evidence of aphasia. Able to follow 2 step commands without difficulty.  Cranial Nerves:  II:  pupils equal, round, reactive to light III,IV, VI: ptosis not present, extra-ocular motions intact bilaterally  V,VII: smile symmetric, facial light touch sensation equal VIII: hearing grossly normal to voice  X: uvula elevates symmetrically  XI: bilateral shoulder shrug symmetric and strong XII: midline tongue extension without fassiculations Motor:  Normal tone. 5/5 strength of BUE and BLE major muscle groups including strong and equal grip strength and dorsiflexion/plantar flexion Sensory: light touch normal in all extremities. Cerebellar: normal finger-to-nose with bilateral upper extremities    ED Treatments / Results  Labs (all labs ordered are listed, but only abnormal results are displayed) Labs Reviewed  COMPREHENSIVE METABOLIC PANEL - Abnormal; Notable  for the following components:      Result Value   Glucose, Bld 148 (*)    All other components within normal limits  URINALYSIS, ROUTINE W REFLEX MICROSCOPIC - Abnormal; Notable for the following components:   Ketones, ur 5 (*)    All other components within normal limits  APTT - Abnormal; Notable for the following components:   aPTT 37 (*)    All other components within normal limits  SODIUM - Abnormal; Notable for the following components:   Sodium 132 (*)    All other components within normal limits  MRSA PCR SCREENING  CBC WITH DIFFERENTIAL/PLATELET  LIPASE, BLOOD  TROPONIN I  PROTIME-INR  RAPID URINE DRUG SCREEN, HOSP PERFORMED  HIV ANTIBODY (ROUTINE TESTING W REFLEX)  SODIUM  SODIUM  POC OCCULT BLOOD, ED    EKG None  Radiology Ct Angio Head W Or Wo Contrast  Result Date: 02/23/2019 CLINICAL DATA:  58 y/o  M; cerebral hemorrhage. EXAM: CT ANGIOGRAPHY HEAD TECHNIQUE: Multidetector CT imaging of the head was performed using the standard protocol during bolus administration of intravenous contrast. Multiplanar CT image reconstructions and MIPs were obtained to evaluate the vascular anatomy. CONTRAST:   OMNIPAQUE IOHEXOL 350 MG/ML SOLN COMPARISON:  02/23/2019 CT head. FINDINGS: CTA HEAD Anterior circulation: No significant stenosis, proximal occlusion, aneurysm, or vascular malformation. Mild non stenotic calcific atherosclerosis of carotid siphons. Posterior circulation: No significant stenosis, proximal occlusion, aneurysm, or vascular malformation. Venous sinuses: As permitted by contrast timing, patent. Anatomic variants: Large left A1 and anterior communicating artery, small right A1, normal variant. Small right and probable diminutive left posterior communicating arteries. Delayed phase: No abnormal intracranial enhancement. IMPRESSION: 1. Patent anterior and posterior intracranial circulation. No large vessel occlusion, aneurysm, vascular malformation, or significant stenosis. 2. Stable acute hematoma within the bilateral cerebellar hemispheres and subacute hemorrhage within the left external capsule. No active extravasation or enhancing lesion identified. Electronically Signed   By: Mitzi Hansen M.D.   On: 02/23/2019 20:30   Ct Head Wo Contrast  Result Date: 02/23/2019 CLINICAL DATA:  Emesis and dizziness since this morning. EXAM: CT HEAD WITHOUT CONTRAST TECHNIQUE: Contiguous axial images were obtained from the base of the skull through the vertex without intravenous contrast. COMPARISON:  Head CT dated 01/31/2019. FINDINGS: Brain: 2 foci of acute hemorrhage within the cerebellum. The cerebellar hemorrhage to the RIGHT of midline measures 3.1 x 2.7 cm. The smaller cerebellar hemorrhage to the LEFT of midline measures 2.3 x 1.8 cm. Edema surrounds both sites of hemorrhage with associated local mass effect (sulcal effacement). No midline shift or herniation. Questionable small acute to subacute hemorrhage within the LEFT external capsule. No additional parenchymal hemorrhage. No extra-axial hemorrhage. No hydrocephalus. Mild chronic small vessel ischemic changes within the deep  periventricular white matter regions bilaterally. Vascular: No hyperdense vessel or unexpected calcification. Skull: Normal. Negative for fracture or focal lesion. Sinuses/Orbits: No acute finding. Other: None. IMPRESSION: 1. Two foci of acute hemorrhage within the cerebellum, largest measuring 3 cm greatest dimension. Associated edema at both sites of hemorrhage causing local mass effect with sulcal effacement. No midline shift or evidence of herniation. Distribution suggest hypertensive hemorrhage, possibly complicated by recent commencement of Eliquis. 2. Small acute to subacute hemorrhage within the LEFT external capsule. 3. Mild chronic small vessel ischemic changes within the periventricular white matter. Critical Value/emergent results were called by telephone at the time of interpretation on 02/23/2019 at 6:20 pm to Dr. Leonia Corona , who verbally acknowledged these results. Electronically  Signed   By: Bary Richard M.D.   On: 02/23/2019 18:23    Procedures Procedures (including critical care time)  CRITICAL CARE Performed by: Karrie Meres   Total critical care time: 40 minutes  Critical care time was exclusive of separately billable procedures and treating other patients.  Critical care was necessary to treat or prevent imminent or life-threatening deterioration.  Critical care was time spent personally by me on the following activities: development of treatment plan with patient and/or surrogate as well as nursing, discussions with consultants, evaluation of patient's response to treatment, examination of patient, obtaining history from patient or surrogate, ordering and performing treatments and interventions, ordering and review of laboratory studies, ordering and review of radiographic studies, pulse oximetry and re-evaluation of patient's condition.   Medications Ordered in ED Medications  acetaminophen (TYLENOL) tablet 650 mg (650 mg Oral Given 02/23/19 2136)    Or    acetaminophen (TYLENOL) solution 650 mg ( Per Tube See Alternative 02/23/19 2136)    Or  acetaminophen (TYLENOL) suppository 650 mg ( Rectal See Alternative 02/23/19 2136)  senna-docusate (Senokot-S) tablet 1 tablet (1 tablet Oral Given 02/23/19 2138)  pantoprazole (PROTONIX) injection 40 mg (40 mg Intravenous Given 02/23/19 2136)  labetalol (NORMODYNE,TRANDATE) injection 20 mg (20 mg Intravenous Not Given 02/23/19 2141)    And  clevidipine (CLEVIPREX) infusion 0.5 mg/mL (12 mg/hr Intravenous Rate/Dose Verify 02/23/19 2300)  sodium chloride (PF) 0.9 % injection (has no administration in time range)  metoprolol tartrate (LOPRESSOR) tablet 25 mg (25 mg Oral Given 02/23/19 2138)  sodium chloride (hypertonic) 3 % solution ( Intravenous Rate/Dose Verify 02/23/19 2300)  ondansetron (ZOFRAN) injection 4 mg (4 mg Intravenous Given 02/23/19 1724)   stroke: mapping our early stages of recovery book ( Does not apply Given 02/23/19 2100)  coag fact Xa recombinant (ANDEXXA) low dose infusion 900 mg (900 mg Intravenous New Bag/Given 02/23/19 2009)  iohexol (OMNIPAQUE) 350 MG/ML injection 100 mL (100 mLs Intravenous Contrast Given 02/23/19 1948)     Initial Impression / Assessment and Plan / ED Course  I have reviewed the triage vital signs and the nursing notes.  Pertinent labs & imaging results that were available during my care of the patient were reviewed by me and considered in my medical decision making (see chart for details).      Final Clinical Impressions(s) / ED Diagnoses   Final diagnoses:  Nontraumatic intracerebral hemorrhage of cerebellum, unspecified laterality (HCC)   Pt presenting with c/o lightheadedness/dizziness, nausea, and vomiting starting last night. Recent syncopal event and fall with head trauma about 2-3 days ago (on eliquis).   Hypertensive on arrival. Mildly hypothermic. Otherwise vitals are WNL.   Cardiac and pulm exam wnl. Peripheral edema noted, but improving per patient.  Abdomen soft and nontender with normoactive BS. Neuro exam is nonfocal.   CBC with no leukocytosis, normal hgb.  CMP wnl Lipase wnl Trop wnl Hemoccult negative APTT slightly elevated Pt-INR WNL UA pending at time of admission   EKG with NSR, LAE, Old anteroseptal infarct, and baseline wander leads in V2. No ischemic changes or arrhythmia.   CT head two foci of acute hemorrhage within the cerebellum, largest measuring 3 cm greatest dimension. Associated edema at both sites of hemorrhage causing local mass effect with sulcal effacement. No midline shift or evidence of herniation. Distribution suggest hypertensive hemorrhage, possibly complicated by recent commencement of Eliquis. 2. Small acute to subacute hemorrhage within the LEFT external capsule. 3. Mild chronic small vessel ischemic  changes within the periventricular white matter.   Cleviprex drip ordered for BP control. Eliquis reversal agent ordered. Neuro consulted, Dr. Laurence SlateAroor recommends tight BP control <140 systolic. [facility] and [facility] to neuro ICU. He will place bed request.  He will page Dr. Wilford CornerArora to see pt in ED [facility].   Dr. Laurence SlateAroor evaluated pt in ED. [facility]   Pt seen in conjunction with Dr. Clarene DukeLittle who evaluated pt an is in agreement with plan.   ED Discharge Orders    None       Rayne DuCouture, Shaquille Murdy S, PA-C 02/23/19 2341    Little, Ambrose Finlandachel Morgan, MD 03/03/19 [phone]

## 2019-02-23 NOTE — ED Triage Notes (Signed)
Patient c/o emesis and dizziness since this AM."

## 2019-02-23 NOTE — H&P (Signed)
Neurology Consultation  CC: Dizziness, nausea, vomiting  History is obtained from: Patient, EDP, chart review  HPI: Cody Porter is a 58 y.o. male past medical history of PE diagnosed in March started on Eliquis, DVT, paroxysmal atrial fibrillation, alcohol abuse, presented to the emergency room for evaluation of dizziness. According to the patient, he was starting to feel unwell yesterday for 10/03/2019 around 6 PM when he had a couple of bouts of vomiting.  He went to bed at that time not making much of what ever was going on.  He woke up at 4 AM with worsened dizziness that he describes as room spinning.  He was also very nauseous and had another episode of vomiting.  He then had a syncopal episode, which prompted coming to the ER. Also of note, he has been having a headache which is not usual for him. Denies chest pain shortness of breath.  Denies sick contact.  Denies fevers chills.  Denies abdominal pain.  Reports nausea reports vomiting.  Reports headache.  Denies visual symptoms.  Reports dizziness.  He was unable to tell me exactly when he took his last dose of Eliquis-this was either last night or this morning.  For me, he did say that he took it last night but to the ER he said he had taken it last evening.  He could not really tell me an exact time.  Based on what he had initially said, we would imagine that his last dose was sometime this morning between 4 and 8 AM. Blood pressures on arrival were high in the 180s. Phone consultation was made with the on-call day MD from neurology who recommended Cleviprex for blood pressure control and reversal of Eliquis.  Initially Theodoro Parma was ordered but on my evaluation and getting history of Eliquis, I recommend reversal with Andexxa.  LKW: 6 PM on 02/22/2019 tpa given?: no, ICH Premorbid modified Rankin scale (mRS):0 ICH Score: 1 for infratentorial origin   ROS: ROS was performed and is negative except as noted in the HPI.   Past Medical  History:  Diagnosis Date  . ETOH abuse 01/28/2019  . Leg DVT (deep venous thromboembolism), acute, bilateral (HCC) 01/28/2019  . Pulmonary embolus (HCC) 01/28/2019    Family History  Family history unknown: Yes   Family history of strokes  Social History:   reports that he has been smoking cigarettes. He has been smoking about 0.75 packs per day. He has never used smokeless tobacco. He reports previous alcohol use of about 14.0 standard drinks of alcohol per week. He reports previous drug use. Drug: Marijuana.  Medications  Current Facility-Administered Medications:  .   stroke: mapping our early stages of recovery book, , Does not apply, Once, Aroor, Dara Lords, MD .   stroke: mapping our early stages of recovery book, , Does not apply, Once, Milon Dikes, MD .  acetaminophen (TYLENOL) tablet 650 mg, 650 mg, Oral, Q4H PRN **OR** acetaminophen (TYLENOL) solution 650 mg, 650 mg, Per Tube, Q4H PRN **OR** acetaminophen (TYLENOL) suppository 650 mg, 650 mg, Rectal, Q4H PRN, Aroor, Dara Lords, MD .  acetaminophen (TYLENOL) tablet 650 mg, 650 mg, Oral, Q4H PRN **OR** acetaminophen (TYLENOL) solution 650 mg, 650 mg, Per Tube, Q4H PRN **OR** acetaminophen (TYLENOL) suppository 650 mg, 650 mg, Rectal, Q4H PRN, Milon Dikes, MD .  clevidipine (CLEVIPREX) infusion 0.5 mg/mL, 0-21 mg/hr, Intravenous, Continuous, Couture, Cortni S, PA-C, Last Rate: 16 mL/hr at 02/23/19 1914, 8 mg/hr at 02/23/19 1914 .  labetalol (NORMODYNE,TRANDATE) injection  20 mg, 20 mg, Intravenous, Once **AND** clevidipine (CLEVIPREX) infusion 0.5 mg/mL, 0-21 mg/hr, Intravenous, Continuous, Milon DikesArora, Cayley Pester, MD .  coag fact Xa recombinant (ANDEXXA) low dose infusion 900 mg, 900 mg, Intravenous, Once, Milon DikesArora, Areona Homer, MD .  iohexol (OMNIPAQUE) 350 MG/ML injection 100 mL, 100 mL, Intravenous, Once PRN, Milon DikesArora, Kariem Wolfson, MD .  pantoprazole (PROTONIX) injection 40 mg, 40 mg, Intravenous, QHS, Aroor, Georgiana SpinnerSushanth R, MD .  pantoprazole (PROTONIX)  injection 40 mg, 40 mg, Intravenous, QHS, Milon DikesArora, Shilah Hefel, MD .  senna-docusate (Senokot-S) tablet 1 tablet, 1 tablet, Oral, BID, Aroor, Dara LordsSushanth R, MD .  senna-docusate (Senokot-S) tablet 1 tablet, 1 tablet, Oral, BID, Milon DikesArora, Talis Iwan, MD .  sodium chloride (PF) 0.9 % injection, , , ,   Current Outpatient Medications:  .  apixaban (ELIQUIS) 5 MG TABS tablet, Take 1 tablet (5 mg total) by mouth 2 (two) times daily for 30 days., Disp: 60 tablet, Rfl: 0 .  metoprolol tartrate (LOPRESSOR) 25 MG tablet, Take 1 tablet (25 mg total) by mouth 2 (two) times daily for 30 days., Disp: 60 tablet, Rfl: 1 .  apixaban (ELIQUIS) 5 MG TABS tablet, Take 2 tablets (10 mg total) by mouth 2 (two) times daily for 13 days., Disp: 52 tablet, Rfl: 0  Exam: Current vital signs: BP (!) 154/85   Pulse 82   Temp 97.8 F (36.6 C) (Oral)   Resp 15   Ht 5\' 8"  (1.727 m)   Wt 79.4 kg   SpO2 98%   BMI 26.61 kg/m  Vital signs in last 24 hours: Temp:  [97.5 F (36.4 C)-97.8 F (36.6 C)] 97.8 F (36.6 C) (04/12 1824) Pulse Rate:  [61-82] 82 (04/12 1854) Resp:  [15-18] 15 (04/12 1854) BP: (154-195)/(85-92) 154/85 (04/12 1854) SpO2:  [98 %-100 %] 98 % (04/12 1854) Weight:  [79.4 kg] 79.4 kg (04/12 1613) General: Awake alert in no distress HEENT: Normocephalic atraumatic dry mucous membranes Lungs: Clear to auscultation bilaterally with no wheezing Cardiovascular: S1-S2 heard regular rate rhythm Abdomen: Soft nondistended nontender Extremities: Warm well perfused with intact peripheral pulses, there is edema in both lower extremities Neurological exam He is awake alert oriented x3 His attention and concentration is mildly reduced Naming comprehension repetition intact Speech is clear Cranial nerves: Pupils equal round react light, extraocular movements intact, visual fields are full, face appears to be symmetric, auditory acuity appears to be reduced bilaterally, palate midline, tongue midline. Motor exam: Symmetric  antigravity in all 4 extremities. Sensory exam: Intact light touch all over without extinction Coordination: Intact finger-nose-finger bilaterally Gait testing was deferred at this time  NIHSS-0   Labs I have reviewed labs in epic and the results pertinent to this consultation are:  CBC    Component Value Date/Time   WBC 7.3 02/23/2019 1639   RBC 4.81 02/23/2019 1639   HGB 15.3 02/23/2019 1639   HCT 47.3 02/23/2019 1639   PLT 286 02/23/2019 1639   MCV 98.3 02/23/2019 1639   MCH 31.8 02/23/2019 1639   MCHC 32.3 02/23/2019 1639   RDW 12.2 02/23/2019 1639   LYMPHSABS 1.1 02/23/2019 1639   MONOABS 0.5 02/23/2019 1639   EOSABS 0.0 02/23/2019 1639   BASOSABS 0.0 02/23/2019 1639    CMP     Component Value Date/Time   NA 136 02/23/2019 1639   K 4.0 02/23/2019 1639   CL 103 02/23/2019 1639   CO2 24 02/23/2019 1639   GLUCOSE 148 (H) 02/23/2019 1639   BUN 7 02/23/2019 1639   CREATININE 0.94  02/23/2019 1639   CALCIUM 9.2 02/23/2019 1639   PROT 7.5 02/23/2019 1639   ALBUMIN 3.8 02/23/2019 1639   AST 15 02/23/2019 1639   ALT 14 02/23/2019 1639   ALKPHOS 57 02/23/2019 1639   BILITOT 0.5 02/23/2019 1639   GFRNONAA >60 02/23/2019 1639   GFRAA >60 02/23/2019 1639   Imaging I have reviewed the images obtained:  CT-scan of the brain bilateral cerebellar hemorrhages with associated edema surrounding with no hydrocephalus, no midline shift.  Questionable small subacute external capsular stroke on the right.  Echocardiogram from 01/28/2019 showed a normal LVEF of 60 to 65%, impaired left ventricular diastolic relaxation, normal mitral valve, normal tricuspid valve, normal aortic valve, interatrial septum not assessed, normal LA size.  Assessment: 58 year old man with paroxysmal atrial fibrillation, DVT and PE on Eliquis, presenting for evaluation of dizziness, headache, nausea and vomiting and a CT scan showing bilateral cerebellar bleeds with no hydrocephalus or midline  shift. Etiology of the bleed is likely hypertension in the setting of coagulopathy from Eliquis.  Blood pressures were high on arrival. Difficult ascertain when his last dose was because he is not a very reliable historian. Andexxa 900 mg IV was given for reversal.  Plan: Cerebellum ICH, nontraumatic  Acuity: Acute Laterality: Bilateral Current suspected etiology: Hypertension, coagulopathy Treatment: -Admit to neurological ICU at [facility] -ICH Score: 1 -BP control goal SYS<140.  Use labetalol pushes and Cleviprex drip. -We will start on 3% -PT/OT/ST  -neuromonitoring -A1c, drug screen, HIV.  CNS Cerebral edema Compression of brain -Hyperosmolar therapy-3% saline -NSGY consult if mentation worsens and for consideration of ventriculostomy if needed.  Hold off for now. -Close neuro monitoring  Dysarthria Dysphagia following ICH  -NPO until cleared by speech -ST  History of alcohol abuse -CIWA  RESP No acute issues for now. Monitor clinically for maintaining saturations over 92%  CV Hypertensive Emergency -Aggressive BP control, goal SBP < 140 -Meds as above  Heart failure, unspecified  Acute systolic (congestive) heart failure  Chronic systolic (congestive) heart failure  Acute on chronic systolic (congestive) heart failure  -TTE -Continue BB -Cards Consult  Paroxysmal atrial -fibrillation -Hold anticoagulation for now -Continue metoprolol 25 twice daily home dose -Decision on resumption of anticoagulation based on clinical course and repeat imaging per stroke team  GI/GU No active issues Gentle hydration  HEME Coagulopathy secondary to Eliquis -Reversed with Andexxa -Monitor clinically -Check labs in the a.m.  ENDO Hyperglycemia -Check labs in the morning -goal HgbA1c < 7  Fluid/Electrolyte Disorders No active issues -Check labs in the a.m. and replete as necessary  ID No active issues -Monitor vitals, CBC, chest x-ray -Obtain  urinalysis  Prophylaxis DVT: SCDs GI: PPI Bowel: Docusate senna  Dispo: To be determined  Diet: NPO until cleared by speech or bedside eval  Code Status: Full Code   THE FOLLOWING WERE PRESENT ON ADMISSION: ICH, cerebral edema, hypertensive emergency, coagulopathy from Eliquis  -- Milon Dikes, MD [facility] Pager: [phone] If 7pm to 7am, please call on call as listed on AMION.  CRITICAL CARE ATTESTATION Performed by: Milon Dikes, MD at [facility] emergency room. Discussed in detail with Dr. Azalia Bilis, ED provider. Total critical care time: 55 minutes Critical care time was exclusive of separately billable procedures and treating other patients and/or supervising APPs/Residents/Students Critical care was necessary to treat or prevent imminent or life-threatening deterioration due to ICH  This patient is critically ill and at significant risk for neurological worsening and/or death and care  requires constant monitoring. Critical care was time spent personally by me on the following activities: development of treatment plan with patient and/or surrogate as well as nursing, discussions with consultants, evaluation of patient's response to treatment, examination of patient, obtaining history from patient or surrogate, ordering and performing treatments and interventions, ordering and review of laboratory studies, ordering and review of radiographic studies, pulse oximetry, re-evaluation of patient's condition, participation in multidisciplinary rounds and medical decision making of high complexity in the care of this patient.

## 2019-02-24 ENCOUNTER — Inpatient Hospital Stay (HOSPITAL_COMMUNITY): Payer: Self-pay

## 2019-02-24 DIAGNOSIS — I614 Nontraumatic intracerebral hemorrhage in cerebellum: Principal | ICD-10-CM

## 2019-02-24 DIAGNOSIS — I2699 Other pulmonary embolism without acute cor pulmonale: Secondary | ICD-10-CM

## 2019-02-24 LAB — SODIUM
Sodium: 140 mmol/L (ref 135–145)
Sodium: 141 mmol/L (ref 135–145)
Sodium: 142 mmol/L (ref 135–145)
Sodium: 138 mmol/L (ref 135–145)

## 2019-02-24 LAB — MRSA PCR SCREENING: MRSA by PCR: NEGATIVE

## 2019-02-24 MED ORDER — GADOBUTROL 1 MMOL/ML IV SOLN
7.5000 mL | Freq: Once | INTRAVENOUS | Status: AC | PRN
  Administered 2019-02-24: 7.5 mL via INTRAVENOUS

## 2019-02-24 MED ORDER — METOPROLOL TARTRATE 25 MG PO TABS: 25.0000 mg | ORAL_TABLET | Freq: Two times a day (BID) | ORAL | Status: DC

## 2019-02-24 NOTE — Progress Notes (Signed)
STROKE TEAM PROGRESS NOTE  HPI ( Dr Wilford Corner ) HPI: Cody Porter is a 58 y.o. male past medical history of PE diagnosed in March started on Eliquis, DVT, paroxysmal atrial fibrillation, alcohol abuse, presented to the emergency room for evaluation of dizziness. According to the patient, he was starting to feel unwell yesterday for 10/03/2019 around 6 PM when he had a couple of bouts of vomiting.  He went to bed at that time not making much of what ever was going on.  He woke up at 4 AM with worsened dizziness that he describes as room spinning.  He was also very nauseous and had another episode of vomiting.  He then had a syncopal episode, which prompted coming to the ER. Also of note, he has been having a headache which is not usual for him. Denies chest pain shortness of breath.  Denies sick contact.  Denies fevers chills.  Denies abdominal pain.  Reports nausea reports vomiting.  Reports headache.  Denies visual symptoms.  Reports dizziness. He was unable to tell me exactly when he took his last dose of Eliquis-this was either last night or this morning.  For me, he did say that he took it last night but to the ER he said he had taken it last evening.  He could not really tell me an exact time.  Based on what he had initially said, we would imagine that his last dose was sometime this morning between 4 and 8 AM. Blood pressures on arrival were high in the 180s. Phone consultation was made with the on-call day MD from neurology who recommended Cleviprex for blood pressure control and reversal of Eliquis.  Initially Theodoro Parma was ordered but on my evaluation and getting history of Eliquis, I recommend reversal with Andexxa.  LKW: 6 PM on 02/22/2019 tpa given?: no, ICH Premorbid modified Rankin scale (mRS):0 ICH Score: 1 for infratentorial origin  INTERVAL HISTORY Patient is doing well neurologically is awake and interactive.  Blood pressure is adequately controlled on Cardene drip.  Hypertonic saline  drip was started yesterday but serum sodium is not yet at goal. he had acute deep vein thrombosis and pulmonary embolism episode a month ago requiring anticoagulation with Eliquis.  He is at high risk and will likely need IVC filter placement as anticoagulation may be too risky to resume.  Vitals:   02/24/19 0400 02/24/19 0500 02/24/19 0600 02/24/19 0700  BP: 125/74 130/72 136/86 125/79  Pulse: 67 65 76 80  Resp:      Temp:      TempSrc:      SpO2: 95% 96% 97% 97%  Weight:      Height:        CBC:  Recent Labs  Lab 02/23/19 1639  WBC 7.3  NEUTROABS 5.6  HGB 15.3  HCT 47.3  MCV 98.3  PLT 286    Basic Metabolic Panel:  Recent Labs  Lab 02/23/19 1639 02/23/19 2156 02/24/19 0528  NA 136 132* 140  K 4.0  --   --   CL 103  --   --   CO2 24  --   --   GLUCOSE 148*  --   --   BUN 7  --   --   CREATININE 0.94  --   --   CALCIUM 9.2  --   --    Lipid Panel: No results found for: CHOL, TRIG, HDL, CHOLHDL, VLDL, LDLCALC HgbA1c: No results found for: HGBA1C Urine Drug Screen:  Component Value Date/Time   LABOPIA NONE DETECTED 02/23/2019 1957   COCAINSCRNUR NONE DETECTED 02/23/2019 1957   LABBENZ NONE DETECTED 02/23/2019 1957   AMPHETMU NONE DETECTED 02/23/2019 1957   THCU NONE DETECTED 02/23/2019 1957   LABBARB NONE DETECTED 02/23/2019 1957    Alcohol Level     Component Value Date/Time   ETH <10 01/28/2019 1554    IMAGING Ct Angio Head W Or Wo Contrast  Result Date: 02/23/2019 CLINICAL DATA:  58 y/o  M; cerebral hemorrhage. EXAM: CT ANGIOGRAPHY HEAD TECHNIQUE: Multidetector CT imaging of the head was performed using the standard protocol during bolus administration of intravenous contrast. Multiplanar CT image reconstructions and MIPs were obtained to evaluate the vascular anatomy. CONTRAST:  OMNIPAQUE IOHEXOL 350 MG/ML SOLN COMPARISON:  02/23/2019 CT head. FINDINGS: CTA HEAD Anterior circulation: No significant stenosis, proximal occlusion, aneurysm, or  vascular malformation. Mild non stenotic calcific atherosclerosis of carotid siphons. Posterior circulation: No significant stenosis, proximal occlusion, aneurysm, or vascular malformation. Venous sinuses: As permitted by contrast timing, patent. Anatomic variants: Large left A1 and anterior communicating artery, small right A1, normal variant. Small right and probable diminutive left posterior communicating arteries. Delayed phase: No abnormal intracranial enhancement. IMPRESSION: 1. Patent anterior and posterior intracranial circulation. No large vessel occlusion, aneurysm, vascular malformation, or significant stenosis. 2. Stable acute hematoma within the bilateral cerebellar hemispheres and subacute hemorrhage within the left external capsule. No active extravasation or enhancing lesion identified. Electronically Signed   By: Mitzi Hansen M.D.   On: 02/23/2019 20:30   Ct Head Wo Contrast  Result Date: 02/24/2019 CLINICAL DATA:  58 y/o  M; intracranial hemorrhage for follow-up EXAM: CT HEAD WITHOUT CONTRAST TECHNIQUE: Contiguous axial images were obtained from the base of the skull through the vertex without intravenous contrast. COMPARISON:  02/23/2019 CT head and CTA head. FINDINGS: Brain: Acute hemorrhage within right cerebellar hemisphere is stable measuring 31 x 24 mm in axial plane (series 3, image 8) and up to 44 mm in coronal plane (series 5, image 52). Acute hemorrhage in left cerebellar hemisphere is stable measuring 23 x 18 mm in axial plane (series 3, image 9). Stable subtle density in left external capsule compatible with subacute hemorrhage. Mass effect from the hematomas within the cerebellum well as associated vasogenic edema results in mild mass effect within the posterior fossa partially effacing the fourth ventricle. No associated hydrocephalus. No new hemorrhage, extra-axial collection, mass effect, stroke, or herniation is identified. Stable nonspecific white matter  hypodensities compatible with chronic microvascular ischemic changes and stable volume loss of the brain. Stable small chronic lacunar infarcts within the bilateral thalami and putamen. Vascular: Calcific atherosclerosis of the carotid siphons. No hyperdense vessel identified. Skull: Normal. Negative for fracture or focal lesion. Sinuses/Orbits: Mild mucosal thickening of the sphenoid sinus and ethmoid air cells. Additional visible paranasal sinuses and the mastoid air cells are normally aerated. Orbits are unremarkable. Other: None. IMPRESSION: Stable acute hemorrhage within the bilateral cerebellar hemispheres and subacute hemorrhage within the left external capsule. Stable associated vasogenic edema and mild local mass effect. No new acute intracranial abnormality identified. Electronically Signed   By: Mitzi Hansen M.D.   On: 02/24/2019 00:27   Ct Head Wo Contrast  Result Date: 02/23/2019 CLINICAL DATA:  Emesis and dizziness since this morning. EXAM: CT HEAD WITHOUT CONTRAST TECHNIQUE: Contiguous axial images were obtained from the base of the skull through the vertex without intravenous contrast. COMPARISON:  Head CT dated 01/31/2019. FINDINGS: Brain: 2 foci of acute  hemorrhage within the cerebellum. The cerebellar hemorrhage to the RIGHT of midline measures 3.1 x 2.7 cm. The smaller cerebellar hemorrhage to the LEFT of midline measures 2.3 x 1.8 cm. Edema surrounds both sites of hemorrhage with associated local mass effect (sulcal effacement). No midline shift or herniation. Questionable small acute to subacute hemorrhage within the LEFT external capsule. No additional parenchymal hemorrhage. No extra-axial hemorrhage. No hydrocephalus. Mild chronic small vessel ischemic changes within the deep periventricular white matter regions bilaterally. Vascular: No hyperdense vessel or unexpected calcification. Skull: Normal. Negative for fracture or focal lesion. Sinuses/Orbits: No acute finding.  Other: None. IMPRESSION: 1. Two foci of acute hemorrhage within the cerebellum, largest measuring 3 cm greatest dimension. Associated edema at both sites of hemorrhage causing local mass effect with sulcal effacement. No midline shift or evidence of herniation. Distribution suggest hypertensive hemorrhage, possibly complicated by recent commencement of Eliquis. 2. Small acute to subacute hemorrhage within the LEFT external capsule. 3. Mild chronic small vessel ischemic changes within the periventricular white matter. Critical Value/emergent results were called by telephone at the time of interpretation on 02/23/2019 at 6:20 pm to Dr. Leonia Corona , who verbally acknowledged these results. Electronically Signed   By: Bary Richard M.D.   On: 02/23/2019 18:23    PHYSICAL EXAM Pleasant middle-age male currently not in distress. . Afebrile. Head is nontraumatic. Neck is supple without bruit.    Cardiac exam no murmur or gallop. Lungs are clear to auscultation. Distal pulses are well felt. Neurological Exam ;  Awake  Alert oriented x 3. Normal speech and language.eye movements full without nystagmus.  Mild saccadic dysmetria on right lateral gaze.  Fundi were not visualized. Vision acuity and fields appear normal. Hearing is significantly diminished bilaterally.  Palatal movements are normal. Face symmetric. Tongue midline. Normal strength, tone, reflexes and coordination. Normal sensation. Gait deferred.  ASSESSMENT/PLAN Mr. AMADEUS OYAMA is a 58 y.o. male with history of HTN, AF, DVT, PE on Eliquis and tobacco abuse who developed vomiting and dizziness at home w/ HA, presenting to ED with syncope. CT showed hmg, reversed w/ Andexxa.  Stroke:  B cerebellar and subacute L external capsule hemorrhage on eliquis, reversed w/ Andexxa and elevated BP  CT head R & L cerebellar hemorrhages w/ edema. Small L external capsule hmg. Small vessel disease.   CTA head & neck no LVO. Stable hmg B cerebellar and L  external capsule  CT head stable B cerebellar hmg and subacute L external capsule hmg. Stable edema and mass effect.  2D Echo 01/29/2019 EF 60-65%. No source of embolus   MRI w/w/o pending   HIV pending   LDL pending   HgbA1c pending   Check hypercoagulable labs in future (recent Andexxa)  SCDs for VTE prophylaxis  Eliquis (apixaban) daily prior to admission, now on No antithrombotic given hmg  Therapy recommendations:  pending   Disposition:  pending   Cerebral Edema Induced Hypernatremia  On 3% protocol via PIV  Na 140  Goal Na 150-155  Check Na q6h  Taper 3% to be off in 24h and Dc  Atrial Fibrillation  Home anticoagulation:  Eliquis (apixaban) daily   Home meds:  Metoprolol 25 bid  B LE DVT PE  B LE DVTs per doppler 01/28/2019 on Eliquis  PE 01/2019 on Eliquis  For IVC Filter today  Hypertensive Emergency  BP as high as 195/86  Home meds:  Metoprolol 25 bid  Put on cleviprex  SBP goal < 140  Stable  since midnight . Long-term BP goal normotensive  Other Stroke Risk Factors  Cigarette smoker. advised to stop smoking  ETOH use, hx ETOH abuse, advised to drink no more than 2 drink(s) a day  Hx marijuana use, UDS neg this admission  Family hx stroke  ? Heart failure    Other Active Problems    Hospital day # 1 I have personally obtained history,examined this patient, reviewed notes, independently viewed imaging studies, participated in medical decision making and plan of care.ROS completed by me personally and pertinent positives fully documented  I have made any additions or clarifications directly to the above note. Marland Kitchen. He presented with bouts of vomiting followed by dizziness and a syncopal event due to posterior fossa intracerebral hemorrhage related to Eliquis coagulopathy.  This was reversed with Andexxa but he remains at high risk for recurrent pulmonary embolism given his recent DVT.  He is too high risk for anticoagulation will  likely benefit from IVC filter.  Will consult pulmonary critical care medicine.  Control of blood pressure with systolic blood pressure goal below 140.  Taper Cardene drip and start oral blood pressure medications.  Taper hypertonic saline drip clinically does not seem to have significant pressure requiring it MRI scan of the brain with and without contrast.  Check hypercoagulable panel labs if positive it may present a therapeutic conundrum as to whether to reinitiate anticoagulation in the future or not. Discussed with Dr. Vassie LollAlva critical care medicine This patient is critically ill and at significant risk of neurological worsening, death and care requires constant monitoring of vital signs, hemodynamics,respiratory and cardiac monitoring, extensive review of multiple databases, frequent neurological assessment, discussion with family, other specialists and medical decision making of high complexity.I have made any additions or clarifications directly to the above note.This critical care time does not reflect procedure time, or teaching time or supervisory time of PA/NP/Med Resident etc but could involve care discussion time.  I spent 30 minutes of neurocritical care time  in the care of  this patient.     Delia HeadyPramod Sethi, MD Medical Director [facility] Pager: [phone] 02/24/2019 1:38 PM   To contact Stroke Continuity provider, please refer to [email or web address]. After hours, contact General Neurology

## 2019-02-24 NOTE — Consult Note (Addendum)
Name: Cody Porter MRN: [id number] DOB: 08-Jan-1961    ADMISSION DATE:  02/23/2019 CONSULTATION DATE:  02/24/2019  REFERRING MD :  Pearlean Brownie, stroke MD  CHIEF COMPLAINT: Dizziness, nausea, vomiting  BRIEF PATIENT DESCRIPTION: 58 year old EtOH user with recent bilateral DVT and PE started on Eliquis and admitted after a fall with cerebellar ICH  SIGNIFICANT EVENTS  3/17 - 3/23 hospital admission for PE/DVT  STUDIES:  3/17 duplex-lateral lower extremity DVT Echo 3/18 normal EF, normal RV [facility] 3/17 CT angiogram bilateral pulmonary embolism, RV/LV ratio 1  Head CT 4/12 acute hemorrhage within bilateral cerebellar hemispheres and subacute hemorrhage within the left external capsule   HISTORY OF PRESENT ILLNESS: 58 year old smoker and heavy drinker admitted 3/17 to 3/23 for acute hypoxia and found to have PE and bilateral lower extremity DVTs.  He was anticoagulated and discharged on apixaban.  Course was complicated by paroxysmal atrial fibrillation and alcohol withdrawal.  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 He was readmitted 4/12 with 1 day history of dizziness, nausea and vomiting.  He reported 2-3 falls and hit the back of his head.  Head CT showed bilateral cerebellar hemorrhages with associated edema, no hydrocephalus or midline shift?  Small acute external capsule stroke on the right.  Eliquis was reversed with andexa, PCCM consulted for further management of PE  States that his last drink was 3/17  PAST MEDICAL HISTORY :   has a past medical history of ETOH abuse (01/28/2019), Leg DVT (deep venous thromboembolism), acute, bilateral (HCC) (01/28/2019), and Pulmonary embolus (HCC) (01/28/2019).  has a past surgical history that includes Tonsillectomy and Hernia repair. Prior to Admission medications   Medication Sig Start Date End Date Taking? Authorizing Provider  apixaban (ELIQUIS) 5 MG TABS tablet Take 1 tablet (5 mg total) by mouth 2 (two) times daily for 30 days. 02/17/19  03/19/19 Yes Purohit, Salli Quarry, MD  metoprolol tartrate (LOPRESSOR) 25 MG tablet Take 1 tablet (25 mg total) by mouth 2 (two) times daily for 30 days. 02/03/19 03/05/19 Yes Purohit, Salli Quarry, MD  apixaban (ELIQUIS) 5 MG TABS tablet Take 2 tablets (10 mg total) by mouth 2 (two) times daily for 13 days. 02/03/19 02/16/19  Purohit, Salli Quarry, MD   No Known Allergies  FAMILY HISTORY:  Family history is unknown by patient. SOCIAL HISTORY:  reports that he has been smoking cigarettes. He has been smoking about 0.75 packs per day. He has never used smokeless tobacco. He reports previous alcohol use of about 14.0 standard drinks of alcohol per week. He reports previous drug use. Drug: Marijuana.  REVIEW OF SYSTEMS:   Constitutional: Negative for fever, chills, weight loss, malaise/fatigue and diaphoresis.  HENT: Negative for hearing loss, ear pain, nosebleeds, congestion, sore throat, neck pain, tinnitus and ear discharge.   Eyes: Negative for blurred vision, double vision, photophobia, pain, discharge and redness.  Respiratory: Negative for cough, hemoptysis, sputum production, shortness of breath, wheezing and stridor.   Cardiovascular: Negative for chest pain, palpitations, orthopnea, claudication, leg swelling and PND.  Gastrointestinal: Negative for heartburn, nausea, vomiting, abdominal pain, diarrhea, constipation, blood in stool and melena.  Genitourinary: Negative for dysuria, urgency, frequency, hematuria and flank pain.  Musculoskeletal: Negative for myalgias, back pain, joint pain and falls.  Skin: Negative for itching and rash.  Neurological: Positive for dizziness and headaches, negative for tingling, tremors, sensory change, speech change, focal weakness, seizures Endo/Heme/Allergies: Negative for environmental allergies and polydipsia. Does not bruise/bleed easily.  SUBJECTIVE:   VITAL SIGNS: Temp:  [  97.5 F (36.4 C)-98.1 F (36.7 C)] 97.8 F (36.6 C) (04/13 0800) Pulse Rate:  [61-116] 86  (04/13 0900) Resp:  [13-29] 19 (04/12 2130) BP: (112-195)/(71-112) 134/82 (04/13 0900) SpO2:  [95 %-100 %] 98 % (04/13 0900) Weight:  [79.4 kg] 79.4 kg (04/12 1613)  PHYSICAL EXAMINATION: General: Appears older than stated age, no respiratory distress Neuro: Able to speak in full sentences, awake, interactive, nonfocal, ataxia + HEENT: Pupils 3 mm bilaterally equally reactive to light, no JVD Cardiovascular: S1-S2 regular Lungs: Clear Abdomen: Soft nontender Musculoskeletal: No deformity Skin: No rash  Recent Labs  Lab 02/23/19 1639 02/23/19 2156 02/24/19 0528  NA 136 132* 140  K 4.0  --   --   CL 103  --   --   CO2 24  --   --   BUN 7  --   --   CREATININE 0.94  --   --   GLUCOSE 148*  --   --    Recent Labs  Lab 02/23/19 1639  HGB 15.3  HCT 47.3  WBC 7.3  PLT 286   Ct Angio Head W Or Wo Contrast  Result Date: 02/23/2019 CLINICAL DATA:  58 y/o  M; cerebral hemorrhage. EXAM: CT ANGIOGRAPHY HEAD TECHNIQUE: Multidetector CT imaging of the head was performed using the standard protocol during bolus administration of intravenous contrast. Multiplanar CT image reconstructions and MIPs were obtained to evaluate the vascular anatomy. CONTRAST:  OMNIPAQUE IOHEXOL 350 MG/ML SOLN COMPARISON:  02/23/2019 CT head. FINDINGS: CTA HEAD Anterior circulation: No significant stenosis, proximal occlusion, aneurysm, or vascular malformation. Mild non stenotic calcific atherosclerosis of carotid siphons. Posterior circulation: No significant stenosis, proximal occlusion, aneurysm, or vascular malformation. Venous sinuses: As permitted by contrast timing, patent. Anatomic variants: Large left A1 and anterior communicating artery, small right A1, normal variant. Small right and probable diminutive left posterior communicating arteries. Delayed phase: No abnormal intracranial enhancement. IMPRESSION: 1. Patent anterior and posterior intracranial circulation. No large vessel occlusion, aneurysm,  vascular malformation, or significant stenosis. 2. Stable acute hematoma within the bilateral cerebellar hemispheres and subacute hemorrhage within the left external capsule. No active extravasation or enhancing lesion identified. Electronically Signed   By: Mitzi Hansen M.D.   On: 02/23/2019 20:30   Ct Head Wo Contrast  Result Date: 02/24/2019 CLINICAL DATA:  58 y/o  M; intracranial hemorrhage for follow-up EXAM: CT HEAD WITHOUT CONTRAST TECHNIQUE: Contiguous axial images were obtained from the base of the skull through the vertex without intravenous contrast. COMPARISON:  02/23/2019 CT head and CTA head. FINDINGS: Brain: Acute hemorrhage within right cerebellar hemisphere is stable measuring 31 x 24 mm in axial plane (series 3, image 8) and up to 44 mm in coronal plane (series 5, image 52). Acute hemorrhage in left cerebellar hemisphere is stable measuring 23 x 18 mm in axial plane (series 3, image 9). Stable subtle density in left external capsule compatible with subacute hemorrhage. Mass effect from the hematomas within the cerebellum well as associated vasogenic edema results in mild mass effect within the posterior fossa partially effacing the fourth ventricle. No associated hydrocephalus. No new hemorrhage, extra-axial collection, mass effect, stroke, or herniation is identified. Stable nonspecific white matter hypodensities compatible with chronic microvascular ischemic changes and stable volume loss of the brain. Stable small chronic lacunar infarcts within the bilateral thalami and putamen. Vascular: Calcific atherosclerosis of the carotid siphons. No hyperdense vessel identified. Skull: Normal. Negative for fracture or focal lesion. Sinuses/Orbits: Mild mucosal thickening of the sphenoid  sinus and ethmoid air cells. Additional visible paranasal sinuses and the mastoid air cells are normally aerated. Orbits are unremarkable. Other: None. IMPRESSION: Stable acute hemorrhage within the  bilateral cerebellar hemispheres and subacute hemorrhage within the left external capsule. Stable associated vasogenic edema and mild local mass effect. No new acute intracranial abnormality identified. Electronically Signed   By: Mitzi HansenLance  Furusawa-Stratton M.D.   On: 02/24/2019 00:27   Ct Head Wo Contrast  Result Date: 02/23/2019 CLINICAL DATA:  Emesis and dizziness since this morning. EXAM: CT HEAD WITHOUT CONTRAST TECHNIQUE: Contiguous axial images were obtained from the base of the skull through the vertex without intravenous contrast. COMPARISON:  Head CT dated 01/31/2019. FINDINGS: Brain: 2 foci of acute hemorrhage within the cerebellum. The cerebellar hemorrhage to the RIGHT of midline measures 3.1 x 2.7 cm. The smaller cerebellar hemorrhage to the LEFT of midline measures 2.3 x 1.8 cm. Edema surrounds both sites of hemorrhage with associated local mass effect (sulcal effacement). No midline shift or herniation. Questionable small acute to subacute hemorrhage within the LEFT external capsule. No additional parenchymal hemorrhage. No extra-axial hemorrhage. No hydrocephalus. Mild chronic small vessel ischemic changes within the deep periventricular white matter regions bilaterally. Vascular: No hyperdense vessel or unexpected calcification. Skull: Normal. Negative for fracture or focal lesion. Sinuses/Orbits: No acute finding. Other: None. IMPRESSION: 1. Two foci of acute hemorrhage within the cerebellum, largest measuring 3 cm greatest dimension. Associated edema at both sites of hemorrhage causing local mass effect with sulcal effacement. No midline shift or evidence of herniation. Distribution suggest hypertensive hemorrhage, possibly complicated by recent commencement of Eliquis. 2. Small acute to subacute hemorrhage within the LEFT external capsule. 3. Mild chronic small vessel ischemic changes within the periventricular white matter. Critical Value/emergent results were called by telephone at the time  of interpretation on 02/23/2019 at 6:20 pm to Dr. Leonia CoronaORTNI COUTURE , who verbally acknowledged these results. Electronically Signed   By: Bary RichardStan  Maynard M.D.   On: 02/23/2019 18:23    ASSESSMENT / PLAN:  Bilateral cerebellar hemorrhage on Eliquis, question traumatic with high fall risk Recent bone embolism/bilateral DVT  -Obviously he is not a candidate for further anticoagulation -Proceed with placement of IVC filter, would place removable type and then reassess in 6 weeks .  Even if hemorrhage improves, I do feel that given his history of drinking he would be high fall risk  Would watch for alcohol withdrawal.  He does state that his last drink was 3/17 so hopefully this should not be an issue this admission  PCCM available as needed  Cyril Mourningakesh Analaura Messler MD. FCCP. [facility] Pager [phone] If no response call [phone]    02/24/2019, 9:31 AM

## 2019-02-24 NOTE — Progress Notes (Signed)
PT Cancellation Note  Patient Details Name: Cody Porter MRN: [id number] DOB: [birth year]   Cancelled Treatment:    Reason Eval/Treat Not Completed: Active bedrest order   Fabio Asa 02/24/2019, 7:44 AM

## 2019-02-24 NOTE — Progress Notes (Signed)
OT Cancellation Note  Patient Details Name: Cody Porter MRN: [id number] DOB: [birth year]   Cancelled Treatment:    Reason Eval/Treat Not Completed: Active bedrest order.  Will reattempt.  Jeani Hawking, OTR/L Acute Rehabilitation Services Pager [phone] Office [phone]   Jeani Hawking M 02/24/2019, 10:41 AM

## 2019-02-24 NOTE — Progress Notes (Signed)
Patient ID: Cody Porter, male   DOB: 05/26/1961, 57 y.o.   MRN: [id number]   Request for IVC filter placement  01/28/19: + B DVT and + B PE Started on Eliquis  New fall at home Now with ICH Off anticoagulation for ever likely  Discussed with Dr Ainsley Spinner procedure  Pt has eaten today Will plan for filter placement 4/14 am in IR  We will consent pt in IR We will call for him 4/14 when can  RN aware

## 2019-02-25 ENCOUNTER — Inpatient Hospital Stay (HOSPITAL_COMMUNITY): Payer: Self-pay

## 2019-02-25 ENCOUNTER — Encounter (HOSPITAL_COMMUNITY): Payer: Self-pay | Admitting: Radiology

## 2019-02-25 DIAGNOSIS — I614 Nontraumatic intracerebral hemorrhage in cerebellum: Secondary | ICD-10-CM

## 2019-02-25 HISTORY — PX: IR IVC FILTER PLMT / S&I /IMG GUID/MOD SED: IMG701

## 2019-02-25 LAB — CBC
HCT: 50.4 % (ref 39.0–52.0)
Hemoglobin: 16.8 g/dL (ref 13.0–17.0)
MCH: 32.1 pg (ref 26.0–34.0)
MCHC: 33.3 g/dL (ref 30.0–36.0)
MCV: 96.2 fL (ref 80.0–100.0)
Platelets: 279 10*3/uL (ref 150–400)
RBC: 5.24 MIL/uL (ref 4.22–5.81)
RDW: 12.3 % (ref 11.5–15.5)
WBC: 8.2 10*3/uL (ref 4.0–10.5)
nRBC: 0 % (ref 0.0–0.2)

## 2019-02-25 LAB — BASIC METABOLIC PANEL
Anion gap: 12 (ref 5–15)
BUN: <5 mg/dL — ABNORMAL LOW (ref 6–20)
CO2: 23 mmol/L (ref 22–32)
Calcium: 9.5 mg/dL (ref 8.9–10.3)
Chloride: 103 mmol/L (ref 98–111)
Creatinine, Ser: 0.92 mg/dL (ref 0.61–1.24)
GFR calc Af Amer: >60 mL/min (ref >60)
GFR calc non Af Amer: >60 mL/min (ref >60)
Glucose, Bld: 101 mg/dL — ABNORMAL HIGH (ref 70–99)
Potassium: 3.3 mmol/L — ABNORMAL LOW (ref 3.5–5.1)
Sodium: 138 mmol/L (ref 135–145)

## 2019-02-25 LAB — LIPID PANEL
Cholesterol: 249 mg/dL — ABNORMAL HIGH (ref 0–200)
HDL: 47 mg/dL (ref >40)
LDL Cholesterol: 157 mg/dL — ABNORMAL HIGH (ref 0–99)
Total CHOL/HDL Ratio: 5.3 RATIO
Triglycerides: 225 mg/dL — ABNORMAL HIGH (ref <150)
VLDL: 45 mg/dL — ABNORMAL HIGH (ref 0–40)

## 2019-02-25 LAB — GLUCOSE, CAPILLARY: Glucose-Capillary: 129 mg/dL — ABNORMAL HIGH (ref 70–99)

## 2019-02-25 MED ORDER — LABETALOL HCL 5 MG/ML IV SOLN
20.0000 mg | Freq: Three times a day (TID) | INTRAVENOUS | Status: DC | PRN
  Administered 2019-02-25 – 2019-02-28 (×2): 20 mg via INTRAVENOUS
  Filled 2019-02-25 (×3): qty 4

## 2019-02-25 MED ORDER — FENTANYL CITRATE (PF) 100 MCG/2ML IJ SOLN
INTRAMUSCULAR | Status: AC
  Filled 2019-02-25: qty 2

## 2019-02-25 MED ORDER — MIDAZOLAM HCL 2 MG/2ML IJ SOLN
INTRAMUSCULAR | Status: AC
  Filled 2019-02-25: qty 2

## 2019-02-25 MED ORDER — MIDAZOLAM HCL 2 MG/2ML IJ SOLN
INTRAMUSCULAR | Status: AC | PRN
  Administered 2019-02-25: 1 mg via INTRAVENOUS

## 2019-02-25 MED ORDER — LIDOCAINE HCL 1 % IJ SOLN
INTRAMUSCULAR | Status: AC
  Filled 2019-02-25: qty 20

## 2019-02-25 MED ORDER — FENTANYL CITRATE (PF) 100 MCG/2ML IJ SOLN
INTRAMUSCULAR | Status: AC | PRN
  Administered 2019-02-25: 25 ug via INTRAVENOUS

## 2019-02-25 MED ORDER — HYDRALAZINE HCL 20 MG/ML IJ SOLN
20.0000 mg | Freq: Three times a day (TID) | INTRAMUSCULAR | Status: DC | PRN
  Administered 2019-02-25 – 2019-02-26 (×3): 20 mg via INTRAVENOUS
  Filled 2019-02-25 (×3): qty 1

## 2019-02-25 MED ORDER — IOHEXOL 300 MG/ML  SOLN
100.0000 mL | Freq: Once | INTRAMUSCULAR | Status: AC | PRN
  Administered 2019-02-25: 40 mL via INTRAVENOUS

## 2019-02-25 NOTE — Consult Note (Signed)
Chief Complaint: Patient was seen in consultation today for retrievable inferior vena cava filter placement Chief Complaint  Patient presents with   Emesis   Dizziness   at the request of Dr Vassie LollAlva   Supervising Physician: Richarda OverlieHenn, Adam  [facility]  History of Present Illness: Cody Porter is a 58 y.o. male   Hx ETOH abuse  Bilat DVT and Bilat PE 01/28/19 Started on [place] Fell at home 4/12 and hit head Came to ED with dizziness  CT 4/12: IMPRESSION: 1. Two foci of acute hemorrhage within the cerebellum, largest measuring 3 cm greatest dimension. Associated edema at both sites of hemorrhage causing local mass effect with sulcal effacement. No midline shift or evidence of herniation. Distribution suggest hypertensive hemorrhage, possibly complicated by recent commencement of Eliquis. 2. Small acute to subacute hemorrhage within the LEFT external capsule. 3. Mild chronic small vessel ischemic changes within the periventricular white matter.  Eliquis stopped Request for IVC filter placement Dr Lowella DandyHenn approves procedure  Past Medical History:  Diagnosis Date   ETOH abuse 01/28/2019   Leg DVT (deep venous thromboembolism), acute, bilateral (HCC) 01/28/2019   Pulmonary embolus (HCC) 01/28/2019    Past Surgical History:  Procedure Laterality Date   HERNIA REPAIR     TONSILLECTOMY      Allergies: Patient has no known allergies.  Medications: Prior to Admission medications   Medication Sig Start Date End Date Taking? Authorizing Provider  metoprolol tartrate (LOPRESSOR) 25 MG tablet Take 1 tablet (25 mg total) by mouth 2 (two) times daily for 30 days. 02/03/19 03/05/19 Yes Purohit, Salli QuarryShrey C, MD  apixaban (ELIQUIS) 5 MG TABS tablet Take 2 tablets (10 mg total) by mouth 2 (two) times daily for 13 days. 02/03/19 02/16/19  Purohit, Salli QuarryShrey C, MD     Family History  Family history unknown: Yes    Social History   Socioeconomic History   Marital  status: Single    Spouse name: Not on file   Number of children: Not on file   Years of education: Not on file   Highest education level: Not on file  Occupational History   Not on file  Social Needs   Financial resource strain: Not on file   Food insecurity:    Worry: Not on file    Inability: Not on file   Transportation needs:    Medical: Not on file    Non-medical: Not on file  Tobacco Use   Smoking status: Current Every Day Smoker    Packs/day: 0.75    Types: Cigarettes   Smokeless tobacco: Never Used  Substance and Sexual Activity   Alcohol use: Not Currently    Alcohol/week: 14.0 standard drinks    Types: 14 Cans of beer per week    Comment: Patient states no alcohol x 1 month   Drug use: Not Currently    Types: Marijuana   Sexual activity: Not on file  Lifestyle   Physical activity:    Days per week: Not on file    Minutes per session: Not on file   Stress: Not on file  Relationships   Social connections:    Talks on phone: Not on file    Gets together: Not on file    Attends religious service: Not on file    Active member of club or organization: Not on file    Attends meetings of clubs or organizations: Not on file    Relationship status: Not on file  Other Topics Concern   Not on file  Social History Narrative   Not on file    Review of Systems: A 12 point ROS discussed and pertinent positives are indicated in the HPI above.  All other systems are negative.  Review of Systems  Constitutional: Positive for activity change. Negative for fever.  Respiratory: Negative for cough and shortness of breath.   Cardiovascular: Negative for chest pain.  Neurological: Positive for weakness.  Psychiatric/Behavioral: Negative for behavioral problems and confusion.    Vital Signs: BP 130/82    Pulse 90    Temp 97.8 F (36.6 C) (Oral)    Resp 16    Ht  (1.727 m)    Wt 175 lb (79.4 kg)    SpO2 98%    BMI 26.61 kg/m   Physical Exam Vitals  signs reviewed.  Constitutional:      Appearance: Normal appearance.  Cardiovascular:     Rate and Rhythm: Normal rate and regular rhythm.     Heart sounds: Normal heart sounds.  Pulmonary:     Effort: Pulmonary effort is normal.     Breath sounds: Normal breath sounds.  Abdominal:     General: Bowel sounds are normal.  Musculoskeletal: Normal range of motion.  Skin:    General: Skin is warm and dry.  Neurological:     Mental Status: He is alert and oriented to person, place, and time.  Psychiatric:        Mood and Affect: Mood normal.        Behavior: Behavior normal.        Thought Content: Thought content normal.        Judgment: Judgment normal.     Comments: Although seems speech is slow Pt is oriented to name; date; DOB; place and reason for exam     Imaging: Ct Angio Head W Or Wo Contrast  Result Date: 02/23/2019 CLINICAL DATA:  58 y/o  M; cerebral hemorrhage. EXAM: CT ANGIOGRAPHY HEAD TECHNIQUE: Multidetector CT imaging of the head was performed using the standard protocol during bolus administration of intravenous contrast. Multiplanar CT image reconstructions and MIPs were obtained to evaluate the vascular anatomy. CONTRAST:  OMNIPAQUE IOHEXOL 350 MG/ML SOLN COMPARISON:  02/23/2019 CT head. FINDINGS: CTA HEAD Anterior circulation: No significant stenosis, proximal occlusion, aneurysm, or vascular malformation. Mild non stenotic calcific atherosclerosis of carotid siphons. Posterior circulation: No significant stenosis, proximal occlusion, aneurysm, or vascular malformation. Venous sinuses: As permitted by contrast timing, patent. Anatomic variants: Large left A1 and anterior communicating artery, small right A1, normal variant. Small right and probable diminutive left posterior communicating arteries. Delayed phase: No abnormal intracranial enhancement. IMPRESSION: 1. Patent anterior and posterior intracranial circulation. No large vessel occlusion, aneurysm, vascular  malformation, or significant stenosis. 2. Stable acute hematoma within the bilateral cerebellar hemispheres and subacute hemorrhage within the left external capsule. No active extravasation or enhancing lesion identified. Electronically Signed   By: Mitzi Hansen M.D.   On: 02/23/2019 20:30   Dg Chest 2 View  Result Date: 01/28/2019 CLINICAL DATA:  LEFT-sided chest pain for 5 days. EXAM: CHEST - 2 VIEW COMPARISON:  09/19/2008. FINDINGS: Suspected lingular infiltrate/atelectasis, with volume loss in the LEFT hemithorax. No effusion. No pneumothorax. RIGHT lung clear. Heart size normal. No osseous findings. IMPRESSION: Suspected lingular infiltrate/atelectasis, with volume loss in the LEFT hemithorax. Electronically Signed   By: Elsie Stain M.D.   On: 01/28/2019 12:33   Ct Head Wo Contrast  Result Date: 02/24/2019  CLINICAL DATA:  58 y/o  M; intracranial hemorrhage for follow-up EXAM: CT HEAD WITHOUT CONTRAST TECHNIQUE: Contiguous axial images were obtained from the base of the skull through the vertex without intravenous contrast. COMPARISON:  02/23/2019 CT head and CTA head. FINDINGS: Brain: Acute hemorrhage within right cerebellar hemisphere is stable measuring 31 x 24 mm in axial plane (series 3, image 8) and up to 44 mm in coronal plane (series 5, image 52). Acute hemorrhage in left cerebellar hemisphere is stable measuring 23 x 18 mm in axial plane (series 3, image 9). Stable subtle density in left external capsule compatible with subacute hemorrhage. Mass effect from the hematomas within the cerebellum well as associated vasogenic edema results in mild mass effect within the posterior fossa partially effacing the fourth ventricle. No associated hydrocephalus. No new hemorrhage, extra-axial collection, mass effect, stroke, or herniation is identified. Stable nonspecific white matter hypodensities compatible with chronic microvascular ischemic changes and stable volume loss of the brain.  Stable small chronic lacunar infarcts within the bilateral thalami and putamen. Vascular: Calcific atherosclerosis of the carotid siphons. No hyperdense vessel identified. Skull: Normal. Negative for fracture or focal lesion. Sinuses/Orbits: Mild mucosal thickening of the sphenoid sinus and ethmoid air cells. Additional visible paranasal sinuses and the mastoid air cells are normally aerated. Orbits are unremarkable. Other: None. IMPRESSION: Stable acute hemorrhage within the bilateral cerebellar hemispheres and subacute hemorrhage within the left external capsule. Stable associated vasogenic edema and mild local mass effect. No new acute intracranial abnormality identified. Electronically Signed   By: Mitzi Hansen M.D.   On: 02/24/2019 00:27   Ct Head Wo Contrast  Result Date: 02/23/2019 CLINICAL DATA:  Emesis and dizziness since this morning. EXAM: CT HEAD WITHOUT CONTRAST TECHNIQUE: Contiguous axial images were obtained from the base of the skull through the vertex without intravenous contrast. COMPARISON:  Head CT dated 01/31/2019. FINDINGS: Brain: 2 foci of acute hemorrhage within the cerebellum. The cerebellar hemorrhage to the RIGHT of midline measures 3.1 x 2.7 cm. The smaller cerebellar hemorrhage to the LEFT of midline measures 2.3 x 1.8 cm. Edema surrounds both sites of hemorrhage with associated local mass effect (sulcal effacement). No midline shift or herniation. Questionable small acute to subacute hemorrhage within the LEFT external capsule. No additional parenchymal hemorrhage. No extra-axial hemorrhage. No hydrocephalus. Mild chronic small vessel ischemic changes within the deep periventricular white matter regions bilaterally. Vascular: No hyperdense vessel or unexpected calcification. Skull: Normal. Negative for fracture or focal lesion. Sinuses/Orbits: No acute finding. Other: None. IMPRESSION: 1. Two foci of acute hemorrhage within the cerebellum, largest measuring 3 cm greatest  dimension. Associated edema at both sites of hemorrhage causing local mass effect with sulcal effacement. No midline shift or evidence of herniation. Distribution suggest hypertensive hemorrhage, possibly complicated by recent commencement of Eliquis. 2. Small acute to subacute hemorrhage within the LEFT external capsule. 3. Mild chronic small vessel ischemic changes within the periventricular white matter. Critical Value/emergent results were called by telephone at the time of interpretation on 02/23/2019 at 6:20 pm to Dr. Leonia Corona , who verbally acknowledged these results. Electronically Signed   By: Bary Richard M.D.   On: 02/23/2019 18:23   Ct Head Wo Contrast  Result Date: 01/31/2019 CLINICAL DATA:  Acute metabolic encephalopathy. EXAM: CT HEAD WITHOUT CONTRAST TECHNIQUE: Contiguous axial images were obtained from the base of the skull through the vertex without intravenous contrast. COMPARISON:  09/26/2008 FINDINGS: Brain: No evidence of acute infarction, hemorrhage, hydrocephalus, extra-axial collection or mass lesion/mass  effect. Lacunar infarct in the bilateral thalamus that are discrete and chronic appearing on coronal reformats. Vascular: No hyperdense vessel or unexpected calcification. Skull: Normal. Negative for fracture or focal lesion. Sinuses/Orbits: No acute finding. IMPRESSION: 1. No acute finding. 2. 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 Electronically Signed   By: Marnee Spring M.D.   On: 01/31/2019 09:24   Ct Angio Chest Pe W/cm &/or Wo Cm  Result Date: 01/28/2019 CLINICAL DATA:  Chest pain EXAM: CT ANGIOGRAPHY CHEST WITH CONTRAST TECHNIQUE: Multidetector CT imaging of the chest was performed using the standard protocol during bolus administration of intravenous contrast. Multiplanar CT image reconstructions and MIPs were obtained to evaluate the vascular anatomy. CONTRAST:  74mL ISOVUE-370 COMPARISON:  Chest film from earlier in the same FINDINGS: Cardiovascular:  Atherosclerotic calcifications of the thoracic aorta are noted without aneurysmal dilatation or dissection. No cardiac enlargement is noted. There are changes suggestive of mild right heart strain within RV/LV ratio of 1. The pulmonary artery demonstrates bilateral pulmonary emboli throughout the entire arterial system. Mediastinum/Nodes: The esophagus shows evidence of a small sliding-type hiatal hernia. The thoracic inlet is within normal limits. No sizable hilar or mediastinal adenopathy is identified. Lungs/Pleura: Mild infiltrate is noted within the left lingula. This corresponds to the area of abnormality on recent chest x-ray and likely in part related to the known pulmonary embolism. No sizable effusion is seen. Mild emphysematous changes are noted. No sizable parenchymal nodules are seen. Upper Abdomen: Visualized upper abdomen is within normal limits. Musculoskeletal: Degenerative changes of the thoracic spine are seen. No compression deformities are noted. Review of the MIP images confirms the above findings. IMPRESSION: Positive for acute PE with CT evidence of right heart strain (RV/LV Ratio = 1) consistent with at least submassive (intermediate risk) PE. The presence of right heart strain has been associated with an increased risk of morbidity and mortality. Please activate Code PE by paging [phone]. Mild lingular infiltrate which is likely related to the underlying pulmonary embolism. Aortic Atherosclerosis (ICD10-I70.0) and Emphysema (ICD10-J43.9). Critical Value/emergent results were called by telephone at the time of interpretation on 01/28/2019 at 3:10 pm to [facility] , who verbally acknowledged these results. Electronically Signed   By: Alcide Clever M.D.   On: 01/28/2019 15:13   Mr Laqueta Jean WG Contrast  Result Date: 02/24/2019 CLINICAL DATA:  Intracranial hemorrhage follow up EXAM: MRI HEAD WITHOUT AND WITH CONTRAST TECHNIQUE: Multiplanar, multiecho pulse sequences of the brain and  surrounding structures were obtained without and with intravenous contrast. CONTRAST:  7.5 mL Gadavist COMPARISON:  Head CT 02/24/2019 FINDINGS: BRAIN: There are magnetic susceptibility effects corresponding to known hemorrhage within both cerebellar hemispheres and within the left posterior basal ganglia/external capsule. There is chronic microhemorrhage evident in the right thalamus. This obscures diffusion analysis. The midline structures are normal. Mass effect in the posterior fossa is unchanged. No midline shift or herniation. The size of the cerebellar hematomas is unchanged. Multifocal white matter hyperintensity, most commonly due to chronic ischemic microangiopathy. Advanced atrophy for age. No abnormal contrast enhancement. VASCULAR: The major intracranial arterial and venous sinus flow voids are normal. SKULL AND UPPER CERVICAL SPINE: Calvarial bone marrow signal is normal. There is no skull base mass. Visualized upper cervical spine and soft tissues are normal. SINUSES/ORBITS: No fluid levels or advanced mucosal thickening. No mastoid or middle ear effusion. The orbits are normal. IMPRESSION: 1. Unchanged size of intraparenchymal hematomas within both cerebellar hemispheres, right greater than left. 2. Unchanged size of  small intraparenchymal hematoma at the junction of the posterior left basal ganglia and external capsule. 3. Mild mass effect on the posterior fossa without hydrocephalus. No supratentorial midline shift or herniation. 4. Mild chronic small vessel disease and age advanced atrophy. Electronically Signed   By: Deatra Robinson M.D.   On: 02/24/2019 13:59   Vas [place] Lower Extremity Venous (dvt) (only [facility])  Result Date: 01/29/2019  Lower Venous Study Indications: Edema, SOB, and pulmonary embolism.  Risk Factors: Confirmed PE. Performing Technologist: Toma Deiters RVS  Examination Guidelines: A complete evaluation includes B-mode imaging, spectral Doppler, color Doppler, and power  Doppler as needed of all accessible portions of each vessel. Bilateral testing is considered an integral part of a complete examination. Limited examinations for reoccurring indications may be performed as noted.  Right Venous Findings: +---------+---------------+---------+-----------+----------+--------------+            Compressibility Phasicity Spontaneity Properties Summary         +---------+---------------+---------+-----------+----------+--------------+  CFV       Full            Yes                                              +---------+---------------+---------+-----------+----------+--------------+  SFJ       Full                                                             +---------+---------------+---------+-----------+----------+--------------+  FV Prox   Full            Yes       Yes                                    +---------+---------------+---------+-----------+----------+--------------+  FV Mid    None            No        No                                     +---------+---------------+---------+-----------+----------+--------------+  FV Distal None            No        No                                     +---------+---------------+---------+-----------+----------+--------------+  PFV       Full            Yes       Yes                                    +---------+---------------+---------+-----------+----------+--------------+  POP       None                                                             +---------+---------------+---------+-----------+----------+--------------+  PTV       Partial                                          distal region   +---------+---------------+---------+-----------+----------+--------------+  PERO                                                       Not visualized  +---------+---------------+---------+-----------+----------+--------------+  SSV       Partial                                                           +---------+---------------+---------+-----------+----------+--------------+ Unable to image the iliac veins  Left Venous Findings: +---------+---------------+---------+-----------+----------+-------------------+            Compressibility Phasicity Spontaneity Properties Summary              +---------+---------------+---------+-----------+----------+-------------------+  CFV       Partial         Yes       Yes                    mobile in the mid                                                                region at the sfj    +---------+---------------+---------+-----------+----------+-------------------+  SFJ       Partial                                          mobile               +---------+---------------+---------+-----------+----------+-------------------+  FV Prox   None                                                                  +---------+---------------+---------+-----------+----------+-------------------+  FV Mid    None                                                                  +---------+---------------+---------+-----------+----------+-------------------+  FV Distal None                                                                  +---------+---------------+---------+-----------+----------+-------------------+  PFV       Full            Yes       Yes                                         +---------+---------------+---------+-----------+----------+-------------------+  POP       Partial                                          minimal flow         +---------+---------------+---------+-----------+----------+-------------------+  PTV       Full                                                                  +---------+---------------+---------+-----------+----------+-------------------+  PERO                                                       not visualized due                                                               to edema              +---------+---------------+---------+-----------+----------+-------------------+ Unable to visualize the iliac veins    Summary: Right: Findings consistent with acute deep vein thrombosis involving the right femoral vein, right popliteal vein, right posterior tibial vein, and right gastrocnemius vein. See summaru comnets listed above Left: Findings consistent with acute deep vein thrombosis involving the left common femoral vein, left femoral vein, and left popliteal vein. See summary comments listed above  *See table(s) above for measurements and observations. Electronically signed by Gretta Began MD on 01/29/2019 at 5:02:17 PM.    Final     Labs:  CBC: Recent Labs    02/02/19 0330 02/03/19 0514 02/23/19 1639 02/25/19 0403  WBC 7.7 7.5 7.3 8.2  HGB 14.8 14.2 15.3 16.8  HCT 47.6 44.2 47.3 50.4  PLT 399 401* 286 279    COAGS: Recent Labs    01/28/19 1210 02/23/19 1818  INR 0.9 1.0  APTT 38* 37*    BMP: Recent Labs    01/31/19 0157 02/01/19 0253 02/23/19 1639  02/24/19 1050 02/24/19 1603 02/24/19 2154 02/25/19 0403  NA 133* 136 136   < > 141 142 138 138  K 3.9 3.9 4.0  --   --   --   --  3.3*  CL 103 107 103  --   --   --   --  103  CO2 20* 20* 24  --   --   --   --  23  GLUCOSE 107* 102* 148*  --   --   --   --  101*  BUN 15 11 7   --   --   --   --  <5*  CALCIUM 8.3* 8.6* 9.2  --   --   --   --  9.5  CREATININE 1.00 0.83 0.94  --   --   --   --  0.92  GFRNONAA >60 >60 >60  --   --   --   --  >60  GFRAA >60 >60 >60  --   --   --   --  >60   < > = values in this interval not displayed.    LIVER FUNCTION TESTS: Recent Labs    01/29/19 0217 02/01/19 0253 02/23/19 1639  BILITOT 1.2 0.7 0.5  AST 19 17 15   ALT 14 15 14   ALKPHOS 61 52 57  PROT 7.2 6.3* 7.5  ALBUMIN 3.2* 2.6* 3.8    TUMOR MARKERS: No results for input(s): AFPTM, CEA, CA199, CHROMGRNA in the last 8760 hours.  Assessment and Plan:  B DVT; B PE-- Eliquis  Fall at home-- dizziness CT shows  ICH eliquis stopped Now for retrievable Inferior vena cava filter placement  Risks and benefits discussed with the patient including, but not limited to bleeding, infection, contrast induced renal failure, filter fracture or migration which can lead to emergency surgery or even death, strut penetration with damage or irritation to adjacent structures and caval thrombosis.  All of the patient's questions were answered, patient is agreeable to proceed. Consent signed and in chart.   Thank you for this interesting consult.  I greatly enjoyed meeting Cody Porter and look forward to participating in their care.  A copy of this report was sent to the requesting provider on this date.  Electronically Signed: Robet Leu, PA-C 02/25/2019, 10:46 AM   I spent a total of 20 Minutes    in face to face in clinical consultation, greater than 50% of which was counseling/coordinating care for Retrievable IVC filter

## 2019-02-25 NOTE — Evaluation (Signed)
Physical Therapy Evaluation Patient Details Name: Cody Porter MRN: [id number] DOB: 11/20/1960 Today's Date: 02/25/2019   History of Present Illness  58 y.o. male with past medical history of PE diagnosed in March, DVT, paroxysmal atrial fibrillation, alcohol abuse, who presented to the emergency room for evaluation of dizziness. CT of the head revealed acute hemorrhage within the bilateral cerebellar hemispheres and subacute hemorrhage within the left external capsule. MRI showed unchanged size of intraparenchymal hematomas within both cerebellar hemispheres, right greater than left.   Clinical Impression  Orders received for PT evaluation. Patient demonstrates deficits in functional mobility as indicated below. Will benefit from continued skilled PT to address deficits and maximize function. Will see as indicated and progress as tolerated.  At this time, patient cognition and safety deficits remain highest concern. Will follow for progression, but at this time, feel patient is a high fall risk, combined with cognitive concerns, feel patient would be best suited for st SNF upon acute discharge.     Follow Up Recommendations SNF;Supervision/Assistance - 24 hour    Equipment Recommendations  None recommended by PT    Recommendations for Other Services       Precautions / Restrictions Precautions Precautions: Fall      Mobility  Bed Mobility Overal bed mobility: Needs Assistance Bed Mobility: Supine to Sit     Supine to sit: Min assist     General bed mobility comments: Min assist to come to EOB, increased time for performance, poor postural stability initially at EOb  Transfers Overall transfer level: Needs assistance Equipment used: 1 person hand held assist Transfers: Sit to/from Stand Sit to Stand: Min assist         General transfer comment: min assist for safety and stability  Ambulation/Gait Ambulation/Gait assistance: Min assist Gait Distance (Feet): 18  Feet Assistive device: 1 person hand held assist Gait Pattern/deviations: Step-through pattern Gait velocity: decreased Gait velocity interpretation: <1.8 ft/sec, indicate of risk for recurrent falls General Gait Details: noted instability and poor environmental awareness  Stairs            Wheelchair Mobility    Modified Rankin (Stroke Patients Only) Modified Rankin (Stroke Patients Only) Pre-Morbid Rankin Score: No symptoms Modified Rankin: Moderately severe disability     Balance Overall balance assessment: Needs assistance;History of Falls Sitting-balance support: Feet supported Sitting balance-Leahy Scale: Fair     Standing balance support: During functional activity Standing balance-Leahy Scale: Poor Standing balance comment: some intability in standing during task performance                             Pertinent Vitals/Pain Pain Assessment: No/denies pain    Home Living Family/patient expects to be discharged to:: Private residence Living Arrangements: Parent Available Help at Discharge: Available PRN/intermittently Type of Home: House       Home Layout: Two level;Bed/bath upstairs Home Equipment: None      Prior Function Level of Independence: Independent               Hand Dominance   Dominant Hand: Right    Extremity/Trunk Assessment   Upper Extremity Assessment Upper Extremity Assessment: Generalized weakness    Lower Extremity Assessment Lower Extremity Assessment: Generalized weakness(decreased bilateral coordination and tremor)       Communication   Communication: No difficulties  Cognition Arousal/Alertness: Awake/alert Behavior During Therapy: WFL for tasks assessed/performed Overall Cognitive Status: Impaired/Different from baseline Area of Impairment: Following commands;Awareness;Problem solving;Safety/judgement;Attention  Current Attention Level: Sustained   Following Commands:  Follows one step commands with increased time Safety/Judgement: Decreased awareness of safety;Decreased awareness of deficits Awareness: Emergent Problem Solving: Slow processing;Decreased initiation;Difficulty sequencing;Requires verbal cues;Requires tactile cues General Comments: Patient oriented during conversation but during functional task performance patient easily distracted from task and with difficulty processing through activities such as bathing. Patient began to was himself over top of his gown, required max multi modal cues and evenutal physical assist to accomplish task performance      General Comments      Exercises     Assessment/Plan    PT Assessment Patient needs continued PT services  PT Problem List Decreased strength;Decreased activity tolerance;Decreased knowledge of use of DME;Decreased safety awareness;Decreased knowledge of precautions;Decreased mobility;Cardiopulmonary status limiting activity       PT Treatment Interventions DME instruction;Gait training;Stair training;Functional mobility training;Therapeutic activities;Patient/family education    PT Goals (Current goals can be found in the Care Plan section)  Acute Rehab PT Goals Patient Stated Goal: to go home PT Goal Formulation: With patient Time For Goal Achievement: 03/11/19 Potential to Achieve Goals: Good    Frequency Min 3X/week   Barriers to discharge Decreased caregiver support      Co-evaluation PT/OT/SLP Co-Evaluation/Treatment: Yes Reason for Co-Treatment: For patient/therapist safety;Necessary to address cognition/behavior during functional activity;To address functional/ADL transfers PT goals addressed during session: Mobility/safety with mobility;Balance OT goals addressed during session: ADL's and self-care       AM-PAC PT "6 Clicks" Mobility  Outcome Measure Help needed turning from your back to your side while in a flat bed without using bedrails?: A Little Help needed  moving from lying on your back to sitting on the side of a flat bed without using bedrails?: A Little Help needed moving to and from a bed to a chair (including a wheelchair)?: A Little Help needed standing up from a chair using your arms (e.g., wheelchair or bedside chair)?: A Little Help needed to walk in hospital room?: A Little Help needed climbing 3-5 steps with a railing? : A Lot 6 Click Score: 17    End of Session Equipment Utilized During Treatment: Gait belt Activity Tolerance: Treatment limited secondary to medical complications (Comment)(patient with noted elvations in HR 150s with task performanc) Patient left: in chair;with call bell/phone within reach;with chair alarm set Nurse Communication: Mobility status PT Visit Diagnosis: Unsteadiness on feet (R26.81)    Time: 3833-3832 PT Time Calculation (min) (ACUTE ONLY): 24 min   Charges:   PT Evaluation $PT Eval Moderate Complexity: 1 Mod          Charlotte Crumb, PT DPT  Board Certified Neurologic Specialist Acute Rehabilitation Services Pager [phone] Office [phone]   Fabio Asa 02/25/2019, 4:08 PM

## 2019-02-25 NOTE — Progress Notes (Signed)
OT Cancellation Note  Patient Details Name: Cody Porter MRN: [id number] DOB: 10/13/1961   Cancelled Treatment:    Reason Eval/Treat Not Completed: Patient not medically ready.  Pt awaiting placement of IVC filter.  Jeani Hawking, OTR/L Acute Rehabilitation Services Pager [phone] Office [phone]   Jeani Hawking M 02/25/2019, 9:05 AM

## 2019-02-25 NOTE — Evaluation (Signed)
Speech Language Pathology Evaluation Patient Details Name: Cody Porter MRN: [id number] DOB: [birth year] Today's Date: 02/25/2019 Time: [id number] SLP Time Calculation (min) (ACUTE ONLY): 25 min  Problem List:  Patient Active Problem List   Diagnosis Date Noted  . ICH (intracerebral hemorrhage) (HCC) 02/23/2019  . ETOH abuse 01/28/2019  . Pulmonary embolus (HCC) 01/28/2019  . Leg DVT (deep venous thromboembolism), acute, bilateral (HCC) 01/28/2019  . Acute pulmonary embolism (HCC) 01/28/2019   Past Medical History:  Past Medical History:  Diagnosis Date  . ETOH abuse 01/28/2019  . Leg DVT (deep venous thromboembolism), acute, bilateral (HCC) 01/28/2019  . Pulmonary embolus (HCC) 01/28/2019   Past Surgical History:  Past Surgical History:  Procedure Laterality Date  . HERNIA REPAIR    . TONSILLECTOMY     HPI:  Pt is a 58 y.o. male with past medical history of PE diagnosed in March, DVT, paroxysmal atrial fibrillation, alcohol abuse, who presented to the emergency room for evaluation of dizziness. CT of the head revealed acute hemorrhage within the bilateral cerebellar hemispheres and subacute hemorrhage within the left external capsule. MRI showed unchanged size of intraparenchymal hematomas within both cerebellar hemispheres, right greater than left.   Assessment / Plan / Recommendation Clinical Impression  Pt denied any baseline or new speech/language/cognitive deficits. His mother was contacted via phone to corroborate the pt's report. She also denied the pt having any baseline  deficts but she stated that the pt likes to have her do tasks such as medication management and she has to encourage him to be as independent as possible.[facility] to evaluate the pt's cognitive-linguistic skills. He achieved a score of 12/30 which is below the normal limits of 26 or more out of 30 and is suggestive of a moderate impairment. He demonstrated deficits in  the areas of attention, executive function, mental manipulation, divergent naming, abstract reasoning, and delayed recall.  Based on this SLP's description of the pt's difficulty, she stated that the current cognitive-linguistic deficits are likely new. Skilled SLP services are clinically indicated at this time to improve cognition. Pt, his mother, and nursing were educated regarding results and recommendations; all parties verbalized understanding as well as agreement with plan of care.    SLP Assessment  SLP Recommendation/Assessment: Patient needs continued Speech Lanaguage Pathology Services SLP Visit Diagnosis: Cognitive communication deficit (R41.841)    Follow Up Recommendations  (Continued SLP services)    Frequency and Duration min 2x/week  2 weeks      SLP Evaluation Cognition  Overall Cognitive Status: No family/caregiver present to determine baseline cognitive functioning Arousal/Alertness: Awake/alert Orientation Level: Oriented to person;Oriented to place;Oriented to situation;Disoriented to time(Disoriented to date) Attention: Focused;Sustained Focused Attention: Impaired Focused Attention Impairment: Verbal complex(Vigilance impaired: 0/1) Sustained Attention: Impaired Sustained Attention Impairment: Verbal complex(Serial 7s: 0/3) Memory: Impaired Memory Impairment: Retrieval deficit;Decreased short term memory Decreased Short Term Memory: Verbal complex(Immediate memory: 5/5; Delayed: 5/5) Awareness: Impaired Awareness Impairment: Intellectual impairment Executive Function: Reasoning;Sequencing Reasoning: Impaired Reasoning Impairment: Verbal complex(Asbtraction: 1/2) Sequencing: Impaired Sequencing Impairment: Verbal complex(Difficulty drawing Porter: 1/3)       Comprehension  Auditory Comprehension Overall Auditory Comprehension: Appears within functional limits for tasks assessed Yes/No Questions: Within Functional Limits Commands: Impaired Complex Commands:  (Trail completion: 0/1) Conversation: Simple Reading Comprehension Reading Status: Not tested    Expression Expression Primary Mode of Expression: Verbal Verbal Expression Overall Verbal Expression: Appears within functional limits for tasks assessed Initiation: No impairment Level of Generative/Spontaneous Verbalization: Sentence Repetition:  Impaired Level of Impairment: Sentence level(1/2) Naming: Impairment Responsive: Other (comment) Confrontation: Impaired(2/3) Convergent: (Sentence completion: 4/4) Divergent: (0/1) Pragmatics: No impairment Written Expression Dominant Hand: Right Written Expression: (Copying cube: 1/1)   Oral / Motor  Oral Motor/Sensory Function Overall Oral Motor/Sensory Function: Within functional limits Motor Speech Overall Motor Speech: Appears within functional limits for tasks assessed Respiration: Within functional limits Phonation: Normal Resonance: Within functional limits Articulation: Within functional limitis Intelligibility: Intelligible Motor Planning: Witnin functional limits Motor Speech Errors: Not applicable   Cody Porter I. Vear Clock, MS, CCC-SLP Acute Rehabilitation Services Office number [phone] Pager [phone]                    Cody Porter 02/25/2019, 10:23 AM

## 2019-02-25 NOTE — Procedures (Signed)
Interventional Radiology Procedure:   Indications: Intracranial hemorrhage and history of pulmonary embolism  Procedure: IVC filter placement  Findings: Patent IVC and [place] filter placed below renal veins  Complications:  None     EBL: Minimal, less than 10 ml  Plan: Bedrest 2 hours.     Antoine Vandermeulen R. Lowella Dandy, MD  Pager: [phone]

## 2019-02-25 NOTE — Evaluation (Signed)
Occupational Therapy Evaluation Patient Details Name: Cody Porter MRN: [id number] DOB: 03-01-1961 Today's Date: 02/25/2019    History of Present Illness 58 y.o. male with past medical history of PE diagnosed in March, DVT, paroxysmal atrial fibrillation, alcohol abuse, who presented to the emergency room for evaluation of dizziness. CT of the head revealed acute hemorrhage within the bilateral cerebellar hemispheres and subacute hemorrhage within the left external capsule. MRI showed unchanged size of intraparenchymal hematomas within both cerebellar hemispheres, right greater than left.    Clinical Impression   Pt admitted with above. He demonstrates the below listed deficits and will benefit from continued OT to maximize safety and independence with BADLs.  Pt presents to OT with significant cognitive impairment including deficits with problem solving, sequencing, and attention.  He required max cues and physical assist to complete bathing due to inability to problem solve and sequence through the activity.  He lived with his mother PTA, and was mod I with ADLs.  Anticipate he will require SNF level rehab at discharge.       Follow Up Recommendations  SNF    Equipment Recommendations  3 in 1 bedside commode;Tub/shower bench    Recommendations for Other Services       Precautions / Restrictions Precautions Precautions: Fall      Mobility Bed Mobility Overal bed mobility: Needs Assistance Bed Mobility: Supine to Sit     Supine to sit: Min assist     General bed mobility comments: Min assist to come to EOB, increased time for performance, poor postural stability initially at EOb  Transfers Overall transfer level: Needs assistance Equipment used: 1 person hand held assist Transfers: Sit to/from Stand Sit to Stand: Min assist         General transfer comment: min assist for safety and stability    Balance Overall balance assessment: Needs assistance;History of  Falls Sitting-balance support: Feet supported Sitting balance-Leahy Scale: Fair     Standing balance support: During functional activity Standing balance-Leahy Scale: Poor Standing balance comment: some intability in standing during task performance                           ADL either performed or assessed with clinical judgement   ADL Overall ADL's : Needs assistance/impaired Eating/Feeding: Set up;Sitting   Grooming: Wash/dry hands;Wash/dry face;Brushing hair;Standing;Moderate assistance Grooming Details (indicate cue type and reason): verbal cues  Upper Body Bathing: Maximal assistance;Sitting   Lower Body Bathing: Maximal assistance;Sit to/from stand   Upper Body Dressing : Total assistance;Sitting   Lower Body Dressing: Moderate assistance;Sit to/from stand Lower Body Dressing Details (indicate cue type and reason): increased effort to access feet, and assist for balance  Toilet Transfer: Minimal assistance;+2 for physical assistance;+2 for safety/equipment;Ambulation;Comfort height toilet;Grab bars   Toileting- Clothing Manipulation and Hygiene: Moderate assistance;Sit to/from stand       Functional mobility during ADLs: Minimal assistance;+2 for physical assistance;+2 for safety/equipment General ADL Comments: Pt required max cues to problem solve and sequence how to spnge bathe.  He initially picke up a folded bath towel, wet it with cold water, then rubbed the folded edge lightly over his chest and indicated he was done.  He reuquired max cues to use appropropriate wash cloths, to apply soap, and to wash thoroughly.  He was highly distracted by his lines      Vision Patient Visual Report: No change from baseline Vision Assessment?: Yes Tracking/Visual Pursuits: Decreased smoothness of horizontal tracking;Decreased  smoothness of vertical tracking Additional Comments: Pt with slow beating horizontal nystagmus to the Rt      Perception Perception Perception  Tested?: Yes   Praxis Praxis Praxis tested?: Deficits Deficits: Initiation;Organization    Pertinent Vitals/Pain Pain Assessment: No/denies pain     Hand Dominance Right   Extremity/Trunk Assessment Upper Extremity Assessment Upper Extremity Assessment: Generalized weakness;LUE deficits/detail;RUE deficits/detail RUE Deficits / Details: bil. UEs tremulous  RUE Coordination: decreased fine motor;decreased gross motor LUE Deficits / Details: bil. UEs tremulous  LUE Coordination: decreased fine motor;decreased gross motor   Lower Extremity Assessment Lower Extremity Assessment: Generalized weakness   Cervical / Trunk Assessment Cervical / Trunk Assessment: Normal   Communication Communication Communication: No difficulties   Cognition Arousal/Alertness: Awake/alert Behavior During Therapy: WFL for tasks assessed/performed Overall Cognitive Status: Impaired/Different from baseline Area of Impairment: Following commands;Awareness;Problem solving;Safety/judgement;Attention                   Current Attention Level: Sustained   Following Commands: Follows one step commands with increased time Safety/Judgement: Decreased awareness of safety;Decreased awareness of deficits Awareness: Intellectual Problem Solving: Slow processing;Decreased initiation;Difficulty sequencing;Requires verbal cues;Requires tactile cues General Comments: Patient oriented during conversation but during functional task performance patient easily distracted from task and with difficulty processing through activities such as bathing. Patient began to was himself over top of his gown, required max multi modal cues and evenutal physical assist to accomplish task performance   General Comments  HR to 140 with bathing at sink in seated position.  DOE 3/4 - RN aware     Exercises     Shoulder Instructions      Home Living Family/patient expects to be discharged to:: Private residence Living  Arrangements: Parent Available Help at Discharge: Available PRN/intermittently Type of Home: House       Home Layout: Two level;Bed/bath upstairs Alternate Level Stairs-Number of Steps: reports mother sleeps on first floor usuually, Alternate Level Stairs-Rails: Right;Left           Home Equipment: None      Lives With: Other (Comment)(mother)    Prior Functioning/Environment Level of Independence: Independent                 OT Problem List: Decreased strength;Decreased activity tolerance;Impaired balance (sitting and/or standing);Impaired vision/perception;Decreased coordination;Decreased cognition;Decreased safety awareness;Decreased knowledge of use of DME or AE;Impaired UE functional use      OT Treatment/Interventions: Self-care/ADL training;Neuromuscular education;DME and/or AE instruction;Cognitive remediation/compensation;Therapeutic activities;Visual/perceptual remediation/compensation;Patient/family education;Balance training    OT Goals(Current goals can be found in the care plan section) Acute Rehab OT Goals Patient Stated Goal: Pt states he wants to go home  OT Goal Formulation: With patient Time For Goal Achievement: 03/11/19 Potential to Achieve Goals: Good ADL Goals Pt Will Perform Grooming: with min assist;standing Pt Will Perform Upper Body Bathing: with min assist;sitting Pt Will Perform Lower Body Bathing: with min assist;sit to/from stand Pt Will Perform Upper Body Dressing: with min assist;sitting Pt Will Perform Lower Body Dressing: with min assist;sit to/from stand Pt Will Transfer to Toilet: with min assist;ambulating;bedside commode Pt Will Perform Toileting - Clothing Manipulation and hygiene: with min assist;sit to/from stand Additional ADL Goal #1: Pt will require no more than min cues to sequence familiar ADL tasks  OT Frequency: Min 2X/week   Barriers to D/C: Decreased caregiver support  Pt lives with elderly mother         Co-evaluation PT/OT/SLP Co-Evaluation/Treatment: Yes Reason for Co-Treatment: Complexity of the patient's impairments (multi-system  involvement);For patient/therapist safety;Necessary to address cognition/behavior during functional activity;To address functional/ADL transfers   OT goals addressed during session: ADL's and self-care      AM-PAC OT "6 Clicks" Daily Activity     Outcome Measure Help from another person eating meals?: A Little Help from another person taking care of personal grooming?: A Lot Help from another person toileting, which includes using toliet, bedpan, or urinal?: A Lot Help from another person bathing (including washing, rinsing, drying)?: A Lot Help from another person to put on and taking off regular upper body clothing?: A Lot Help from another person to put on and taking off regular lower body clothing?: A Lot 6 Click Score: 13   End of Session Equipment Utilized During Treatment: Gait belt Nurse Communication: Mobility status  Activity Tolerance: Patient limited by fatigue Patient left: in chair;with call bell/phone within reach;with chair alarm set;with nursing/sitter in room  OT Visit Diagnosis: Unsteadiness on feet (R26.81);Cognitive communication deficit (R41.841) Symptoms and signs involving cognitive functions: Other Nontraumatic ICH                Time: 9977-4142 OT Time Calculation (min): 30 min Charges:  OT General Charges $OT Visit: 1 Visit OT Evaluation $OT Eval Moderate Complexity: 1 Mod  Jeani Hawking, OTR/L Acute Rehabilitation Services Pager [phone] Office [phone]   Jeani Hawking M 02/25/2019, 6:23 PM

## 2019-02-25 NOTE — Progress Notes (Signed)
Dr. Sethi changed SBP goal to <160. 

## 2019-02-25 NOTE — Progress Notes (Signed)
STROKE TEAM PROGRESS NOTE   INTERVAL HISTORY Patient is doing well.  He is neurologically stable.  Blood pressure is well controlled.  He is going for IVC filter placement today.  TCD bubble study is planned for later today as well.  He has no complaints. MRI scan shows stable appearance of hemorrhagic infarcts in bilateral cerebellum and left basal ganglia Vitals:   02/25/19 0745 02/25/19 0800 02/25/19 0845 02/25/19 0900  BP: 135/77 (!) 147/91 137/88 136/90  Pulse: 65 90 87 94  Resp: 14 (!) 25 15 17   Temp:  97.8 F (36.6 C)    TempSrc:  Oral    SpO2: 98% 97% 96% 97%  Weight:      Height:        CBC:  Recent Labs  Lab 02/23/19 1639 02/25/19 0403  WBC 7.3 8.2  NEUTROABS 5.6  --   HGB 15.3 16.8  HCT 47.3 50.4  MCV 98.3 96.2  PLT 286 279    Basic Metabolic Panel:  Recent Labs  Lab 02/23/19 1639  02/24/19 2154 02/25/19 0403  NA 136   < > 138 138  K 4.0  --   --  3.3*  CL 103  --   --  103  CO2 24  --   --  23  GLUCOSE 148*  --   --  101*  BUN 7  --   --  <5*  CREATININE 0.94  --   --  0.92  CALCIUM 9.2  --   --  9.5   < > = values in this interval not displayed.   Lipid Panel:     Component Value Date/Time   CHOL 249 (H) 02/25/2019 0403   TRIG 225 (H) 02/25/2019 0403   HDL 47 02/25/2019 0403   CHOLHDL 5.3 02/25/2019 0403   VLDL 45 (H) 02/25/2019 0403   LDLCALC 157 (H) 02/25/2019 0403   HgbA1c: No results found for: HGBA1C Urine Drug Screen:     Component Value Date/Time   LABOPIA NONE DETECTED 02/23/2019 1957   COCAINSCRNUR NONE DETECTED 02/23/2019 1957   LABBENZ NONE DETECTED 02/23/2019 1957   AMPHETMU NONE DETECTED 02/23/2019 1957   THCU NONE DETECTED 02/23/2019 1957   LABBARB NONE DETECTED 02/23/2019 1957    Alcohol Level     Component Value Date/Time   ETH <10 01/28/2019 1554    IMAGING Ct Angio Head W Or Wo Contrast  Result Date: 02/23/2019 CLINICAL DATA:  58 y/o  M; cerebral hemorrhage. EXAM: CT ANGIOGRAPHY HEAD TECHNIQUE: Multidetector  CT imaging of the head was performed using the standard protocol during bolus administration of intravenous contrast. Multiplanar CT image reconstructions and MIPs were obtained to evaluate the vascular anatomy. CONTRAST:  100mL OMNIPAQUE IOHEXOL 350 MG/ML SOLN COMPARISON:  02/23/2019 CT head. FINDINGS: CTA HEAD Anterior circulation: No significant stenosis, proximal occlusion, aneurysm, or vascular malformation. Mild non stenotic calcific atherosclerosis of carotid siphons. Posterior circulation: No significant stenosis, proximal occlusion, aneurysm, or vascular malformation. Venous sinuses: As permitted by contrast timing, patent. Anatomic variants: Large left A1 and anterior communicating artery, small right A1, normal variant. Small right and probable diminutive left posterior communicating arteries. Delayed phase: No abnormal intracranial enhancement. IMPRESSION: 1. Patent anterior and posterior intracranial circulation. No large vessel occlusion, aneurysm, vascular malformation, or significant stenosis. 2. Stable acute hematoma within the bilateral cerebellar hemispheres and subacute hemorrhage within the left external capsule. No active extravasation or enhancing lesion identified. Electronically Signed   By: Buzzy HanLance  Furusawa-Stratton M.D.  On: 02/23/2019 20:30   Ct Head Wo Contrast  Result Date: 02/24/2019 CLINICAL DATA:  58 y/o  M; intracranial hemorrhage for follow-up EXAM: CT HEAD WITHOUT CONTRAST TECHNIQUE: Contiguous axial images were obtained from the base of the skull through the vertex without intravenous contrast. COMPARISON:  02/23/2019 CT head and CTA head. FINDINGS: Brain: Acute hemorrhage within right cerebellar hemisphere is stable measuring 31 x 24 mm in axial plane (series 3, image 8) and up to 44 mm in coronal plane (series 5, image 52). Acute hemorrhage in left cerebellar hemisphere is stable measuring 23 x 18 mm in axial plane (series 3, image 9). Stable subtle density in left external  capsule compatible with subacute hemorrhage. Mass effect from the hematomas within the cerebellum well as associated vasogenic edema results in mild mass effect within the posterior fossa partially effacing the fourth ventricle. No associated hydrocephalus. No new hemorrhage, extra-axial collection, mass effect, stroke, or herniation is identified. Stable nonspecific white matter hypodensities compatible with chronic microvascular ischemic changes and stable volume loss of the brain. Stable small chronic lacunar infarcts within the bilateral thalami and putamen. Vascular: Calcific atherosclerosis of the carotid siphons. No hyperdense vessel identified. Skull: Normal. Negative for fracture or focal lesion. Sinuses/Orbits: Mild mucosal thickening of the sphenoid sinus and ethmoid air cells. Additional visible paranasal sinuses and the mastoid air cells are normally aerated. Orbits are unremarkable. Other: None. IMPRESSION: Stable acute hemorrhage within the bilateral cerebellar hemispheres and subacute hemorrhage within the left external capsule. Stable associated vasogenic edema and mild local mass effect. No new acute intracranial abnormality identified. Electronically Signed   By: Mitzi Hansen M.D.   On: 02/24/2019 00:27   Ct Head Wo Contrast  Result Date: 02/23/2019 CLINICAL DATA:  Emesis and dizziness since this morning. EXAM: CT HEAD WITHOUT CONTRAST TECHNIQUE: Contiguous axial images were obtained from the base of the skull through the vertex without intravenous contrast. COMPARISON:  Head CT dated 01/31/2019. FINDINGS: Brain: 2 foci of acute hemorrhage within the cerebellum. The cerebellar hemorrhage to the RIGHT of midline measures 3.1 x 2.7 cm. The smaller cerebellar hemorrhage to the LEFT of midline measures 2.3 x 1.8 cm. Edema surrounds both sites of hemorrhage with associated local mass effect (sulcal effacement). No midline shift or herniation. Questionable small acute to subacute  hemorrhage within the LEFT external capsule. No additional parenchymal hemorrhage. No extra-axial hemorrhage. No hydrocephalus. Mild chronic small vessel ischemic changes within the deep periventricular white matter regions bilaterally. Vascular: No hyperdense vessel or unexpected calcification. Skull: Normal. Negative for fracture or focal lesion. Sinuses/Orbits: No acute finding. Other: None. IMPRESSION: 1. Two foci of acute hemorrhage within the cerebellum, largest measuring 3 cm greatest dimension. Associated edema at both sites of hemorrhage causing local mass effect with sulcal effacement. No midline shift or evidence of herniation. Distribution suggest hypertensive hemorrhage, possibly complicated by recent commencement of Eliquis. 2. Small acute to subacute hemorrhage within the LEFT external capsule. 3. Mild chronic small vessel ischemic changes within the periventricular white matter. Critical Value/emergent results were called by telephone at the time of interpretation on 02/23/2019 at 6:20 pm to Dr. Leonia Corona , who verbally acknowledged these results. Electronically Signed   By: Bary Richard M.D.   On: 02/23/2019 18:23   Mr Laqueta Jean AV Contrast  Result Date: 02/24/2019 CLINICAL DATA:  Intracranial hemorrhage follow up EXAM: MRI HEAD WITHOUT AND WITH CONTRAST TECHNIQUE: Multiplanar, multiecho pulse sequences of the brain and surrounding structures were obtained without and with intravenous contrast.  CONTRAST:  7.5 mL Gadavist COMPARISON:  Head CT 02/24/2019 FINDINGS: BRAIN: There are magnetic susceptibility effects corresponding to known hemorrhage within both cerebellar hemispheres and within the left posterior basal ganglia/external capsule. There is chronic microhemorrhage evident in the right thalamus. This obscures diffusion analysis. The midline structures are normal. Mass effect in the posterior fossa is unchanged. No midline shift or herniation. The size of the cerebellar hematomas is  unchanged. Multifocal white matter hyperintensity, most commonly due to chronic ischemic microangiopathy. Advanced atrophy for age. No abnormal contrast enhancement. VASCULAR: The major intracranial arterial and venous sinus flow voids are normal. SKULL AND UPPER CERVICAL SPINE: Calvarial bone marrow signal is normal. There is no skull base mass. Visualized upper cervical spine and soft tissues are normal. SINUSES/ORBITS: No fluid levels or advanced mucosal thickening. No mastoid or middle ear effusion. The orbits are normal. IMPRESSION: 1. Unchanged size of intraparenchymal hematomas within both cerebellar hemispheres, right greater than left. 2. Unchanged size of small intraparenchymal hematoma at the junction of the posterior left basal ganglia and external capsule. 3. Mild mass effect on the posterior fossa without hydrocephalus. No supratentorial midline shift or herniation. 4. Mild chronic small vessel disease and age advanced atrophy. Electronically Signed   By: Deatra Robinson M.D.   On: 02/24/2019 13:59    PHYSICAL EXAM: Pleasant middle-age male currently not in distress. . Afebrile. Head is nontraumatic. Neck is supple without bruit.    Cardiac exam no murmur or gallop. Lungs are clear to auscultation. Distal pulses are well felt. Neurological Exam ;  Awake  Alert oriented x 3. Normal speech and language.eye movements full without nystagmus.  Mild saccadic dysmetria on right lateral gaze.  Fundi were not visualized. Vision acuity and fields appear normal. Hearing is significantly diminished bilaterally.  Palatal movements are normal. Face symmetric. Tongue midline. Normal strength, tone, reflexes and coordination. Normal sensation. Gait deferred.   ASSESSMENT/PLAN Mr. LANDUN CZARNY is a 58 y.o. male with history of HTN, AF, DVT, PE on Eliquis and tobacco abuse who developed vomiting and dizziness at home w/ HA, presenting to ED with syncope. CT showed hmg, reversed w/ Andexxa.  Stroke:  B  cerebellar and subacute L external capsule hemorrhage with surrounding edema on eliquis, reversed w/ Andexxa and elevated BP. Unclear if primary ICH vs infarct with hemorrhagic transformation  CT head R & L cerebellar hemorrhages w/ edema. Small L external capsule hmg. Small vessel disease.   CTA head & neck no LVO. Stable hmg B cerebellar and L external capsule  CT head stable B cerebellar hmg and subacute L external capsule hmg. Stable edema and mass effect.  2D Echo 01/29/2019 EF 60-65%. No source of embolus   MRI w/w/o unchanged B cerebellar hmgs R >L. uncahged L BG hmg. Mild mass effect posterior fossa w/o hydro, no shift. Mild Small vessel disease. Atrophy.   HIV canceled by lab interface, reordered for am pending   TCd w/ bubble pending   LDL 157  HgbA1c pending   Check hypercoagulable labs in future (recent Andexxa)  SCDs for VTE prophylaxis  Eliquis (apixaban) daily prior to admission, now on No antithrombotic given hmg  Therapy recommendations:  pending   Disposition:  pending   Ok to be OOB  Cerebral Edema Induced Hypernatremia  Placed on 3% protocol via PIV, now off  Na 138  Atrial Fibrillation  Home anticoagulation:  Eliquis (apixaban) daily   Home meds:  Metoprolol 25 bid  B LE DVT PE  B LE  DVTs per doppler 01/28/2019 on Eliquis  PE 01/2019 on Eliquis  For IVC Filter today  Hypertensive Emergency  BP as high as 195/86  Home meds:  Metoprolol 25 bid  Still on cleviprex  Stable since midnight . Increase SBP goal < 160 . Long-term BP goal normotensive  Other Stroke Risk Factors  Cigarette smoker. advised to stop smoking  ETOH use, hx ETOH abuse, advised to drink no more than 2 drink(s) a day  Hx marijuana use, UDS neg this admission  Family hx stroke  ? Heart failure    Other Active Problems  Fall at home prior to admission  Hypokalemia 3.3  Hospital day # 2  And appears to be neurologically stable.  Continue strict control  of blood pressure.  Continue ongoing stroke work-up.  IVC filter placement today.  Check transcranial Doppler bubble study for PFO.  Mobilize out of bed.  Physical occupational therapy and rehab consults.   This patient is critically ill and at significant risk of neurological worsening, death and care requires constant monitoring of vital signs, hemodynamics,respiratory and cardiac monitoring, extensive review of multiple databases, frequent neurological assessment, discussion with family, other specialists and medical decision making of high complexity.I have made any additions or clarifications directly to the above note.This critical care time does not reflect procedure time, or teaching time or supervisory time of PA/NP/Med Resident etc but could involve care discussion time.  I spent 30 minutes of neurocritical care time  in the care of  this patient.     Delia Heady, MD Medical Director [facility] Pager: [phone] 02/25/2019 9:25 AM   To contact Stroke Continuity provider, please refer to [email or web address]. After hours, contact General Neurology

## 2019-02-26 LAB — BASIC METABOLIC PANEL
Anion gap: 11 (ref 5–15)
BUN: 8 mg/dL (ref 6–20)
CO2: 23 mmol/L (ref 22–32)
Calcium: 9.4 mg/dL (ref 8.9–10.3)
Chloride: 102 mmol/L (ref 98–111)
Creatinine, Ser: 0.9 mg/dL (ref 0.61–1.24)
GFR calc Af Amer: >60 mL/min (ref >60)
GFR calc non Af Amer: >60 mL/min (ref >60)
Glucose, Bld: 108 mg/dL — ABNORMAL HIGH (ref 70–99)
Potassium: 3.4 mmol/L — ABNORMAL LOW (ref 3.5–5.1)
Sodium: 136 mmol/L (ref 135–145)

## 2019-02-26 LAB — CBC
HCT: 44.9 % (ref 39.0–52.0)
Hemoglobin: 15 g/dL (ref 13.0–17.0)
MCH: 31.9 pg (ref 26.0–34.0)
MCHC: 33.4 g/dL (ref 30.0–36.0)
MCV: 95.5 fL (ref 80.0–100.0)
Platelets: 282 10*3/uL (ref 150–400)
RBC: 4.7 MIL/uL (ref 4.22–5.81)
RDW: 12.4 % (ref 11.5–15.5)
WBC: 7.3 10*3/uL (ref 4.0–10.5)
nRBC: 0 % (ref 0.0–0.2)

## 2019-02-26 LAB — HEMOGLOBIN A1C
Hgb A1c MFr Bld: 5.7 % — ABNORMAL HIGH (ref 4.8–5.6)
Mean Plasma Glucose: 117 mg/dL

## 2019-02-26 MED ORDER — AMLODIPINE BESYLATE 5 MG PO TABS
5.0000 mg | ORAL_TABLET | Freq: Every day | ORAL | Status: DC
  Administered 2019-02-27 – 2019-03-01 (×3): 5 mg via ORAL
  Filled 2019-02-26 (×3): qty 1

## 2019-02-26 NOTE — Progress Notes (Signed)
SLP Cancellation Note  Patient Details Name: LAVONTA BEERE MRN: [id number] DOB: 13-Feb-1961   Cancelled treatment:       Reason Eval/Treat Not Completed: Patient at procedure or test/unavailable. Pt currently ambulating with therapy. SLP will re-attempt as able.   Delma Villalva I. Vear Clock, MS, CCC-SLP Acute Rehabilitation Services Office number [phone] Pager [phone]  Scheryl Marten 02/26/2019, 12:08 PM

## 2019-02-26 NOTE — Progress Notes (Signed)
Physical Therapy Treatment Patient Details Name: Cody Porter MRN: [id number] DOB: May 17, 1961 Today's Date: 02/26/2019    History of Present Illness 58 y.o. male with past medical history of PE diagnosed in March, DVT, paroxysmal atrial fibrillation, alcohol abuse, who presented to the emergency room for evaluation of dizziness. CT of the head revealed acute hemorrhage within the bilateral cerebellar hemispheres and subacute hemorrhage within the left external capsule. MRI showed unchanged size of intraparenchymal hematomas within both cerebellar hemispheres, right greater than left. S/p IVC filter placement 4/14     PT Comments    Pt progressing steadily towards his physical therapy goals. Continues to require min assist for transfers, increased ambulation distance to 120 feet with moderate assistance provided for balance. Pt unable to dual task during gait. Pt displays decreased safety awareness, poor dynamic balance, and decreased activity tolerance. Given pt high motivation and PLOF (pt was independent prior to stroke), recommend CIR to maximize functional independence.   BP prior to mobility: 178/106 (123) BP post mobility: 181/88 (115)   Follow Up Recommendations  CIR     Equipment Recommendations  None recommended by PT    Recommendations for Other Services Rehab consult     Precautions / Restrictions Precautions Precautions: Fall Restrictions Weight Bearing Restrictions: No    Mobility  Bed Mobility Overal bed mobility: Needs Assistance Bed Mobility: Supine to Sit     Supine to sit: Min guard     General bed mobility comments: Close min guard for bed mobility  Transfers Overall transfer level: Needs assistance Equipment used: 1 person hand held assist Transfers: Sit to/from Stand Sit to Stand: Min assist         General transfer comment: min assist for safety and stability  Ambulation/Gait Ambulation/Gait assistance: Mod assist Gait Distance (Feet):  120 Feet Assistive device: 1 person hand held assist Gait Pattern/deviations: Step-through pattern;Drifts right/left;Decreased stride length Gait velocity: decreased   General Gait Details: requiring up to moderate assistance for stability with slight posterior lean, slight ataxia and difficulty controlling anterior momentum. Cues for steady pace and for environmental awareness (pt running into objects)   Stairs             Wheelchair Mobility    Modified Rankin (Stroke Patients Only) Modified Rankin (Stroke Patients Only) Pre-Morbid Rankin Score: No symptoms Modified Rankin: Moderately severe disability     Balance Overall balance assessment: Needs assistance;History of Falls Sitting-balance support: Feet supported Sitting balance-Leahy Scale: Fair     Standing balance support: During functional activity Standing balance-Leahy Scale: Fair                              Cognition Arousal/Alertness: Awake/alert Behavior During Therapy: WFL for tasks assessed/performed Overall Cognitive Status: Impaired/Different from baseline Area of Impairment: Following commands;Awareness;Problem solving;Safety/judgement;Attention                   Current Attention Level: Sustained   Following Commands: Follows one step commands with increased time Safety/Judgement: Decreased awareness of safety;Decreased awareness of deficits Awareness: Emergent Problem Solving: Slow processing;Decreased initiation;Difficulty sequencing;Requires verbal cues;Requires tactile cues General Comments: Pt A&Ox3      Exercises      General Comments        Pertinent Vitals/Pain Pain Assessment: No/denies pain Faces Pain Scale: No hurt    Home Living  Prior Function            PT Goals (current goals can now be found in the care plan section) Acute Rehab PT Goals Patient Stated Goal: to go home PT Goal Formulation: With patient Time For  Goal Achievement: 03/11/19 Potential to Achieve Goals: Good Progress towards PT goals: Progressing toward goals    Frequency    Min 4X/week      PT Plan Discharge plan needs to be updated;Frequency needs to be updated    Co-evaluation              AM-PAC PT "6 Clicks" Mobility   Outcome Measure  Help needed turning from your back to your side while in a flat bed without using bedrails?: None Help needed moving from lying on your back to sitting on the side of a flat bed without using bedrails?: A Little Help needed moving to and from a bed to a chair (including a wheelchair)?: A Little Help needed standing up from a chair using your arms (e.g., wheelchair or bedside chair)?: A Little Help needed to walk in hospital room?: A Lot Help needed climbing 3-5 steps with a railing? : A Lot 6 Click Score: 17    End of Session Equipment Utilized During Treatment: Gait belt Activity Tolerance: Patient tolerated treatment well Patient left: with call bell/phone within reach;in bed;with bed alarm set Nurse Communication: Mobility status PT Visit Diagnosis: Unsteadiness on feet (R26.81)     Time: [id number] PT Time Calculation (min) (ACUTE ONLY): 16 min  Charges:  $Gait Training: 8-22 mins                     Laurina Bustlearoline Laneisha Mino, PT, DPT Acute Rehabilitation Services Pager [phone] Office [phone]    Vanetta MuldersCarloine H Zayn Selley 02/26/2019, 3:03 PM

## 2019-02-26 NOTE — Progress Notes (Signed)
Pt transferred from 4N23 to 3W26 after report.  Family notified of tx.

## 2019-02-26 NOTE — Progress Notes (Signed)
  Speech Language Pathology Treatment: Cognitive-Linquistic  Patient Details Name: CHESS TREVETT MRN: [id number] DOB: 06/26/1961 Today's Date: 02/26/2019 Time: 0350-0938 SLP Time Calculation (min) (ACUTE ONLY): 22 min  Assessment / Plan / Recommendation Clinical Impression  Pt was seen for cognitive-linguistic therapy and he indicated that he believes his ability to focus during the session was improved compared to yesterday. He demonstrated 60% accuracy with simple time problems increasing to 80% accuracy with minimal cues but required mod-max cues for moderately complex time problems. He achieved 67% accuracy with recall of four words following a 2-minute delay increasing to 100% accuracy with minimal cues. He completed simple reasoing tasks with 80% accuracy increasing to 100% accuracy with minimal cues. Mental manipulation sequencing tasks were completed with 60% accuracy increasing to 80% accuracy with moderate cues.  SLP will continue to follow pt.    HPI HPI: Pt is a 58 y.o. male with past medical history of PE diagnosed in March, DVT, paroxysmal atrial fibrillation, alcohol abuse, who presented to the emergency room for evaluation of dizziness. CT of the head revealed acute hemorrhage within the bilateral cerebellar hemispheres and subacute hemorrhage within the left external capsule. MRI showed unchanged size of intraparenchymal hematomas within both cerebellar hemispheres, right greater than left.      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

## 2019-02-26 NOTE — Progress Notes (Signed)
Rehab Admissions Coordinator Note:  Per PT recommendation, this patient was screened by Nanine Means for appropriateness for an Inpatient Acute Rehab Consult.  At this time, we are recommending Inpatient Rehab consult. AC will contact MD to request an IP Rehab Consult Order.   Nanine Means 02/26/2019, 3:20 PM  I can be reached at [phone].

## 2019-02-26 NOTE — Progress Notes (Signed)
STROKE TEAM PROGRESS NOTE   INTERVAL HISTORY Patient had IVC filter placed yesterday uneventfully.  He was seen by physical occupational therapy who recommended inpatient rehab.  Blood pressure adequately controlled.  No complaints today.  Vitals:   02/26/19 0500 02/26/19 0600 02/26/19 0700 02/26/19 0800  BP: 131/80 (!) 145/93 (!) 146/83   Pulse: 80 66 68   Resp: Temp:    97.6 F (36.4 C)  TempSrc:    Oral  SpO2: 96% 96% 97%   Weight:      Height:        CBC:  Recent Labs  Lab 02/23/19 1639 02/25/19 0403 02/26/19 0235  WBC 7.3 8.2 7.3  NEUTROABS 5.6  --   --   HGB 15.3 16.8 15.0  HCT 47.3 50.4 44.9  MCV 98.3 96.2 95.5  PLT 286 279 282    Basic Metabolic Panel:  Recent Labs  Lab 02/25/19 0403 02/26/19 0235  NA 138 136  K 3.3* 3.4*  CL 103 102  CO2 23 23  GLUCOSE 101* 108*  BUN <5* 8  CREATININE 0.92 0.90  CALCIUM 9.5 9.4   Lipid Panel:     Component Value Date/Time   CHOL 249 (H) 02/25/2019 0403   TRIG 225 (H) 02/25/2019 0403   HDL 47 02/25/2019 0403   CHOLHDL 5.3 02/25/2019 0403   VLDL 45 (H) 02/25/2019 0403   LDLCALC 157 (H) 02/25/2019 0403   HgbA1c:  Lab Results  Component Value Date   HGBA1C 5.7 (H) 02/25/2019   Urine Drug Screen:     Component Value Date/Time   LABOPIA NONE DETECTED 02/23/2019 1957   COCAINSCRNUR NONE DETECTED 02/23/2019 1957   LABBENZ NONE DETECTED 02/23/2019 1957   AMPHETMU NONE DETECTED 02/23/2019 1957   THCU NONE DETECTED 02/23/2019 1957   LABBARB NONE DETECTED 02/23/2019 1957    Alcohol Level     Component Value Date/Time   ETH <10 01/28/2019 1554    IMAGING Mr Brain W Wo Contrast  Result Date: 02/24/2019 CLINICAL DATA:  Intracranial hemorrhage follow up EXAM: MRI HEAD WITHOUT AND WITH CONTRAST TECHNIQUE: Multiplanar, multiecho pulse sequences of the brain and surrounding structures were obtained without and with intravenous contrast. CONTRAST:  7.5 mL Gadavist COMPARISON:  Head CT 02/24/2019  FINDINGS: BRAIN: There are magnetic susceptibility effects corresponding to known hemorrhage within both cerebellar hemispheres and within the left posterior basal ganglia/external capsule. There is chronic microhemorrhage evident in the right thalamus. This obscures diffusion analysis. The midline structures are normal. Mass effect in the posterior fossa is unchanged. No midline shift or herniation. The size of the cerebellar hematomas is unchanged. Multifocal white matter hyperintensity, most commonly due to chronic ischemic microangiopathy. Advanced atrophy for age. No abnormal contrast enhancement. VASCULAR: The major intracranial arterial and venous sinus flow voids are normal. SKULL AND UPPER CERVICAL SPINE: Calvarial bone marrow signal is normal. There is no skull base mass. Visualized upper cervical spine and soft tissues are normal. SINUSES/ORBITS: No fluid levels or advanced mucosal thickening. No mastoid or middle ear effusion. The orbits are normal. IMPRESSION: 1. Unchanged size of intraparenchymal hematomas within both cerebellar hemispheres, right greater than left. 2. Unchanged size of small intraparenchymal hematoma at the junction of the posterior left basal ganglia and external capsule. 3. Mild mass effect on the posterior fossa without hydrocephalus. No supratentorial midline shift or herniation. 4. Mild chronic small vessel disease and age advanced atrophy. Electronically Signed   By: Chrisandra Netters.D.  On: 02/24/2019 13:59   Ir Ivc Filter Plmt / S&i /img Guid/mod Sed  Result Date: 02/25/2019 INDICATION: 58 year old with history of bilateral pulmonary emboli and recent intracranial hemorrhage. Patient needs to stop anticoagulation and needs pulmonary embolism prophylaxis. EXAM: IVC FILTER PLACEMENT; IVC VENOGRAM; ULTRASOUND FOR VASCULAR ACCESS Physician: Rachelle HoraAdam R. Lowella DandyHenn, MD MEDICATIONS: None. ANESTHESIA/SEDATION: Fentanyl 25 mcg IV; Versed 1.0 mg IV Moderate Sedation Time:  17 minutes The  patient was continuously monitored during the procedure by the interventional radiology nurse under my direct supervision. CONTRAST:  40 mL Omnipaque 300 FLUOROSCOPY TIME:  Fluoroscopy Time: 1 minutes, 12 seconds, 28 mGy COMPLICATIONS: None immediate. PROCEDURE: The procedure was explained to the patient. The risks and benefits of the procedure were discussed and the patient's questions were addressed. Informed consent was obtained from the patient. Ultrasound demonstrated a patent right internal jugular vein. Ultrasound images were obtained for documentation. The right side of the neck was prepped and draped in a sterile fashion. Maximal barrier sterile technique was utilized including caps, mask, sterile gowns, sterile gloves, sterile drape, hand hygiene and skin antiseptic. The skin was anesthetized with 1% lidocaine. A 21 gauge needle was directed into the vein with ultrasound guidance and a micropuncture dilator set was placed. A wire was advanced into the IVC. The filter sheath was advanced over the wire into the IVC. An IVC venogram was performed. Fluoroscopic images were obtained for documentation. A Bard Denali filter was deployed below the lowest renal vein. A follow-up venogram was performed and the vascular sheath was removed with manual compression. FINDINGS: IVC was patent. Bilateral renal veins were identified. The filter was deployed below the lowest renal vein. Follow-up venogram confirmed placement within the IVC and below the renal veins. IMPRESSION: Successful placement of a retrievable IVC filter. PLAN: This IVC filter is potentially retrievable. The patient will be assessed for filter retrieval by Interventional Radiology in approximately 8-12 weeks. Further recommendations regarding filter retrieval, continued surveillance or declaration of device permanence, will be made at that time. Electronically Signed   By: Richarda OverlieAdam  Henn M.D.   On: 02/25/2019 13:04   Vas [place] Transcranial Doppler W  Bubbles  Result Date: 02/25/2019  Transcranial Doppler with Bubble Indications: Stroke. Performing Technologist: Jeb LeveringJill Parker RDMS, RVT  Examination Guidelines: A complete evaluation includes B-mode imaging, spectral Doppler, color Doppler, and power Doppler as needed of all accessible portions of each vessel. Bilateral testing is considered an integral part of a complete examination. Limited examinations for reoccurring indications may be performed as noted.  Summary:  A vascular evaluation was performed. The right middle cerebral artery was studied. An IV was inserted into the patient's left forearm. Verbal informed consent was obtained.  No HITS at rest or during Valsalva. No apparent PFO. Negative TCD Bubble study *See table(s) above for measurements and observations.  Diagnosing physician: Delia HeadyPramod Sethi MD Electronically signed by Delia HeadyPramod Sethi MD on 02/25/2019 at 5:07:19 PM.    Final     PHYSICAL EXAM: Pleasant middle-age male currently not in distress. . Afebrile. Head is nontraumatic. Neck is supple without bruit.    Cardiac exam no murmur or gallop. Lungs are clear to auscultation. Distal pulses are well felt. Neurological Exam ;  Awake  Alert oriented x 3. Normal speech and language.eye movements full without nystagmus.  Mild saccadic dysmetria on right lateral gaze.  Fundi were not visualized. Vision acuity and fields appear normal. Hearing is significantly diminished bilaterally.  Palatal movements are normal. Face symmetric. Tongue midline. Normal strength, tone,  reflexes and coordination. Normal sensation. Gait deferred.   ASSESSMENT/PLAN Cody Porter is a 58 y.o. male with history of HTN, AF, DVT, PE on Eliquis and tobacco abuse who developed vomiting and dizziness at home w/ HA, presenting to ED with syncope. CT showed hmg, reversed w/ Andexxa.  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  Unclear if primary ICH vs infarct with hemorrhagic transformation  CT head R & L cerebellar hemorrhages w/ edema. Small L external capsule hmg. Small vessel disease.   CTA head & neck no LVO. Stable hmg B cerebellar and L external capsule  CT head stable B cerebellar hmg and subacute L external capsule hmg. Stable edema and mass effect.  2D Echo 01/29/2019 EF 60-65%. No source of embolus   MRI w/w/o unchanged B cerebellar hmgs R >L. uncahged L BG hmg. Mild mass effect posterior fossa w/o hydro, no shift. Mild Small vessel disease. Atrophy.   TCD w/ bubble no HITS, no PFO  LDL 157  HgbA1c 5.7  Check hypercoagulable labs in future (recent Andexxa)  SCDs for VTE prophylaxis  Eliquis (apixaban) daily prior to admission, now on No antithrombotic given hmg  Therapy recommendations:  SNF  Disposition:  pending   Cerebral Edema Induced Hypernatremia  Placed on 3% protocol via PIV, now off  Na 136  Atrial Fibrillation  Home anticoagulation:  Eliquis (apixaban) daily   Home meds:  Metoprolol 25 bid  B LE DVT PE  B LE DVTs per doppler 01/28/2019 on Eliquis  PE 01/2019 on Eliquis  IVC Filter placed 02/25/19  Hypertensive Emergency  BP as high as 195/86  Home meds:  Metoprolol 25 bid  Remains on cleviprex  Add norvasc 5 mg daily . Increase SBP goal < 160 . Long-term BP goal normotensive  Other Stroke Risk Factors  Cigarette smoker. advised to stop smoking  ETOH use, hx ETOH abuse, advised to drink no more than 2 drink(s) a day  Hx marijuana use, UDS neg this admission  Family hx stroke  ? Heart failure    Other Active Problems  Fall at home prior to admission  Hypokalemia 3.4  Hospital day # 3  Continue mobilize out of bed.  Physical occupational therapy and inpatient rehab consults.  Strict blood pressure control.  Transfer to neurology floor bed.  Hopefully transfer to rehab in the next few days.  No family available at the bedside for discussion.   Discussed with Dr. Shona Simpson critical care medicine.  We will start aspirin 81 mg at time of discharge.This patient is critically ill and at significant risk of neurological worsening, death and care requires constant monitoring of vital signs, hemodynamics,respiratory and cardiac monitoring, extensive review of multiple databases, frequent neurological assessment, discussion with family, other specialists and medical decision making of high complexity.I have made any additions or clarifications directly to the above note.This critical care time does not reflect procedure time, or teaching time or supervisory time of PA/NP/Med Resident etc but could involve care discussion time.  I spent 30 minutes of neurocritical care time  in the care of  this patient.      To contact Stroke Continuity provider, please refer to [email or web address]. After hours, contact General Neurology

## 2019-02-27 LAB — BASIC METABOLIC PANEL
Anion gap: 14 (ref 5–15)
BUN: 13 mg/dL (ref 6–20)
CO2: 22 mmol/L (ref 22–32)
Calcium: 9.3 mg/dL (ref 8.9–10.3)
Chloride: 101 mmol/L (ref 98–111)
Creatinine, Ser: 0.9 mg/dL (ref 0.61–1.24)
GFR calc Af Amer: >60 mL/min (ref >60)
GFR calc non Af Amer: >60 mL/min (ref >60)
Glucose, Bld: 103 mg/dL — ABNORMAL HIGH (ref 70–99)
Potassium: 3.5 mmol/L (ref 3.5–5.1)
Sodium: 137 mmol/L (ref 135–145)

## 2019-02-27 LAB — CBC
HCT: 47.1 % (ref 39.0–52.0)
Hemoglobin: 15.5 g/dL (ref 13.0–17.0)
MCH: 31.1 pg (ref 26.0–34.0)
MCHC: 32.9 g/dL (ref 30.0–36.0)
MCV: 94.4 fL (ref 80.0–100.0)
Platelets: 300 10*3/uL (ref 150–400)
RBC: 4.99 MIL/uL (ref 4.22–5.81)
RDW: 12.5 % (ref 11.5–15.5)
WBC: 6 10*3/uL (ref 4.0–10.5)
nRBC: 0 % (ref 0.0–0.2)

## 2019-02-27 NOTE — Progress Notes (Signed)
Inpatient Rehabilitation Admissions Coordinator  Inpatient Rehab Consult received. I met with patient at the bedside for rehabilitation assessment. We discussed goals and expectations of an inpatient rehab admission.  He requests that I contact his Mom to discuss his rehab needs. I left her a voicemail and await her call back.Patient is an appropriate candidate for an inpt rehab admit pending discussion with his Mom. Hopeful for admission tomorrow.  Danne Baxter, RN, MSN Rehab Admissions Coordinator [phone] 02/27/2019 11:17 AM

## 2019-02-27 NOTE — Progress Notes (Signed)
Physical Therapy Treatment Patient Details Name: Cody Porter MRN: [id number] DOB: [birth year] Today's Date: 02/27/2019    History of Present Illness Pt is a 58 y.o. male with past medical history of PE diagnosed in March, DVT, paroxysmal atrial fibrillation, alcohol abuse, who presented to the emergency room for evaluation of dizziness. CT of the head revealed acute hemorrhage within the bilateral cerebellar hemispheres and subacute hemorrhage within the left external capsule. MRI showed unchanged size of intraparenchymal hematomas within both cerebellar hemispheres, right greater than left. S/p IVC filter placement 4/14     PT Comments    Pt making steady progress with functional mobility. He continues to demonstrate a very unsteady, ataxic gait pattern requiring min-mod A for balance and safety. Pt making progress from previous session with bed mobility and transfers. Pt would continue to benefit from skilled physical therapy services at this time while admitted and after d/c to address the below listed limitations in order to improve overall safety and independence with functional mobility.    Follow Up Recommendations  CIR     Equipment Recommendations  None recommended by PT    Recommendations for Other Services       Precautions / Restrictions Precautions Precautions: Fall Restrictions Weight Bearing Restrictions: No    Mobility  Bed Mobility Overal bed mobility: Needs Assistance Bed Mobility: Supine to Sit     Supine to sit: Supervision     General bed mobility comments: supervision for safety; pt achieving sitting upright at EOB towards his L side  Transfers Overall transfer level: Needs assistance Equipment used: None Transfers: Sit to/from Stand Sit to Stand: Min assist;Min guard         General transfer comment: initially min A for stability, progressing to min guard with practice  Ambulation/Gait Ambulation/Gait assistance: Mod assist;Min assist Gait  Distance (Feet): 40 Feet Assistive device: None Gait Pattern/deviations: Ataxic;Decreased stride length;Step-through pattern;Drifts right/left;Staggering right;Staggering left Gait velocity: decreased   General Gait Details: pt with modest instability requiring min-mod A to maintain upright balance; pt also requiring standing breaks x2 to regain balance   Stairs             Wheelchair Mobility    Modified Rankin (Stroke Patients Only) Modified Rankin (Stroke Patients Only) Pre-Morbid Rankin Score: No symptoms Modified Rankin: Moderately severe disability     Balance Overall balance assessment: Needs assistance;History of Falls Sitting-balance support: Feet supported Sitting balance-Leahy Scale: Good     Standing balance support: During functional activity Standing balance-Leahy Scale: Poor                              Cognition Arousal/Alertness: Awake/alert Behavior During Therapy: Flat affect Overall Cognitive Status: Impaired/Different from baseline Area of Impairment: Safety/judgement;Problem solving                         Safety/Judgement: Decreased awareness of safety;Decreased awareness of deficits   Problem Solving: Slow processing;Difficulty sequencing;Requires verbal cues        Exercises General Exercises - Lower Extremity Hip ABduction/ADduction: Standing;AROM;Strengthening;Both;10 reps Hip Flexion/Marching: Standing;AROM;Strengthening;Both;10 reps Other Exercises Other Exercises: 5x sit<>stand, twice (total of 10) from recliner chair with UEs on arm rests    General Comments        Pertinent Vitals/Pain Pain Assessment: No/denies pain    Home Living  Prior Function            PT Goals (current goals can now be found in the care plan section) Acute Rehab PT Goals PT Goal Formulation: With patient Time For Goal Achievement: 03/11/19 Potential to Achieve Goals: Good Progress towards  PT goals: Progressing toward goals    Frequency    Min 4X/week      PT Plan Current plan remains appropriate    Co-evaluation              AM-PAC PT "6 Clicks" Mobility   Outcome Measure  Help needed turning from your back to your side while in a flat bed without using bedrails?: A Little Help needed moving from lying on your back to sitting on the side of a flat bed without using bedrails?: A Little Help needed moving to and from a bed to a chair (including a wheelchair)?: A Little Help needed standing up from a chair using your arms (e.g., wheelchair or bedside chair)?: A Little Help needed to walk in hospital room?: A Lot Help needed climbing 3-5 steps with a railing? : A Lot 6 Click Score: 16    End of Session Equipment Utilized During Treatment: Gait belt Activity Tolerance: Patient tolerated treatment well Patient left: in chair;with call bell/phone within reach;with chair alarm set Nurse Communication: Mobility status PT Visit Diagnosis: Unsteadiness on feet (R26.81)     Time: [id number] PT Time Calculation (min) (ACUTE ONLY): 17 min  Charges:  $Therapeutic Activity: 8-22 mins                     Cody Porter, PT, DPT  Acute Rehabilitation Services Pager [phone] Office [phone]     Cody Porter 02/27/2019, 2:03 PM

## 2019-02-27 NOTE — Progress Notes (Signed)
  Speech Language Pathology Treatment: Cognitive-Linquistic  Patient Details Name: Cody Porter MRN: [id number] DOB: 06/10/1961 Today's Date: 02/27/2019 Time: 6629-4765 SLP Time Calculation (min) (ACUTE ONLY): 22 min  Assessment / Plan / Recommendation Clinical Impression  Pt reported that he was was tired since he did not sleep well last night but agreed to participated in an abbreviated session. He provided 6-9 items per abstract category during divergent naming tasks with average of 7 items per category. He achieved 80% accuracy with money calculations increasing to 100% accuracy with minimal cues. He was demonstrated 40% accuracy with recall of concrete information from voicemail increasing to 70% accuracy with mod cues. SLP will continue to follow pt.    HPI HPI: Pt is a 58 y.o. male with past medical history of PE diagnosed in March, DVT, paroxysmal atrial fibrillation, alcohol abuse, who presented to the emergency room for evaluation of dizziness. CT of the head revealed acute hemorrhage within the bilateral cerebellar hemispheres and subacute hemorrhage within the left external capsule. MRI showed unchanged size of intraparenchymal hematomas within both cerebellar hemispheres, right greater than left.      SLP Plan  Continue with current plan of care       Recommendations                   Follow up Recommendations: 24 hour supervision/assistance;Inpatient Rehab SLP Visit Diagnosis: Cognitive communication deficit (Y65.035) Plan: Continue with current plan of care       Cody Porter I. Cody Clock, MS, CCC-SLP Acute Rehabilitation Services Office number [phone] Pager [phone]      Cody Porter 02/27/2019, 4:26 PM

## 2019-02-27 NOTE — Progress Notes (Signed)
Patient's mother Amado Coe brought patient clean clothes.  Patient's dirty clothes was given to patient's mother along with patient prescriptions of eliquis and metoprolol

## 2019-02-27 NOTE — PMR Pre-admission (Signed)
PMR Admission Coordinator Pre-Admission Assessment  Patient: Cody Porter is an 58 y.o., male MRN: [id number] DOB: Mar 22, 1961 Height: _0  (172.7 cm) Weight: 78.1 kg  Insurance Information  PRIMARY: uninsured       Development worker, community Lynwood Dawley [phone]. Mom states she is applying for medicaid and disability through Development worker, community. I left Voicemail for [facility] on 4/16 to clarify if this was indeed happening.  Medicaid Application Date:       Case Manager:  Disability Application Date:       Case Worker:   The "Data Collection Information Summary" for patients in Inpatient Rehabilitation Facilities with attached "Privacy Act [place] Records" was provided and verbally reviewed with: N/A  Emergency Contact Information Contact Information    Name Relation Home Work Mobile   Matherly,[name] Mother   [phone]      Current Medical History  Patient Admitting Diagnosis: B Cerebellar hemorrhage  History of Present Illness: 58 year old male with past medical history of PE diagnosed 01/2019 started on Eliquis, DVT, paroxysmal atrial fibrillation, alcohol abuse, presented to ED to ED for dizziness on 02/23/2019. CT showed bilateral cerebellar bleeds with no hydrocephalus or midline shift. Etiology felt likely hypertension in the setting of coagulopathy from Eliquis. Andexxa given for reversal.  2 d echo 01/29/2019 EF 60 to 65%. No source of embolus. MRI unchanged B cerebellar hemorrhages R>L. TCD w/ bubble no HITS, No PFO. LDL 157 Hgb A1c 5.7. To check hypercoagulable labs in future ( recent [place]). SCDs for VTE prophylaxis. Eliquis on hold.   Initially placed on 3% protocol , now off. Metoprolol on home regimen for atrial fibrillation. IVC filter placed on 02/25/2019.Initially on Cleviprex, added Norvasc. Will plan start ASA 81 mg at time of discharge.    Complete NIHSS TOTAL: 0  Patient's medical [facility] from [facility]  has been reviewed by the rehabilitation  admission coordinator and physician.  Past Medical History  Past Medical History:  Diagnosis Date  . ETOH abuse 01/28/2019  . Leg DVT (deep venous thromboembolism), acute, bilateral ([place]) 01/28/2019  . Pulmonary embolus ([place]) 01/28/2019    Family History   Family history is unknown by patient.  Prior Rehab/Hospitalizations Has the patient had prior rehab or hospitalizations prior to admission? Yes  Has the patient had major surgery during 100 days prior to admission? No   Current Medications  Current Facility-Administered Medications:  .  acetaminophen (TYLENOL) tablet 650 mg, 650 mg, Oral, Q4H PRN, 650 mg at 02/28/19 0830 **OR** acetaminophen (TYLENOL) solution 650 mg, 650 mg, Per Tube, Q4H PRN **OR** acetaminophen (TYLENOL) suppository 650 mg, 650 mg, Rectal, Q4H PRN, Biby, Sharon L, NP .  amLODipine (NORVASC) tablet 5 mg, 5 mg, Oral, Daily, Biby, Sharon L, NP, 5 mg at 02/28/19 0830 .  aspirin EC tablet 81 mg, 81 mg, Oral, Daily, Biby, Sharon L, NP .  hydrALAZINE (APRESOLINE) injection 20 mg, 20 mg, Intravenous, Q8H PRN, Biby, Sharon L, NP .  labetalol (NORMODYNE) injection 20 mg, 20 mg, Intravenous, Q8H PRN, Biby, Sharon L, NP .  metoprolol tartrate (LOPRESSOR) tablet 25 mg, 25 mg, Oral, BID, Biby, Sharon L, NP, 25 mg at 02/28/19 0830 .  senna-docusate (Senokot-S) tablet 1 tablet, 1 tablet, Oral, BID, Biby, Sharon L, NP, 1 tablet at 02/28/19 0830  Patients Current Diet:  Diet Order            Diet Heart Room service appropriate? Yes with Assist; Fluid consistency: Thin  Diet effective now  Precautions / Restrictions Precautions Precautions: Fall Restrictions Weight Bearing Restrictions: No   Has the patient had 2 or more falls or a fall with injury in the past year? No  Prior Activity Level Community (5-7x/wk): walks daily; does not drive, has license but no car  Prior Functional Level Self Care: Did the patient need help bathing, dressing, using the  toilet or eating? Independent  Indoor Mobility: Did the patient need assistance with walking from room to room (with or without device)? Independent  Stairs: Did the patient need assistance with internal or external stairs (with or without device)? Independent  Functional Cognition: Did the patient need help planning regular tasks such as shopping or remembering to take medications? Needed some help  Home Assistive Devices / Equipment Home Equipment: None  Prior Device Use: Indicate devices/aids used by the patient prior to current illness, exacerbation or injury? None of the above  Current Functional Level Cognition  Arousal/Alertness: Awake/alert Overall Cognitive Status: Impaired/Different from baseline Current Attention Level: Sustained Orientation Level: Disoriented to time Following Commands: Follows one step commands inconsistently Safety/Judgement: Decreased awareness of safety, Decreased awareness of deficits General Comments: Pt A&Ox3 Attention: Focused, Sustained Focused Attention: Impaired Focused Attention Impairment: Verbal complex(Vigilance impaired: 0/1) Sustained Attention: Impaired Sustained Attention Impairment: Verbal complex(Serial 7s: 0/3) Memory: Impaired Memory Impairment: Retrieval deficit, Decreased short term memory Decreased Short Term Memory: Verbal complex(Immediate memory: 5/5; Delayed: 5/5) Awareness: Impaired Awareness Impairment: Intellectual impairment Executive Function: Reasoning, Sequencing Reasoning: Impaired Reasoning Impairment: Verbal complex(Asbtraction: 1/2) Sequencing: Impaired Sequencing Impairment: Verbal complex(Difficulty drawing clock: 1/3)    Extremity Assessment (includes Sensation/Coordination)  Upper Extremity Assessment: Generalized weakness, LUE deficits/detail, RUE deficits/detail RUE Deficits / Details: bil. UEs tremulous  RUE Coordination: decreased fine motor, decreased gross motor LUE Deficits / Details: bil. UEs  tremulous  LUE Coordination: decreased fine motor, decreased gross motor  Lower Extremity Assessment: Generalized weakness    ADLs  Overall ADL's : Needs assistance/impaired Eating/Feeding: Set up, Sitting Grooming: Wash/dry hands, Wash/dry face, Brushing hair, Standing, Moderate assistance Grooming Details (indicate cue type and reason): verbal cues  Upper Body Bathing: Maximal assistance, Sitting Lower Body Bathing: Maximal assistance, Sit to/from stand Upper Body Dressing : Total assistance, Sitting Lower Body Dressing: Moderate assistance, Sit to/from stand Lower Body Dressing Details (indicate cue type and reason): increased effort to access feet, and assist for balance  Toilet Transfer: Minimal assistance, +2 for physical assistance, +2 for safety/equipment, Ambulation, Comfort height toilet, Grab bars Toileting- Clothing Manipulation and Hygiene: Moderate assistance, Sit to/from stand Functional mobility during ADLs: Minimal assistance, +2 for physical assistance, +2 for safety/equipment General ADL Comments: Pt required max cues to problem solve and sequence how to spnge bathe.  He initially picke up a folded bath towel, wet it with cold water, then rubbed the folded edge lightly over his chest and indicated he was done.  He reuquired max cues to use appropropriate wash cloths, to apply soap, and to wash thoroughly.  He was highly distracted by his lines     Mobility  Overal bed mobility: Needs Assistance Bed Mobility: Supine to Sit Supine to sit: Supervision General bed mobility comments: pt achieving sitting EOB upon arrival with bed alarm going off and all bed rails up    Transfers  Overall transfer level: Needs assistance Equipment used: None Transfers: Sit to/from Stand Sit to Stand: Mod assist General transfer comment: mod A for stability and safety due to impulsiveness; pt also with LOB towards his R side upon standing  Ambulation / Gait / Stairs / Wheelchair  Mobility  Ambulation/Gait Ambulation/Gait assistance: Mod assist, Min assist Gait Distance (Feet): 30 Feet Assistive device: None Gait Pattern/deviations: Ataxic, Decreased stride length, Step-through pattern, Drifts right/left, Staggering right, Staggering left General Gait Details: pt with modest instability requiring min-mod A to maintain upright balance; pt with significant LOB towards his R side requiring mod A to maintain Gait velocity: decreased Gait velocity interpretation: <1.8 ft/sec, indicate of risk for recurrent falls    Posture / Balance Balance Overall balance assessment: Needs assistance, History of Falls Sitting-balance support: Feet supported Sitting balance-Leahy Scale: Fair Standing balance support: During functional activity Standing balance-Leahy Scale: Poor Standing balance comment: some intability in standing during task performance    Special needs/care consideration BiPAP/CPAP  N/a CPM  N/a Continuous Drip IV  N/a Dialysis n/a Life Vest  N/a Oxygen  N/a Special Bed  N/a Trach Size  N/a Wound Vac n/a Skin  Dressing to anterior right side of neck             Bowel mgmt:  Continent LBM 02/26/2019 Bladder mgmt:  continent Diabetic mgmt:  Hgb A1c 5.7 Behavioral consideration  N/a Chemo/radiation  N/a Patient did drink before his admit to [facility] 01/2019. Mom states he has not drinked since home. Patient wanted to go home [place] 4/16 and 4/17, but after speaking with his Mom, MD, staff and myself is aware of the need to remain for his rehab recovery   Previous Home Environment  Living Arrangements: Parent  Lives With: Other (Comment) Available Help at Discharge: Family, Available 24 hours/day Type of Home: Apartment Home Layout: Two level, 1/2 bath on main level, Bed/bath upstairs, Able to live on main level with bedroom/bathroom Alternate Level Stairs-Rails: Right, Left Alternate Level Stairs-Number of Steps: reports mother sleeps on first floor usuually, Home  Access: Stairs to enter Entrance Stairs-Rails: None Entrance Stairs-Number of Steps: 1 to 3 Bathroom Shower/Tub: Tub/shower unit, Architectural technologist: Standard Bathroom Accessibility: Yes How Accessible: Accessible via walker [place]: No  Discharge Living Setting Plans for Discharge Living Setting: Lives with (comment)(Mom) Type of Home at Discharge: Apartment Discharge Home Layout: Two level, 1/2 bath on main level, Bed/bath upstairs, Able to live on main level with bedroom/bathroom Alternate Level Stairs-Number of Steps: 13 Discharge Home Access: Stairs to enter Entrance Stairs-Rails: None Entrance Stairs-Number of Steps: 1 to 3 Discharge Bathroom Shower/Tub: Tub/shower unit, Curtain Discharge Bathroom Toilet: Standard Discharge Bathroom Accessibility: Yes How Accessible: Accessible via walker Does the patient have any problems obtaining your medications?: Yes (Describe)  Social/Family/Support Systems Contact Information: Mom, Delora Fuel Anticipated Caregiver: Mom Anticipated Caregiver's Contact Information: [phone] Ability/Limitations of Caregiver: no limitations, 58 year old Caregiver Availability: 24/7 Discharge Plan Discussed with Primary Caregiver: Yes Is Caregiver In Agreement with Plan?: Yes Does Caregiver/Family have Issues with Lodging/Transportation while Pt is in Rehab?: No  Goals/Additional Needs Patient/Family Goal for Rehab: Mod I to supervision PT, OT, and SLP Expected length of stay: ELOS 10 to 14 days Pt/Family Agrees to Admission and willing to participate: Yes Program Orientation Provided & Reviewed with Pt/Caregiver Including Roles  & Responsibilities: Yes  Barriers to Discharge: Insurance for SNF coverage  Decrease burden of Care through IP rehab admission: n/a  Possible need for SNF placement upon discharge:  Not anticipated Patient Condition: I have reviewed medical records from [facility] , spoken with CM, and  patient and family member, Mom. I met with patient at the bedside for inpatient rehabilitation assessment.  Patient will benefit from ongoing PT, OT and SLP, can actively participate in 3 hours of therapy a day 5 days of the week, and can make measurable gains during the admission.  Patient will also benefit from the coordinated team approach during an Inpatient Acute Rehabilitation admission.  The patient will receive intensive therapy as well as Rehabilitation physician, nursing, social worker, and care management interventions.  Due to safety, disease management, medication administration and patient education the patient requires 24 hour a day rehabilitation nursing.  The patient is currently mod assist with mobility and basic ADLs.  Discharge setting and therapy post discharge at home with home health is anticipated.  Patient has agreed to participate in the Acute Inpatient Rehabilitation Program and will admit Saturday, 4/18 when bed is available.  Preadmission Screen Completed By:  Cleatrice Burke RN MSN, 02/28/2019 2:42 PM ______________________________________________________________________   Discussed status with Dr. Naaman Plummer  on 02/28/2019 at 1249 and received approval for admission Saturday 03/01/2019 when bed is available.  Admission Coordinator:  Cleatrice Burke, RN MSN, time  1249 Date  02/28/2019   Assessment/Plan: Diagnosis: bilateral cerebellar hemorrhages 1. Does the need for close, 24 hr/day Medical supervision in concert with the patient's rehab needs make it unreasonable for this patient to be served in a less intensive setting? Yes 2. Co-Morbidities requiring supervision/potential complications: hx of recent PE, HTN, a fib 3. Due to bladder management, bowel management, safety, skin/wound care, disease management, medication administration, pain management and patient education, does the patient require 24 hr/day rehab nursing? Yes 4. Does the patient require  coordinated care of a physician, rehab nurse, PT (1-2 hrs/day, 5 days/week), OT (1-2 hrs/day, 5 days/week) and SLP (1-2 hrs/day, 5 days/week) to address physical and functional deficits in the context of the above medical diagnosis(es)? Yes Addressing deficits in the following areas: balance, endurance, locomotion, strength, transferring, bowel/bladder control, bathing, dressing, feeding, grooming, toileting, cognition, speech and psychosocial support 5. Can the patient actively participate in an intensive therapy program of at least 3 hrs of therapy 5 days a week? Yes 6. The potential for patient to make measurable gains while on inpatient rehab is excellent 7. Anticipated functional outcomes upon discharge from inpatients are: modified independent and supervision PT, modified independent and supervision OT, modified independent and supervision SLP 8. Estimated rehab length of stay to reach the above functional goals is: 10-14 days 9. Anticipated D/C setting: Home 10. Anticipated post D/C treatments: [place] therapy 11. Overall Rehab/Functional Prognosis: excellent  MD Signature: Meredith Staggers, MD, [place] [facility] 03/01/2019

## 2019-02-27 NOTE — Progress Notes (Signed)
STROKE TEAM PROGRESS NOTE   INTERVAL HISTORY Patient remains neurologically stable.  He is awaiting transfer to inpatient rehab after insurance approval and bed availability.  He has no new complaints.  Vitals:   02/27/19 0024 02/27/19 0424 02/27/19 0758 02/27/19 1148  BP: (!) 155/88 (!) 153/94 (!) 159/103 (!) 154/89  Pulse: 79 77 97 77  Resp: 17 18 20 20   Temp: 97.8 F (36.6 C) 98 F (36.7 C) 97.7 F (36.5 C) (!) 97.4 F (36.3 C)  TempSrc: Oral Axillary Oral Oral  SpO2: 96% 97% 94% 99%  Weight:      Height:        CBC:  Recent Labs  Lab 02/23/19 1639  02/26/19 0235 02/27/19 0614  WBC 7.3   < > 7.3 6.0  NEUTROABS 5.6  --   --   --   HGB 15.3   < > 15.0 15.5  HCT 47.3   < > 44.9 47.1  MCV 98.3   < > 95.5 94.4  PLT 286   < > 282 300   < > = values in this interval not displayed.    Basic Metabolic Panel:  Recent Labs  Lab 02/26/19 0235 02/27/19 0614  NA 136 137  K 3.4* 3.5  CL 102 101  CO2 23 22  GLUCOSE 108* 103*  BUN 8 13  CREATININE 0.90 0.90  CALCIUM 9.4 9.3   Lipid Panel:     Component Value Date/Time   CHOL 249 (H) 02/25/2019 0403   TRIG 225 (H) 02/25/2019 0403   HDL 47 02/25/2019 0403   CHOLHDL 5.3 02/25/2019 0403   VLDL 45 (H) 02/25/2019 0403   LDLCALC 157 (H) 02/25/2019 0403   HgbA1c:  Lab Results  Component Value Date   HGBA1C 5.7 (H) 02/25/2019   Urine Drug Screen:     Component Value Date/Time   LABOPIA NONE DETECTED 02/23/2019 1957   COCAINSCRNUR NONE DETECTED 02/23/2019 1957   LABBENZ NONE DETECTED 02/23/2019 1957   AMPHETMU NONE DETECTED 02/23/2019 1957   THCU NONE DETECTED 02/23/2019 1957   LABBARB NONE DETECTED 02/23/2019 1957    Alcohol Level     Component Value Date/Time   ETH <10 01/28/2019 1554   IMAGING Vas [place] Transcranial Doppler W Bubbles  Result Date: 02/25/2019  Transcranial Doppler with Bubble Indications: Stroke. Performing Technologist: Jeb Levering RDMS, RVT  Examination Guidelines: A complete evaluation  includes B-mode imaging, spectral Doppler, color Doppler, and power Doppler as needed of all accessible portions of each vessel. Bilateral testing is considered an integral part of a complete examination. Limited examinations for reoccurring indications may be performed as noted.  Summary:  A vascular evaluation was performed. The right middle cerebral artery was studied. An IV was inserted into the patient's left forearm. Verbal informed consent was obtained.  No HITS at rest or during Valsalva. No apparent PFO. Negative TCD Bubble study *See table(s) above for measurements and observations.  Diagnosing physician: Delia Heady MD Electronically signed by Delia Heady MD on 02/25/2019 at 5:07:19 PM.    Final     PHYSICAL EXAM:  Pleasant middle-age male currently not in distress. . Afebrile. Head is nontraumatic. Neck is supple without bruit.    Cardiac exam no murmur or gallop. Lungs are clear to auscultation. Distal pulses are well felt. Neurological Exam ;  Awake  Alert oriented x 3. Normal speech and language.eye movements full without nystagmus.  Mild saccadic dysmetria on right lateral gaze.  Fundi were not visualized. Vision acuity  and fields appear normal. Hearing is significantly diminished bilaterally.  Palatal movements are normal. Face symmetric. Tongue midline. Normal strength, tone, reflexes and coordination. Normal sensation. Gait deferred.   ASSESSMENT/PLAN Cody Porter is a 58 y.o. male with history of HTN, AF, DVT, PE on Eliquis and tobacco abuse who developed vomiting and dizziness at home w/ HA, presenting to ED with syncope. CT showed hmg, reversed w/ Andexxa.  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 Unclear if primary ICH vs infarct with hemorrhagic transformation  CT head R & L cerebellar hemorrhages w/ edema. Small L external capsule hmg. Small vessel disease.   CTA head &  neck no LVO. Stable hmg B cerebellar and L external capsule  CT head stable B cerebellar hmg and subacute L external capsule hmg. Stable edema and mass effect.  2D Echo 01/29/2019 EF 60-65%. No source of embolus   MRI w/w/o unchanged B cerebellar hmgs R >L. uncahged L BG hmg. Mild mass effect posterior fossa w/o hydro, no shift. Mild Small vessel disease. Atrophy.   TCD w/ bubble no HITS, no PFO  LDL 157  HgbA1c 5.7  Check hypercoagulable labs in future (recent Andexxa)  SCDs for VTE prophylaxis  Eliquis (apixaban) daily prior to admission, now on No antithrombotic given hmg. Add aspirin 81 mg daily at d/c.  Therapy recommendations:  CIR  Disposition:  pending - hope for d/c to CIR Tomorrow  Cerebral Edema Induced Hypernatremia  Placed on 3% protocol via PIV, now off  Na 136  Atrial Fibrillation  Home anticoagulation:  Eliquis (apixaban) daily   Home meds:  Metoprolol 25 bid  Not an AC candidate d/t hmg  B LE DVT PE  B LE DVTs per doppler 01/28/2019 on Eliquis  PE 01/2019 on Eliquis  IVC Filter placed 02/25/19  Hypertensive Emergency  BP as high as 195/86  Home meds:  Metoprolol 25 bid  Remains on cleviprex  Add norvasc 5 mg daily . Increase SBP goal < 160 . Long-term BP goal normotensive  Other Stroke Risk Factors  Cigarette smoker. advised to stop smoking  ETOH use, hx ETOH abuse, advised to drink no more than 2 drink(s) a day  Hx marijuana use, UDS neg this admission  Family hx stroke  ? Heart failure    Other Active Problems  Fall at home prior to admission  Hypokalemia, resolved 3.5  Hospital day # 4 Continue mobilize out of bed.  Therapy consults.  Transfer to inpatient rehab over the next few days.  Will start aspirin 81 mg daily at discharge.   Delia HeadyPramod Angelena Sand, MD  To contact Stroke Continuity provider, please refer to [email or web address]. After hours, contact General Neurology

## 2019-02-28 DIAGNOSIS — G936 Cerebral edema: Secondary | ICD-10-CM | POA: Diagnosis present

## 2019-02-28 DIAGNOSIS — Z823 Family history of stroke: Secondary | ICD-10-CM

## 2019-02-28 DIAGNOSIS — I4891 Unspecified atrial fibrillation: Secondary | ICD-10-CM | POA: Diagnosis present

## 2019-02-28 DIAGNOSIS — F17210 Nicotine dependence, cigarettes, uncomplicated: Secondary | ICD-10-CM | POA: Diagnosis present

## 2019-02-28 LAB — BASIC METABOLIC PANEL
Anion gap: 14 (ref 5–15)
BUN: 12 mg/dL (ref 6–20)
CO2: 21 mmol/L — ABNORMAL LOW (ref 22–32)
Calcium: 9.3 mg/dL (ref 8.9–10.3)
Chloride: 101 mmol/L (ref 98–111)
Creatinine, Ser: 0.96 mg/dL (ref 0.61–1.24)
GFR calc Af Amer: >60 mL/min (ref >60)
GFR calc non Af Amer: >60 mL/min (ref >60)
Glucose, Bld: 111 mg/dL — ABNORMAL HIGH (ref 70–99)
Potassium: 3.8 mmol/L (ref 3.5–5.1)
Sodium: 136 mmol/L (ref 135–145)

## 2019-02-28 LAB — CBC
HCT: 50 % (ref 39.0–52.0)
Hemoglobin: 16.3 g/dL (ref 13.0–17.0)
MCH: 31.1 pg (ref 26.0–34.0)
MCHC: 32.6 g/dL (ref 30.0–36.0)
MCV: 95.4 fL (ref 80.0–100.0)
Platelets: 297 10*3/uL (ref 150–400)
RBC: 5.24 MIL/uL (ref 4.22–5.81)
RDW: 12.4 % (ref 11.5–15.5)
WBC: 6.5 10*3/uL (ref 4.0–10.5)
nRBC: 0 % (ref 0.0–0.2)

## 2019-02-28 MED ORDER — LABETALOL HCL 5 MG/ML IV SOLN: 20.0000 mg | Freq: Three times a day (TID) | INTRAVENOUS | Status: DC | PRN

## 2019-02-28 MED ORDER — ASPIRIN EC 81 MG PO TBEC
81.0000 mg | DELAYED_RELEASE_TABLET | Freq: Every day | ORAL | Status: DC
  Administered 2019-02-28 – 2019-03-01 (×2): 81 mg via ORAL
  Filled 2019-02-28 (×2): qty 1

## 2019-02-28 MED ORDER — HYDRALAZINE HCL 20 MG/ML IJ SOLN: 20.0000 mg | Freq: Three times a day (TID) | INTRAMUSCULAR | Status: DC | PRN

## 2019-02-28 NOTE — Plan of Care (Signed)

## 2019-02-28 NOTE — Progress Notes (Addendum)
Physical Therapy Treatment Patient Details Name: Cody Porter MRN: [id number] DOB: November 09, 1961 Today's Date: 02/28/2019    History of Present Illness Pt is a 58 y.o. male with past medical history of PE diagnosed in March, DVT, paroxysmal atrial fibrillation, alcohol abuse, who presented to the emergency room for evaluation of dizziness. CT of the head revealed acute hemorrhage within the bilateral cerebellar hemispheres and subacute hemorrhage within the left external capsule. MRI showed unchanged size of intraparenchymal hematomas within both cerebellar hemispheres, right greater than left. S/p IVC filter placement 4/14     PT Comments    Pt with worsening cognition and confusion today as compared to previous session yesterday. Upon arrival, pt getting to EOB and attempting to stand with the bed alarm going off and all bed rails up. Pt had taken off his tele leads and there was blood found in the bed. When asked about this, pt was unsure of where the blood had come from. Pt's RN was notified. Pt also consistently saying that he is going home today despite being agreeable to CIR when discussed yesterday. Pt would continue to benefit from skilled physical therapy services at this time while admitted and after d/c to address the below listed limitations in order to improve overall safety and independence with functional mobility.    Follow Up Recommendations  CIR     Equipment Recommendations  None recommended by PT    Recommendations for Other Services       Precautions / Restrictions Precautions Precautions: Fall Restrictions Weight Bearing Restrictions: No    Mobility  Bed Mobility               General bed mobility comments: pt achieving sitting EOB upon arrival with bed alarm going off and all bed rails up  Transfers Overall transfer level: Needs assistance Equipment used: None Transfers: Sit to/from Stand Sit to Stand: Mod assist         General transfer  comment: mod A for stability and safety due to impulsiveness; pt also with LOB towards his R side upon standing  Ambulation/Gait Ambulation/Gait assistance: Mod assist;Min assist Gait Distance (Feet): 30 Feet Assistive device: None Gait Pattern/deviations: Ataxic;Decreased stride length;Step-through pattern;Drifts right/left;Staggering right;Staggering left Gait velocity: decreased   General Gait Details: pt with modest instability requiring min-mod A to maintain upright balance; pt with significant LOB towards his R side requiring mod A to maintain   Stairs             Wheelchair Mobility    Modified Rankin (Stroke Patients Only) Modified Rankin (Stroke Patients Only) Pre-Morbid Rankin Score: No symptoms Modified Rankin: Moderately severe disability     Balance Overall balance assessment: Needs assistance;History of Falls Sitting-balance support: Feet supported Sitting balance-Leahy Scale: Fair     Standing balance support: During functional activity Standing balance-Leahy Scale: Poor                              Cognition Arousal/Alertness: Awake/alert Behavior During Therapy: Flat affect;Impulsive Overall Cognitive Status: Impaired/Different from baseline Area of Impairment: Memory;Following commands;Safety/judgement;Awareness;Problem solving                     Memory: Decreased short-term memory Following Commands: Follows one step commands inconsistently Safety/Judgement: Decreased awareness of safety;Decreased awareness of deficits Awareness: Intellectual Problem Solving: Slow processing;Difficulty sequencing;Requires verbal cues        Exercises      General Comments  Pertinent Vitals/Pain Pain Assessment: No/denies pain    Home Living                      Prior Function            PT Goals (current goals can now be found in the care plan section) Acute Rehab PT Goals PT Goal Formulation: With  patient Time For Goal Achievement: 03/11/19 Potential to Achieve Goals: Good Progress towards PT goals: Progressing toward goals    Frequency    Min 4X/week      PT Plan Current plan remains appropriate    Co-evaluation              AM-PAC PT "6 Clicks" Mobility   Outcome Measure  Help needed turning from your back to your side while in a flat bed without using bedrails?: None Help needed moving from lying on your back to sitting on the side of a flat bed without using bedrails?: None Help needed moving to and from a bed to a chair (including a wheelchair)?: A Lot Help needed standing up from a chair using your arms (e.g., wheelchair or bedside chair)?: A Lot Help needed to walk in hospital room?: A Lot Help needed climbing 3-5 steps with a railing? : A Lot 6 Click Score: 16    End of Session         PT Visit Diagnosis: Unsteadiness on feet (R26.81)     Time: 0093-8182 PT Time Calculation (min) (ACUTE ONLY): 12 min  Charges:  $Gait Training: 8-22 mins                     Deborah Chalk, [place], DPT  Acute Rehabilitation Services Pager [phone] Office [phone]     Cody Porter 02/28/2019, 9:38 AM

## 2019-02-28 NOTE — Progress Notes (Signed)
  Speech Language Pathology Treatment: Cognitive-Linquistic  Patient Details Name: Cody Porter MRN: [id number] DOB: Dec 08, 1960 Today's Date: 02/28/2019 Time: 5883-2549 SLP Time Calculation (min) (ACUTE ONLY): 24 min  Assessment / Plan / Recommendation Clinical Impression  Pt was seen for cognitive-linguistic treatment and indicated that he had just "put [himself] back to bed because no one was coming". He further expressed that "the thing (referring to the alarm) went off.Marland KitchenMarland KitchenI didn't know it would do that". Pt verbalized understanding regarding his fall risk, the hospital's fall prevention measures, and  his risk of injury from a potential fall but he stated, "well, I had to get back to bed". He provided 3-6 items per abstract category with an average of 4 items per category. He achieved 40% accuracy with complex reasoning tasks  increasing to 100% accuracy with moderate cues. With three-word mental manipulation ranking task he demonstrated 60% accuracy increasing to 100% accuracy with min cues. He once more demonstrated 60% accuracy with recall of concrete information from voicemails. SLP will continue to follow pt.    HPI HPI: Pt is a 58 y.o. male with past medical history of PE diagnosed in March, DVT, paroxysmal atrial fibrillation, alcohol abuse, who presented to the emergency room for evaluation of dizziness. CT of the head revealed acute hemorrhage within the bilateral cerebellar hemispheres and subacute hemorrhage within the left external capsule. MRI showed unchanged size of intraparenchymal hematomas within both cerebellar hemispheres, right greater than left.      SLP Plan  Continue with current plan of care       Recommendations                   Follow up Recommendations: 24 hour supervision/assistance;Inpatient Rehab SLP Visit Diagnosis: Cognitive communication deficit (I26.415) Plan: Continue with current plan of care       Cody Toth I. Vear Clock, MS, CCC-SLP Acute  Rehabilitation Services Office number [phone] Pager [phone]               Scheryl Marten 02/28/2019, 2:24 PM

## 2019-02-28 NOTE — Progress Notes (Signed)
STROKE TEAM PROGRESS NOTE   INTERVAL HISTORY Patient remains neurologically stable.  No complaints.  He is likely approved for transfer to rehab tomorrow  Vitals:   02/27/19 1538 02/27/19 2307 02/28/19 0451 02/28/19 0908  BP: (!) 155/95 (!) 178/99 (!) 154/81 (!) 179/90  Pulse: 78 79 76 70  Resp: 20 18 19 16   Temp: 97.9 F (36.6 C) 97.8 F (36.6 C) 97.8 F (36.6 C) (!) 97.5 F (36.4 C)  TempSrc: Oral Oral Oral Oral  SpO2: 96% 97% 96% 98%  Weight:      Height:        CBC:  Recent Labs  Lab 02/23/19 1639  02/27/19 0614 02/28/19 0846  WBC 7.3   < > 6.0 6.5  NEUTROABS 5.6  --   --   --   HGB 15.3   < > 15.5 16.3  HCT 47.3   < > 47.1 50.0  MCV 98.3   < > 94.4 95.4  PLT 286   < > 300 297   < > = values in this interval not displayed.    Basic Metabolic Panel:  Recent Labs  Lab 02/27/19 0614 02/28/19 0846  NA 137 136  K 3.5 3.8  CL 101 101  CO2 22 21*  GLUCOSE 103* 111*  BUN 13 12  CREATININE 0.90 0.96  CALCIUM 9.3 9.3   Lipid Panel:     Component Value Date/Time   CHOL 249 (H) 02/25/2019 0403   TRIG 225 (H) 02/25/2019 0403   HDL 47 02/25/2019 0403   CHOLHDL 5.3 02/25/2019 0403   VLDL 45 (H) 02/25/2019 0403   LDLCALC 157 (H) 02/25/2019 0403   HgbA1c:  Lab Results  Component Value Date   HGBA1C 5.7 (H) 02/25/2019   Urine Drug Screen:     Component Value Date/Time   LABOPIA NONE DETECTED 02/23/2019 1957   COCAINSCRNUR NONE DETECTED 02/23/2019 1957   LABBENZ NONE DETECTED 02/23/2019 1957   AMPHETMU NONE DETECTED 02/23/2019 1957   THCU NONE DETECTED 02/23/2019 1957   LABBARB NONE DETECTED 02/23/2019 1957    Alcohol Level     Component Value Date/Time   ETH <10 01/28/2019 1554   IMAGING No results found.  PHYSICAL EXAM:   Pleasant middle-age male currently not in distress. . Afebrile. Head is nontraumatic. Neck is supple without bruit.    Cardiac exam no murmur or gallop. Lungs are clear to auscultation. Distal pulses are well felt. Neurological  Exam ;  Awake  Alert oriented x 3. Normal speech and language.eye movements full without nystagmus.  Mild saccadic dysmetria on right lateral gaze.  Fundi were not visualized. Vision acuity and fields appear normal. Hearing is significantly diminished bilaterally.  Palatal movements are normal. Face symmetric. Tongue midline. Normal strength, tone, reflexes and coordination. Normal sensation. Gait deferred.   ASSESSMENT/PLAN Mr. JORGE ZAINO is a 58 y.o. male with history of HTN, AF, DVT, PE on Eliquis and tobacco abuse who developed vomiting and dizziness at home w/ HA, presenting to ED with syncope. CT showed hmg, reversed w/ Andexxa.  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 Unclear if primary ICH vs infarct with hemorrhagic transformation  CT head R & L cerebellar hemorrhages w/ edema. Small L external capsule hmg. Small vessel disease.   CTA head & neck no LVO. Stable hmg B cerebellar and L external capsule  CT head stable B cerebellar hmg and subacute L  external capsule hmg. Stable edema and mass effect.  2D Echo 01/29/2019 EF 60-65%. No source of embolus   MRI w/w/o unchanged B cerebellar hmgs R >L. uncahged L BG hmg. Mild mass effect posterior fossa w/o hydro, no shift. Mild Small vessel disease. Atrophy.   TCD w/ bubble no HITS, no PFO  LDL 157  HgbA1c 5.7  Check hypercoagulable labs in future (recent Andexxa)  SCDs for VTE prophylaxis  Eliquis (apixaban) daily prior to admission, now on No antithrombotic given hmg. Add aspirin 81 mg daily  Therapy recommendations:  CIR  Disposition:  pending - BED AVAILBLE ON CIR Tomorrow - D/C SUMMARY STARTED  D/c tele  Cerebral Edema Induced Hypernatremia  Placed on 3% protocol via PIV, now off  Na 136  Atrial Fibrillation  Home anticoagulation:  Eliquis (apixaban) daily   Home meds:  Metoprolol 25 bid  Not an AC candidate  d/t hmg  OK TO ADD ASPIRIN DAILY  B LE DVT PE  B LE DVTs per doppler 01/28/2019 on Eliquis  PE 01/2019 on Eliquis  IVC Filter placed 02/25/19  Hypertensive Emergency  stable  Home meds:  Metoprolol 25 bid  Off cleviprex  on norvasc 5 mg daily and metoprolol 25 bid . SBP goal < 180 . Long-term BP goal normotensive  Other Stroke Risk Factors  Cigarette smoker. advised to stop smoking  ETOH use, hx ETOH abuse, advised to drink no more than 2 drink(s) a day  Hx marijuana use, UDS neg this admission  Family hx stroke  ? Heart failure    Other Active Problems  Fall at home prior to admission  Hypokalemia, resolved 3.5  Hospital day # 5  Start aspirin 81 mg daily given prior history of pulmonary embolism 5 days since intracerebral.  Therapies.  Transfer to inpatient rehab when bed available normal.  Discussed with rehab team physician assistant greater than 50% time during this 25-minute visit was spent in counseling and coordination of care and discussion with care team   Delia HeadyPramod Adelena Desantiago, MD  To contact Stroke Continuity provider, please refer to [email or web address]. After hours, contact General Neurology

## 2019-02-28 NOTE — Progress Notes (Addendum)
Inpatient Rehabilitation Admissions Coordinator  I met with patient at bedside to discuss our recommendation for an inpt rehab admit. We discussed that he had tried to leave last night and today, but that we did not feel that was in his best interest. He then agreed to plan admit to inpt rehab/CIR Saturday. I spoke with pt's Mom by phone and she is also in agreement. I have alerted Burnetta Sabin, [facility] as well as RN CM , Vida Roller of plan.  Dr. Naaman Plummer will see patient tomorrow for final approval to admit to CIR. [facility] RN can contact CIR charge Nurse at [phone] at [facility] Saturday to arrange admit. Please call me with any questions.  Danne Baxter, RN, MSN Rehab Admissions Coordinator [phone] 02/28/2019 10:59 AM

## 2019-02-28 NOTE — H&P (Cosign Needed)
Physical Medicine and Rehabilitation Admission H&P    Chief Complaint  Patient presents with  . Emesis  . Dizziness  chief complaint:dizziness  HPI: Cody Porter is a 58 year old right-handed male with history of pulmonary emboli diagnosed in March started on Eliquis as well as history of PAF, alcohol abuse and tobacco use. Patient lives with elderly mother. Two-level home bedroom bath upstairs. Reportedly independent prior to admission and still working. Presented for 11/02/2019 with dizziness/headache, nausea and vomiting.blood pressure was systolic in the 180s. Placed on Cleviprex for blood pressure control. Cranial CT scan showed 2 foci of acute hemorrhage within the cerebellum, largest measuring 3 cm greatest dimension. Associated edema at both sites of the hemorrhage causing local mass effect with sulcal effacement. No midline shift or evidence of herniation. Eliquis was reversed with Andexxa. Urine drug screen negative. Hyperosmolar therapy 3% saline initiated.Most recent echocardiogram with ejection fraction of 65% normal systolic function. In regards to recent pulmonary emboli and eliquis discontinued due to hemorrhage. Interventional radiology consulted and underwent IVC placement 02/25/2019. Patient is tolerating a regular diet. Close monitoring of blood pressure. Therapy evaluations completed and patient was admitted for a comprehensive rehabilitation program.  Review of Systems  Constitutional: Negative for chills and fever.  HENT: Negative for hearing loss.   Eyes: Negative for blurred vision and double vision.  Respiratory: Positive for shortness of breath. Negative for cough.   Cardiovascular: Negative for chest pain, palpitations and leg swelling.  Gastrointestinal: Positive for nausea and vomiting.  Genitourinary: Negative for dysuria, flank pain and hematuria.  Musculoskeletal: Positive for myalgias.  Skin: Negative for rash.  Neurological: Positive for dizziness and  headaches. Negative for seizures.  Psychiatric/Behavioral: The patient has insomnia.   All other systems reviewed and are negative.  Past Medical History:  Diagnosis Date  . ETOH abuse 01/28/2019  . Leg DVT (deep venous thromboembolism), acute, bilateral (HCC) 01/28/2019  . Pulmonary embolus (HCC) 01/28/2019   Past Surgical History:  Procedure Laterality Date  . HERNIA REPAIR    . IR IVC FILTER PLMT / S&I /IMG GUID/MOD SED  02/25/2019  . TONSILLECTOMY     Family History  Family history unknown: Yes   Social History:  reports that he has been smoking cigarettes. He has been smoking about 0.75 packs per day. He has never used smokeless tobacco. He reports previous alcohol use of about 14.0 standard drinks of alcohol per week. He reports previous drug use. Drug: Marijuana. Allergies: No Known Allergies Medications Prior to Admission  Medication Sig Dispense Refill  . metoprolol tartrate (LOPRESSOR) 25 MG tablet Take 1 tablet (25 mg total) by mouth 2 (two) times daily for 30 days. 60 tablet 1  . apixaban (ELIQUIS) 5 MG TABS tablet Take 2 tablets (10 mg total) by mouth 2 (two) times daily for 13 days. 52 tablet 0    Drug Regimen Review Drug regimen was reviewed and remains appropriate with no significant issues identified  Home: Home Living Family/patient expects to be discharged to:: Private residence Living Arrangements: Parent Available Help at Discharge: Family, Available 24 hours/day Type of Home: Apartment Home Access: Stairs to enter [employer] of Steps: 1 to 3 Entrance Stairs-Rails: None Home Layout: Two level, 1/2 bath on main level, Bed/bath upstairs, Able to live on main level with bedroom/bathroom Alternate Level Stairs-Number of Steps: reports mother sleeps on first floor usuually, Alternate Level Stairs-Rails: Right, Left Bathroom Shower/Tub: Tub/shower unit, Curtain Bathroom Toilet: Standard Bathroom Accessibility: Yes Home Equipment: None  Lives  With:  Other (Comment)   Functional History: Prior Function Level of Independence: Independent Comments: walks 2 miles a day  Functional Status:  Mobility: Bed Mobility Overal bed mobility: Needs Assistance Bed Mobility: Supine to Sit Supine to sit: Supervision General bed mobility comments: pt achieving sitting EOB upon arrival with bed alarm going off and all bed rails up Transfers Overall transfer level: Needs assistance Equipment used: None Transfers: Sit to/from Stand Sit to Stand: Mod assist General transfer comment: mod A for stability and safety due to impulsiveness; pt also with LOB towards his R side upon standing Ambulation/Gait Ambulation/Gait assistance: Mod assist, Min assist Gait Distance (Feet): 30 Feet Assistive device: None Gait Pattern/deviations: Ataxic, Decreased stride length, Step-through pattern, Drifts right/left, Staggering right, Staggering left General Gait Details: pt with modest instability requiring min-mod A to maintain upright balance; pt with significant LOB towards his R side requiring mod A to maintain Gait velocity: decreased Gait velocity interpretation: <1.8 ft/sec, indicate of risk for recurrent falls    ADL: ADL Overall ADL's : Needs assistance/impaired Eating/Feeding: Set up, Sitting Grooming: Wash/dry hands, Wash/dry face, Brushing hair, Standing, Moderate assistance Grooming Details (indicate cue type and reason): verbal cues  Upper Body Bathing: Maximal assistance, Sitting Lower Body Bathing: Maximal assistance, Sit to/from stand Upper Body Dressing : Total assistance, Sitting Lower Body Dressing: Moderate assistance, Sit to/from stand Lower Body Dressing Details (indicate cue type and reason): increased effort to access feet, and assist for balance  Toilet Transfer: Minimal assistance, +2 for physical assistance, +2 for safety/equipment, Ambulation, Comfort height toilet, Grab bars Toileting- Clothing Manipulation and Hygiene: Moderate  assistance, Sit to/from stand Functional mobility during ADLs: Minimal assistance, +2 for physical assistance, +2 for safety/equipment General ADL Comments: Pt required max cues to problem solve and sequence how to spnge bathe.  He initially picke up a folded bath towel, wet it with cold water, then rubbed the folded edge lightly over his chest and indicated he was done.  He reuquired max cues to use appropropriate wash cloths, to apply soap, and to wash thoroughly.  He was highly distracted by his lines   Cognition: Cognition Overall Cognitive Status: Impaired/Different from baseline Arousal/Alertness: Awake/alert Orientation Level: Disoriented to time(confused in coversation) Attention: Focused, Sustained Focused Attention: Impaired Focused Attention Impairment: Verbal complex(Vigilance impaired: 0/1) Sustained Attention: Impaired Sustained Attention Impairment: Verbal complex(Serial 7s: 0/3) Memory: Impaired Memory Impairment: Retrieval deficit, Decreased short term memory Decreased Short Term Memory: Verbal complex(Immediate memory: 5/5; Delayed: 5/5) Awareness: Impaired Awareness Impairment: Intellectual impairment Executive Function: Reasoning, Sequencing Reasoning: Impaired Reasoning Impairment: Verbal complex(Asbtraction: 1/2) Sequencing: Impaired Sequencing Impairment: Verbal complex(Difficulty drawing clock: 1/3) Cognition Arousal/Alertness: Awake/alert Behavior During Therapy: Flat affect, Impulsive Overall Cognitive Status: Impaired/Different from baseline Area of Impairment: Memory, Following commands, Safety/judgement, Awareness, Problem solving Current Attention Level: Sustained Memory: Decreased short-term memory Following Commands: Follows one step commands inconsistently Safety/Judgement: Decreased awareness of safety, Decreased awareness of deficits Awareness: Intellectual Problem Solving: Slow processing, Difficulty sequencing, Requires verbal cues General  Comments: Pt A&Ox3  Physical Exam: Blood pressure (!) 150/91, pulse 69, temperature 97.6 F (36.4 C), temperature source Oral, resp. rate 19, height 5\' 8"  (1.727 m), weight 78.1 kg, SpO2 97 %. Physical Exam  Constitutional: No distress.  HENT:  Head: Normocephalic and atraumatic.  Eyes: Pupils are equal, round, and reactive to light. Conjunctivae are normal.  Neck: Normal range of motion. No tracheal deviation present. No thyromegaly present.  Cardiovascular: Normal rate.  No murmur heard. Respiratory: Effort normal. No respiratory distress.  He has no wheezes.  GI: Soft. He exhibits no distension. There is no abdominal tenderness.  Musculoskeletal: Normal range of motion.        General: No edema.  Neurological:  Sitting up in chair. Mood is a bit flat but appropriate. He does follow simple commands. Provides his name and age. Reasonable insight and awareness. Normal language. Speech clear. No nystagmus.  Decreased fine motor coordination bilaterally with finger to nose testing as well as heel to toe. Strength grossly 4/5 all 4's. Normal LT and pain sense.   Skin: Skin is warm and dry. He is not diaphoretic.  Psychiatric: He has a normal mood and affect. His behavior is normal.    Results for orders placed or performed during the hospital encounter of 02/23/19 (from the past 48 hour(s))  CBC     Status: None   Collection Time: 02/27/19  6:14 AM  Result Value Ref Range   WBC 6.0 4.0 - 10.5 K/uL   RBC 4.99 4.22 - 5.81 MIL/uL   Hemoglobin 15.5 13.0 - 17.0 g/dL   HCT 20.2 33.4 - 35.6 %   MCV 94.4 80.0 - 100.0 fL   MCH 31.1 26.0 - 34.0 pg   MCHC 32.9 30.0 - 36.0 g/dL   RDW 86.1 68.3 - 72.9 %   Platelets 300 150 - 400 K/uL   nRBC 0.0 0.0 - 0.2 %    Comment: Performed at [facility], [address] [place], [place] [address]  Basic metabolic panel     Status: Abnormal   Collection Time: 02/27/19  6:14 AM  Result Value Ref Range   Sodium 137 135 - 145 mmol/L   Potassium 3.5  3.5 - 5.1 mmol/L   Chloride 101 98 - 111 mmol/L   CO2 22 22 - 32 mmol/L   Glucose, Bld 103 (H) 70 - 99 mg/dL   BUN 13 6 - 20 mg/dL   Creatinine, Ser 5.52 0.61 - 1.24 mg/dL   Calcium 9.3 8.9 - 08.0 mg/dL   GFR calc non Af Amer >60 >60 mL/min   GFR calc Af Amer >60 >60 mL/min   Anion gap 14 5 - 15    Comment: Performed at [facility], [address], [place], [place] [address]  CBC     Status: None   Collection Time: 02/28/19  8:46 AM  Result Value Ref Range   WBC 6.5 4.0 - 10.5 K/uL   RBC 5.24 4.22 - 5.81 MIL/uL   Hemoglobin 16.3 13.0 - 17.0 g/dL   HCT 12.2 44.9 - 75.3 %   MCV 95.4 80.0 - 100.0 fL   MCH 31.1 26.0 - 34.0 pg   MCHC 32.6 30.0 - 36.0 g/dL   RDW 00.5 11.0 - 21.1 %   Platelets 297 150 - 400 K/uL   nRBC 0.0 0.0 - 0.2 %    Comment: Performed at [facility], [address] [place], [place] [address]  Basic metabolic panel     Status: Abnormal   Collection Time: 02/28/19  8:46 AM  Result Value Ref Range   Sodium 136 135 - 145 mmol/L   Potassium 3.8 3.5 - 5.1 mmol/L   Chloride 101 98 - 111 mmol/L   CO2 21 (L) 22 - 32 mmol/L   Glucose, Bld 111 (H) 70 - 99 mg/dL   BUN 12 6 - 20 mg/dL   Creatinine, Ser 7.01 0.61 - 1.24 mg/dL   Calcium 9.3 8.9 - 41.0 mg/dL  GFR calc non Af Amer >60 >60 mL/min   GFR calc Af Amer >60 >60 mL/min   Anion gap 14 5 - 15    Comment: Performed at [facility], [address], [place], [place] [address]   No results found.     Medical Problem List and Plan: 1.   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  Antithrombotics: -DVT/anticoagulation/history of pulmonary emboli: Status Post IVC filter 02/25/2019   -antiplatelet therapy: N/A 3. Pain Management:  Tylenol as needed 4. Mood: provide emotional support   -antipsychotic agents: N/A 5. Neuropsych: This patient is capable of making decisions on his own behalf. 6. Skin/Wound Care:  Routine skin checks 7. Fluids/Electrolytes/Nutrition:  Routine ins and outs with follow-up chemistries 8. Hypertension. Cleviprex discontinued. Norvasc 5 mg daily, Lopressor 25 mg twice a day. Monitor with increased mobility 9. PAF: Cardiac rate controlled. Continue Lopressor 10. History of alcohol tobacco abuse. Provide counseling       Charlton Amor, PA-C 02/28/2019

## 2019-03-01 ENCOUNTER — Other Ambulatory Visit: Payer: Self-pay

## 2019-03-01 ENCOUNTER — Inpatient Hospital Stay (HOSPITAL_COMMUNITY)
Admission: EM | Admit: 2019-03-01 | Discharge: 2019-03-07 | DRG: 093 | Disposition: A | Discharge status: Home / Self Care | Payer: Self-pay | Source: Intra-hospital | Attending: Physical Medicine & Rehabilitation | Admitting: Physical Medicine & Rehabilitation

## 2019-03-01 ENCOUNTER — Encounter (HOSPITAL_COMMUNITY): Payer: Self-pay

## 2019-03-01 DIAGNOSIS — R2689 Other abnormalities of gait and mobility: Principal | ICD-10-CM | POA: Diagnosis present

## 2019-03-01 DIAGNOSIS — F1010 Alcohol abuse, uncomplicated: Secondary | ICD-10-CM | POA: Diagnosis present

## 2019-03-01 DIAGNOSIS — Z7901 Long term (current) use of anticoagulants: Secondary | ICD-10-CM

## 2019-03-01 DIAGNOSIS — Z7141 Alcohol abuse counseling and surveillance of alcoholic: Secondary | ICD-10-CM

## 2019-03-01 DIAGNOSIS — I614 Nontraumatic intracerebral hemorrhage in cerebellum: Secondary | ICD-10-CM

## 2019-03-01 DIAGNOSIS — Z86711 Personal history of pulmonary embolism: Secondary | ICD-10-CM

## 2019-03-01 DIAGNOSIS — R110 Nausea: Secondary | ICD-10-CM | POA: Diagnosis not present

## 2019-03-01 DIAGNOSIS — K219 Gastro-esophageal reflux disease without esophagitis: Secondary | ICD-10-CM | POA: Diagnosis present

## 2019-03-01 DIAGNOSIS — Z95828 Presence of other vascular implants and grafts: Secondary | ICD-10-CM

## 2019-03-01 DIAGNOSIS — I480 Paroxysmal atrial fibrillation: Secondary | ICD-10-CM | POA: Diagnosis present

## 2019-03-01 DIAGNOSIS — I619 Nontraumatic intracerebral hemorrhage, unspecified: Secondary | ICD-10-CM | POA: Diagnosis present

## 2019-03-01 DIAGNOSIS — Z86718 Personal history of other venous thrombosis and embolism: Secondary | ICD-10-CM

## 2019-03-01 DIAGNOSIS — Z716 Tobacco abuse counseling: Secondary | ICD-10-CM

## 2019-03-01 DIAGNOSIS — I4891 Unspecified atrial fibrillation: Secondary | ICD-10-CM

## 2019-03-01 DIAGNOSIS — I10 Essential (primary) hypertension: Secondary | ICD-10-CM | POA: Diagnosis present

## 2019-03-01 DIAGNOSIS — F17210 Nicotine dependence, cigarettes, uncomplicated: Secondary | ICD-10-CM | POA: Diagnosis present

## 2019-03-01 DIAGNOSIS — Z79899 Other long term (current) drug therapy: Secondary | ICD-10-CM

## 2019-03-01 LAB — CBC
HCT: 46.7 % (ref 39.0–52.0)
Hemoglobin: 15.8 g/dL (ref 13.0–17.0)
MCH: 32.2 pg (ref 26.0–34.0)
MCHC: 33.8 g/dL (ref 30.0–36.0)
MCV: 95.3 fL (ref 80.0–100.0)
Platelets: 294 10*3/uL (ref 150–400)
RBC: 4.9 MIL/uL (ref 4.22–5.81)
RDW: 12.5 % (ref 11.5–15.5)
WBC: 7.5 10*3/uL (ref 4.0–10.5)
nRBC: 0 % (ref 0.0–0.2)

## 2019-03-01 LAB — BASIC METABOLIC PANEL
Anion gap: 12 (ref 5–15)
BUN: 12 mg/dL (ref 6–20)
CO2: 24 mmol/L (ref 22–32)
Calcium: 9.5 mg/dL (ref 8.9–10.3)
Chloride: 100 mmol/L (ref 98–111)
Creatinine, Ser: 0.96 mg/dL (ref 0.61–1.24)
GFR calc Af Amer: >60 mL/min (ref >60)
GFR calc non Af Amer: >60 mL/min (ref >60)
Glucose, Bld: 103 mg/dL — ABNORMAL HIGH (ref 70–99)
Potassium: 3.8 mmol/L (ref 3.5–5.1)
Sodium: 136 mmol/L (ref 135–145)

## 2019-03-01 MED ORDER — HYDRALAZINE HCL 20 MG/ML IJ SOLN: 20.0000 mg | Freq: Three times a day (TID) | INTRAMUSCULAR | Status: DC | PRN

## 2019-03-01 MED ORDER — METOPROLOL TARTRATE 25 MG PO TABS
25.0000 mg | ORAL_TABLET | Freq: Two times a day (BID) | ORAL | Status: DC
  Administered 2019-03-01 – 2019-03-07 (×12): 25 mg via ORAL
  Filled 2019-03-01 (×12): qty 1

## 2019-03-01 MED ORDER — ASPIRIN EC 81 MG PO TBEC
81.0000 mg | DELAYED_RELEASE_TABLET | Freq: Every day | ORAL | Status: DC
  Administered 2019-03-02 – 2019-03-07 (×6): 81 mg via ORAL
  Filled 2019-03-01 (×6): qty 1

## 2019-03-01 MED ORDER — LABETALOL HCL 5 MG/ML IV SOLN
20.0000 mg | Freq: Three times a day (TID) | INTRAVENOUS | Status: DC | PRN
  Filled 2019-03-01: qty 4

## 2019-03-01 MED ORDER — ACETAMINOPHEN 650 MG RE SUPP: 650.0000 mg | RECTAL | Status: DC | PRN

## 2019-03-01 MED ORDER — AMLODIPINE BESYLATE 5 MG PO TABS
5.0000 mg | ORAL_TABLET | Freq: Every day | ORAL | Status: DC
  Administered 2019-03-02 – 2019-03-04 (×3): 5 mg via ORAL
  Filled 2019-03-01 (×3): qty 1

## 2019-03-01 MED ORDER — ACETAMINOPHEN 325 MG PO TABS
650.0000 mg | ORAL_TABLET | ORAL | Status: DC | PRN
  Administered 2019-03-01 – 2019-03-07 (×8): 650 mg via ORAL
  Filled 2019-03-01 (×8): qty 2

## 2019-03-01 MED ORDER — SENNOSIDES-DOCUSATE SODIUM 8.6-50 MG PO TABS
1.0000 | ORAL_TABLET | Freq: Two times a day (BID) | ORAL | Status: DC
  Administered 2019-03-01 – 2019-03-07 (×12): 1 via ORAL
  Filled 2019-03-01 (×12): qty 1

## 2019-03-01 MED ORDER — ACETAMINOPHEN 160 MG/5ML PO SOLN: 650.0000 mg | ORAL | Status: DC | PRN

## 2019-03-01 NOTE — Progress Notes (Signed)
Patient admitted to 4W10. Patient arrived to [facility] via bed accompanied by RN. Patient is alert and oriented x4. Skin assessment rendered x 2 RN's. Excoriation noted to elbows, BUE and Buttocks secondary to scratching. Barrier cream initiated to Buttocks. Patient has clothing and wallet containing a Radiation protection practitioner. Patient oriented to unit safety specific protocols.

## 2019-03-01 NOTE — H&P (Signed)
Physical Medicine and Rehabilitation Admission H&P        Chief Complaint  Patient presents with  . Emesis  . Dizziness  chief complaint:dizziness   HPI: Cody Porter is a 58 year old right-handed male with history of pulmonary emboli diagnosed in March started on Eliquis as well as history of PAF, alcohol abuse and tobacco use. Patient lives with elderly mother. Two-level home bedroom bath upstairs. Reportedly independent prior to admission and still working. Presented for 11/02/2019 with dizziness/headache, nausea and vomiting.blood pressure was systolic in the 180s. Placed on Cleviprex for blood pressure control. Cranial CT scan showed 2 foci of acute hemorrhage within the cerebellum, largest measuring 3 cm greatest dimension. Associated edema at both sites of the hemorrhage causing local mass effect with sulcal effacement. No midline shift or evidence of herniation. Eliquis was reversed with Andexxa. Urine drug screen negative. Hyperosmolar therapy 3% saline initiated.Most recent echocardiogram with ejection fraction of 65% normal systolic function. In regards to recent pulmonary emboli and eliquis discontinued due to hemorrhage. Interventional radiology consulted and underwent IVC placement 02/25/2019. Patient is tolerating a regular diet. Close monitoring of blood pressure. Therapy evaluations completed and patient was admitted for a comprehensive rehabilitation program.   Review of Systems  Constitutional: Negative for chills and fever.  HENT: Negative for hearing loss.   Eyes: Negative for blurred vision and double vision.  Respiratory: Positive for shortness of breath. Negative for cough.   Cardiovascular: Negative for chest pain, palpitations and leg swelling.  Gastrointestinal: Positive for nausea and vomiting.  Genitourinary: Negative for dysuria, flank pain and hematuria.  Musculoskeletal: Positive for myalgias.  Skin: Negative for rash.  Neurological: Positive for  dizziness and headaches. Negative for seizures.  Psychiatric/Behavioral: The patient has insomnia.   All other systems reviewed and are negative.       Past Medical History:  Diagnosis Date  . ETOH abuse 01/28/2019  . Leg DVT (deep venous thromboembolism), acute, bilateral (HCC) 01/28/2019  . Pulmonary embolus (HCC) 01/28/2019         Past Surgical History:  Procedure Laterality Date  . HERNIA REPAIR      . IR IVC FILTER PLMT / S&I /IMG GUID/MOD SED   02/25/2019  . TONSILLECTOMY        Family History  Family history unknown: Yes    Social History:  reports that he has been smoking cigarettes. He has been smoking about 0.75 packs per day. He has never used smokeless tobacco. He reports previous alcohol use of about 14.0 standard drinks of alcohol per week. He reports previous drug use. Drug: Marijuana. Allergies: No Known Allergies       Medications Prior to Admission  Medication Sig Dispense Refill  . metoprolol tartrate (LOPRESSOR) 25 MG tablet Take 1 tablet (25 mg total) by mouth 2 (two) times daily for 30 days. 60 tablet 1  . apixaban (ELIQUIS) 5 MG TABS tablet Take 2 tablets (10 mg total) by mouth 2 (two) times daily for 13 days. 52 tablet 0      Drug Regimen Review Drug regimen was reviewed and remains appropriate with no significant issues identified   Home: Home Living Family/patient expects to be discharged to:: Private residence Living Arrangements: Parent Available Help at Discharge: Family, Available 24 hours/day Type of Home: Apartment Home Access: Stairs to enter [employer] of Steps: 1 to 3 Entrance Stairs-Rails: None Home Layout: Two level, 1/2 bath on main level, Bed/bath upstairs, Able to live on main level  with bedroom/bathroom Alternate Level Stairs-Number of Steps: reports mother sleeps on first floor usuually, Alternate Level Stairs-Rails: Right, Left Bathroom Shower/Tub: Tub/shower unit, Engineer, building servicesCurtain Bathroom Toilet: Standard Bathroom  Accessibility: Yes Home Equipment: None  Lives With: Other (Comment)   Functional History: Prior Function Level of Independence: Independent Comments: walks 2 miles a day   Functional Status:  Mobility: Bed Mobility Overal bed mobility: Needs Assistance Bed Mobility: Supine to Sit Supine to sit: Supervision General bed mobility comments: pt achieving sitting EOB upon arrival with bed alarm going off and all bed rails up Transfers Overall transfer level: Needs assistance Equipment used: None Transfers: Sit to/from Stand Sit to Stand: Mod assist General transfer comment: mod A for stability and safety due to impulsiveness; pt also with LOB towards his R side upon standing Ambulation/Gait Ambulation/Gait assistance: Mod assist, Min assist Gait Distance (Feet): 30 Feet Assistive device: None Gait Pattern/deviations: Ataxic, Decreased stride length, Step-through pattern, Drifts right/left, Staggering right, Staggering left General Gait Details: pt with modest instability requiring min-mod A to maintain upright balance; pt with significant LOB towards his R side requiring mod A to maintain Gait velocity: decreased Gait velocity interpretation: <1.8 ft/sec, indicate of risk for recurrent falls   ADL: ADL Overall ADL's : Needs assistance/impaired Eating/Feeding: Set up, Sitting Grooming: Wash/dry hands, Wash/dry face, Brushing hair, Standing, Moderate assistance Grooming Details (indicate cue type and reason): verbal cues  Upper Body Bathing: Maximal assistance, Sitting Lower Body Bathing: Maximal assistance, Sit to/from stand Upper Body Dressing : Total assistance, Sitting Lower Body Dressing: Moderate assistance, Sit to/from stand Lower Body Dressing Details (indicate cue type and reason): increased effort to access feet, and assist for balance  Toilet Transfer: Minimal assistance, +2 for physical assistance, +2 for safety/equipment, Ambulation, Comfort height toilet, Grab bars  Toileting- Clothing Manipulation and Hygiene: Moderate assistance, Sit to/from stand Functional mobility during ADLs: Minimal assistance, +2 for physical assistance, +2 for safety/equipment General ADL Comments: Pt required max cues to problem solve and sequence how to spnge bathe.  He initially picke up a folded bath towel, wet it with cold water, then rubbed the folded edge lightly over his chest and indicated he was done.  He reuquired max cues to use appropropriate wash cloths, to apply soap, and to wash thoroughly.  He was highly distracted by his lines    Cognition: Cognition Overall Cognitive Status: Impaired/Different from baseline Arousal/Alertness: Awake/alert Orientation Level: Disoriented to time(confused in coversation) Attention: Focused, Sustained Focused Attention: Impaired Focused Attention Impairment: Verbal complex(Vigilance impaired: 0/1) Sustained Attention: Impaired Sustained Attention Impairment: Verbal complex(Serial 7s: 0/3) Memory: Impaired Memory Impairment: Retrieval deficit, Decreased short term memory Decreased Short Term Memory: Verbal complex(Immediate memory: 5/5; Delayed: 5/5) Awareness: Impaired Awareness Impairment: Intellectual impairment Executive Function: Reasoning, Sequencing Reasoning: Impaired Reasoning Impairment: Verbal complex(Asbtraction: 1/2) Sequencing: Impaired Sequencing Impairment: Verbal complex(Difficulty drawing clock: 1/3) Cognition Arousal/Alertness: Awake/alert Behavior During Therapy: Flat affect, Impulsive Overall Cognitive Status: Impaired/Different from baseline Area of Impairment: Memory, Following commands, Safety/judgement, Awareness, Problem solving Current Attention Level: Sustained Memory: Decreased short-term memory Following Commands: Follows one step commands inconsistently Safety/Judgement: Decreased awareness of safety, Decreased awareness of deficits Awareness: Intellectual Problem Solving: Slow processing,  Difficulty sequencing, Requires verbal cues General Comments: Pt A&Ox3   Physical Exam: Blood pressure (!) 150/91, pulse 69, temperature 97.6 F (36.4 C), temperature source Oral, resp. rate 19, height 5\' 8"  (1.727 m), weight 78.1 kg, SpO2 97 %. Physical Exam  Constitutional: No distress.  HENT:  Head: Normocephalic and atraumatic.  Eyes: Pupils  are equal, round, and reactive to light. Conjunctivae are normal.  Neck: Normal range of motion. No tracheal deviation present. No thyromegaly present.  Cardiovascular: Normal rate.  No murmur heard. Respiratory: Effort normal. No respiratory distress. He has no wheezes.  GI: Soft. He exhibits no distension. There is no abdominal tenderness.  Musculoskeletal: Normal range of motion.        General: No edema.  Neurological:  Sitting up in chair. Mood is a bit flat but appropriate. He does follow simple commands. Provides his name and age. Reasonable insight and awareness. Normal language. Speech clear. No nystagmus.  Decreased fine motor coordination bilaterally with finger to nose testing as well as heel to toe. Strength grossly 4/5 all 4's. Normal LT and pain sense.   Skin: Skin is warm and dry. He is not diaphoretic.  Psychiatric: He has a normal mood and affect. His behavior is normal.      Lab Results Last 48 Hours        Results for orders placed or performed during the hospital encounter of 02/23/19 (from the past 48 hour(s))  CBC     Status: None    Collection Time: 02/27/19  6:14 AM  Result Value Ref Range    WBC 6.0 4.0 - 10.5 K/uL    RBC 4.99 4.22 - 5.81 MIL/uL    Hemoglobin 15.5 13.0 - 17.0 g/dL    HCT 16.1 09.6 - 04.5 %    MCV 94.4 80.0 - 100.0 fL    MCH 31.1 26.0 - 34.0 pg    MCHC 32.9 30.0 - 36.0 g/dL    RDW 40.9 81.1 - 91.4 %    Platelets 300 150 - 400 K/uL    nRBC 0.0 0.0 - 0.2 %      Comment: Performed at [facility], [address] [place], [place] [address]  Basic metabolic panel     Status: Abnormal     Collection Time: 02/27/19  6:14 AM  Result Value Ref Range    Sodium 137 135 - 145 mmol/L    Potassium 3.5 3.5 - 5.1 mmol/L    Chloride 101 98 - 111 mmol/L    CO2 22 22 - 32 mmol/L    Glucose, Bld 103 (H) 70 - 99 mg/dL    BUN 13 6 - 20 mg/dL    Creatinine, Ser 6.21 0.61 - 1.24 mg/dL    Calcium 9.3 8.9 - 30.8 mg/dL    GFR calc non Af Amer >60 >60 mL/min    GFR calc Af Amer >60 >60 mL/min    Anion gap 14 5 - 15      Comment: Performed at [facility], [address] [place], [place] [address]  CBC     Status: None    Collection Time: 02/28/19  8:46 AM  Result Value Ref Range    WBC 6.5 4.0 - 10.5 K/uL    RBC 5.24 4.22 - 5.81 MIL/uL    Hemoglobin 16.3 13.0 - 17.0 g/dL    HCT 69.6 29.5 - 28.4 %    MCV 95.4 80.0 - 100.0 fL    MCH 31.1 26.0 - 34.0 pg    MCHC 32.6 30.0 - 36.0 g/dL    RDW 13.2 44.0 - 10.2 %    Platelets 297 150 - 400 K/uL    nRBC 0.0 0.0 - 0.2 %      Comment: Performed at [facility], [address] [place], [place] [address]  Basic metabolic panel     Status: Abnormal    Collection Time: 02/28/19  8:46 AM  Result Value Ref Range    Sodium 136 135 - 145 mmol/L    Potassium 3.8 3.5 - 5.1 mmol/L    Chloride 101 98 - 111 mmol/L    CO2 21 (L) 22 - 32 mmol/L    Glucose, Bld 111 (H) 70 - 99 mg/dL    BUN 12 6 - 20 mg/dL    Creatinine, Ser 1.61 0.61 - 1.24 mg/dL    Calcium 9.3 8.9 - 09.6 mg/dL    GFR calc non Af Amer >60 >60 mL/min    GFR calc Af Amer >60 >60 mL/min    Anion gap 14 5 - 15      Comment: Performed at [facility], [address] [place], [place] [address]      Imaging Results (Last 48 hours)  No results found.           Medical Problem List and Plan: 1.   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   Antithrombotics: -DVT/anticoagulation/history of pulmonary emboli: Status Post IVC filter 02/25/2019              -antiplatelet therapy: N/A 3. Pain Management:  Tylenol as needed 4. Mood: provide emotional support             -antipsychotic agents: N/A 5. Neuropsych: This patient is capable of making decisions on his own behalf. 6. Skin/Wound Care:  Routine skin checks 7. Fluids/Electrolytes/Nutrition:  Routine ins and outs with follow-up chemistries 8. Hypertension. Cleviprex discontinued. Norvasc 5 mg daily, Lopressor 25 mg twice a day. Monitor with increased mobility 9. PAF: Cardiac rate controlled. Continue Lopressor 10. History of alcohol tobacco abuse. Provide counseling        Post Admission Physician Evaluation: 1. Functional deficits secondary  to bilateral cerebellar hemorhages. 2. Patient is admitted to receive collaborative, interdisciplinary care between the physiatrist, rehab nursing staff, and therapy team. 3. Patient's level of medical complexity and substantial therapy needs in context of that medical necessity cannot be provided at a lesser intensity of care such as a SNF. 4. Patient has experienced substantial functional loss from his/her baseline which was documented above under the "Functional History" and "Functional Status" headings.  Judging by the patient's diagnosis, physical exam, and functional history, the patient has potential for functional progress which will result in measurable gains while on inpatient rehab.  These gains will be of substantial and practical use upon discharge  in facilitating mobility and self-care at the household level. 5. Physiatrist will provide 24 hour management of medical needs as well as oversight of the therapy plan/treatment and provide guidance as appropriate regarding the interaction of the two. 6. The Preadmission Screening has been reviewed and patient status is unchanged unless otherwise stated above. 7. 24 hour rehab nursing  will assist with bladder management, bowel management, safety, skin/wound care, disease management, medication administration, pain management and patient education  and help integrate therapy concepts, techniques,education, etc. 8. PT will assess and treat for/with: Lower extremity strength, range of motion, stamina, balance, functional mobility, safety, adaptive techniques and equipment, NMR.   Goals are: mod I to supervision. 9. OT will assess and treat for/with: ADL's,  functional mobility, safety, upper extremity strength, adaptive techniques and equipment, NMR, family ed.   Goals are: mod I to supervisin. Therapy may proceed with showering this patient. 10. SLP will assess and treat for/with: speech, comunication.  Goals are: mod I to supervision. 11. Case Management and Social Worker will assess and treat for psychological issues and discharge planning. 12. Team conference will be held weekly to assess progress toward goals and to determine barriers to discharge. 13. Patient will receive at least 3 hours of therapy per day at least 5 days per week. 14. ELOS: 10-14 days       15. Prognosis:  excellent   I have personally performed a face to face diagnostic evaluation of this patient and formulated the key components of the plan.  Additionally, I have personally reviewed laboratory data, imaging studies, as well as relevant notes and concur with the physician assistant's documentation above.  Ranelle Oyster, MD, FAAPMR     Mcarthur Rossetti Angiulli, PA-C 02/28/2019

## 2019-03-01 NOTE — Plan of Care (Signed)
  Problem: Consults Goal: RH STROKE PATIENT EDUCATION Description See Patient Education module for education specifics  Outcome: Progressing   Problem: RH SKIN INTEGRITY Goal: RH STG MAINTAIN SKIN INTEGRITY WITH ASSISTANCE Description STG Maintain Skin Integrity With Assistance. Outcome: Progressing   Problem: RH SAFETY Goal: RH STG ADHERE TO SAFETY PRECAUTIONS W/ASSISTANCE/DEVICE Description STG Adhere to Safety Precautions With Assistance/Device. Outcome: Progressing   Problem: RH COGNITION-NURSING Goal: RH STG ANTICIPATES NEEDS/CALLS FOR ASSIST W/ASSIST/CUES Description STG Anticipates Needs/Calls for Assist With Assistance/Cues. Outcome: Progressing   Problem: RH PAIN MANAGEMENT Goal: RH STG PAIN MANAGED AT OR BELOW PT'S PAIN GOAL Outcome: Not Progressing

## 2019-03-01 NOTE — Progress Notes (Signed)
Meredith Staggers, MD  Physician  Physical Medicine and Rehabilitation  PMR Pre-admission  Signed  Date of Service:  02/27/2019 3:07 PM       Related encounter: ED to Hosp-Admission (Discharged) from 02/23/2019 in [facility]      Signed         Show:Clear all _0 Manual_1 Template_2 Copied  Added by: _3 Cristina Gong, RN_4 Meredith Staggers, MD  _5 Hover for details PMR Admission Coordinator Pre-Admission Assessment  Patient: Cody Porter is an 58 y.o., male MRN: [id number] DOB: 28-Jan-1961 Height: _6  (172.7 cm) Weight: 78.1 kg  Insurance Information  PRIMARY: uninsured       Development worker, community Lynwood Dawley [phone]. Mom states she is applying for medicaid and disability through Development worker, community. I left Voicemail for [facility] on 4/16 to clarify if this was indeed happening.  Medicaid Application Date:       Case Manager:  Disability Application Date:       Case Worker:   The Data Collection Information Summary for patients in Inpatient Rehabilitation Facilities with attached Privacy Act [place] Records was provided and verbally reviewed with: N/A  Emergency Contact Information         Contact Information    Name Relation Home Work Mobile   Matherly,[name] Mother   [phone]      Current Medical History  Patient Admitting Diagnosis: B Cerebellar hemorrhage  History of Present Illness: 58 year old male with past medical history of PE diagnosed 01/2019 started on Eliquis, DVT, paroxysmal atrial fibrillation, alcohol abuse, presented to ED to ED for dizziness on 02/23/2019. CT showed bilateral cerebellar bleeds with no hydrocephalus or midline shift. Etiology felt likely hypertension in the setting of coagulopathy from Eliquis. Andexxa given for reversal.  2 d echo 01/29/2019 EF 60 to 65%. No source of embolus. MRI unchanged B cerebellar hemorrhages R>L. TCD w/ bubble no HITS, No PFO. LDL 157 Hgb A1c 5.7. To  check hypercoagulable labs in future ( recent [place]). SCDs for VTE prophylaxis. Eliquis on hold.   Initially placed on 3% protocol , now off. Metoprolol on home regimen for atrial fibrillation. IVC filter placed on 02/25/2019.Initially on Cleviprex, added Norvasc. Will plan start ASA 81 mg at time of discharge.    Complete NIHSS TOTAL: 0  Patient's medical [facility] from [facility]  has been reviewed by the rehabilitation admission coordinator and physician.  Past Medical History      Past Medical History:  Diagnosis Date   ETOH abuse 01/28/2019   Leg DVT (deep venous thromboembolism), acute, bilateral ([place]) 01/28/2019   Pulmonary embolus ([place]) 01/28/2019    Family History   Family history is unknown by patient.  Prior Rehab/Hospitalizations Has the patient had prior rehab or hospitalizations prior to admission? Yes  Has the patient had major surgery during 100 days prior to admission? No              Current Medications  Current Facility-Administered Medications:    acetaminophen (TYLENOL) tablet 650 mg, 650 mg, Oral, Q4H PRN, 650 mg at 02/28/19 0830 **OR** acetaminophen (TYLENOL) solution 650 mg, 650 mg, Per Tube, Q4H PRN **OR** acetaminophen (TYLENOL) suppository 650 mg, 650 mg, Rectal, Q4H PRN, Miachel Roux, Sharon L, NP   amLODipine (NORVASC) tablet 5 mg, 5 mg, Oral, Daily, Biby, Sharon L, NP, 5 mg at 02/28/19 0830   aspirin EC tablet 81 mg, 81 mg, Oral, Daily, Biby, Sharon L, NP   hydrALAZINE (APRESOLINE) injection 20 mg, 20 mg, Intravenous, Q8H  PRN, Donzetta Starch, NP   labetalol (NORMODYNE) injection 20 mg, 20 mg, Intravenous, Q8H PRN, Burnetta Sabin L, NP   metoprolol tartrate (LOPRESSOR) tablet 25 mg, 25 mg, Oral, BID, Biby, Sharon L, NP, 25 mg at 02/28/19 0830   senna-docusate (Senokot-S) tablet 1 tablet, 1 tablet, Oral, BID, Donzetta Starch, NP, 1 tablet at 02/28/19 0830  Patients Current Diet:     Diet Order                  Diet Heart Room  service appropriate? Yes with Assist; Fluid consistency: Thin  Diet effective now               Precautions / Restrictions Precautions Precautions: Fall Restrictions Weight Bearing Restrictions: No   Has the patient had 2 or more falls or a fall with injury in the past year? No  Prior Activity Level Community (5-7x/wk): walks daily; does not drive, has license but no car  Prior Functional Level Self Care: Did the patient need help bathing, dressing, using the toilet or eating? Independent  Indoor Mobility: Did the patient need assistance with walking from room to room (with or without device)? Independent  Stairs: Did the patient need assistance with internal or external stairs (with or without device)? Independent  Functional Cognition: Did the patient need help planning regular tasks such as shopping or remembering to take medications? Needed some help  Home Assistive Devices / Equipment Home Equipment: None  Prior Device Use: Indicate devices/aids used by the patient prior to current illness, exacerbation or injury? None of the above  Current Functional Level Cognition  Arousal/Alertness: Awake/alert Overall Cognitive Status: Impaired/Different from baseline Current Attention Level: Sustained Orientation Level: Disoriented to time Following Commands: Follows one step commands inconsistently Safety/Judgement: Decreased awareness of safety, Decreased awareness of deficits General Comments: Pt A&Ox3 Attention: Focused, Sustained Focused Attention: Impaired Focused Attention Impairment: Verbal complex(Vigilance impaired: 0/1) Sustained Attention: Impaired Sustained Attention Impairment: Verbal complex(Serial 7s: 0/3) Memory: Impaired Memory Impairment: Retrieval deficit, Decreased short term memory Decreased Short Term Memory: Verbal complex(Immediate memory: 5/5; Delayed: 5/5) Awareness: Impaired Awareness Impairment: Intellectual impairment Executive  Function: Reasoning, Sequencing Reasoning: Impaired Reasoning Impairment: Verbal complex(Asbtraction: 1/2) Sequencing: Impaired Sequencing Impairment: Verbal complex(Difficulty drawing clock: 1/3)    Extremity Assessment (includes Sensation/Coordination)  Upper Extremity Assessment: Generalized weakness, LUE deficits/detail, RUE deficits/detail RUE Deficits / Details: bil. UEs tremulous  RUE Coordination: decreased fine motor, decreased gross motor LUE Deficits / Details: bil. UEs tremulous  LUE Coordination: decreased fine motor, decreased gross motor  Lower Extremity Assessment: Generalized weakness    ADLs  Overall ADL's : Needs assistance/impaired Eating/Feeding: Set up, Sitting Grooming: Wash/dry hands, Wash/dry face, Brushing hair, Standing, Moderate assistance Grooming Details (indicate cue type and reason): verbal cues  Upper Body Bathing: Maximal assistance, Sitting Lower Body Bathing: Maximal assistance, Sit to/from stand Upper Body Dressing : Total assistance, Sitting Lower Body Dressing: Moderate assistance, Sit to/from stand Lower Body Dressing Details (indicate cue type and reason): increased effort to access feet, and assist for balance  Toilet Transfer: Minimal assistance, +2 for physical assistance, +2 for safety/equipment, Ambulation, Comfort height toilet, Grab bars Toileting- Clothing Manipulation and Hygiene: Moderate assistance, Sit to/from stand Functional mobility during ADLs: Minimal assistance, +2 for physical assistance, +2 for safety/equipment General ADL Comments: Pt required max cues to problem solve and sequence how to spnge bathe.  He initially picke up a folded bath towel, wet it with cold water, then rubbed the folded edge lightly over  his chest and indicated he was done.  He reuquired max cues to use appropropriate wash cloths, to apply soap, and to wash thoroughly.  He was highly distracted by his lines     Mobility  Overal bed mobility: Needs  Assistance Bed Mobility: Supine to Sit Supine to sit: Supervision General bed mobility comments: pt achieving sitting EOB upon arrival with bed alarm going off and all bed rails up    Transfers  Overall transfer level: Needs assistance Equipment used: None Transfers: Sit to/from Stand Sit to Stand: Mod assist General transfer comment: mod A for stability and safety due to impulsiveness; pt also with LOB towards his R side upon standing    Ambulation / Gait / Stairs / Wheelchair Mobility  Ambulation/Gait Ambulation/Gait assistance: Mod assist, Min assist Gait Distance (Feet): 30 Feet Assistive device: None Gait Pattern/deviations: Ataxic, Decreased stride length, Step-through pattern, Drifts right/left, Staggering right, Staggering left General Gait Details: pt with modest instability requiring min-mod A to maintain upright balance; pt with significant LOB towards his R side requiring mod A to maintain Gait velocity: decreased Gait velocity interpretation: <1.8 ft/sec, indicate of risk for recurrent falls    Posture / Balance Balance Overall balance assessment: Needs assistance, History of Falls Sitting-balance support: Feet supported Sitting balance-Leahy Scale: Fair Standing balance support: During functional activity Standing balance-Leahy Scale: Poor Standing balance comment: some intability in standing during task performance    Special needs/care consideration BiPAP/CPAP  N/a CPM  N/a Continuous Drip IV  N/a Dialysis n/a Life Vest  N/a Oxygen  N/a Special Bed  N/a Trach Size  N/a Wound Vac n/a Skin  Dressing to anterior right side of neck             Bowel mgmt:  Continent LBM 02/26/2019 Bladder mgmt:  continent Diabetic mgmt:  Hgb A1c 5.7 Behavioral consideration  N/a Chemo/radiation  N/a Patient did drink before his admit to [facility] 01/2019. Mom states he has not drinked since home. Patient wanted to go home [place] 4/16 and 4/17, but after speaking with his Mom, MD,  staff and myself is aware of the need to remain for his rehab recovery   Previous Home Environment  Living Arrangements: Parent  Lives With: Other (Comment) Available Help at Discharge: Family, Available 24 hours/day Type of Home: Apartment Home Layout: Two level, 1/2 bath on main level, Bed/bath upstairs, Able to live on main level with bedroom/bathroom Alternate Level Stairs-Rails: Right, Left Alternate Level Stairs-Number of Steps: reports mother sleeps on first floor usuually, Home Access: Stairs to enter Entrance Stairs-Rails: None Entrance Stairs-Number of Steps: 1 to 3 Bathroom Shower/Tub: Tub/shower unit, Architectural technologist: Standard Bathroom Accessibility: Yes How Accessible: Accessible via walker [place]: No  Discharge Living Setting Plans for Discharge Living Setting: Lives with (comment)(Mom) Type of Home at Discharge: Apartment Discharge Home Layout: Two level, 1/2 bath on main level, Bed/bath upstairs, Able to live on main level with bedroom/bathroom Alternate Level Stairs-Number of Steps: 13 Discharge Home Access: Stairs to enter Entrance Stairs-Rails: None Entrance Stairs-Number of Steps: 1 to 3 Discharge Bathroom Shower/Tub: Tub/shower unit, Curtain Discharge Bathroom Toilet: Standard Discharge Bathroom Accessibility: Yes How Accessible: Accessible via walker Does the patient have any problems obtaining your medications?: Yes (Describe)  Social/Family/Support Systems Contact Information: Mom, Delora Fuel Anticipated Caregiver: Mom Anticipated Caregiver's Contact Information: [phone] Ability/Limitations of Caregiver: no limitations, 58 year old Caregiver Availability: 24/7 Discharge Plan Discussed with Primary Caregiver: Yes Is Caregiver In Agreement with Plan?: Yes Does  Caregiver/Family have Issues with Lodging/Transportation while Pt is in Rehab?: No  Goals/Additional Needs Patient/Family Goal for Rehab: Mod I to supervision  PT, OT, and SLP Expected length of stay: ELOS 10 to 14 days Pt/Family Agrees to Admission and willing to participate: Yes Program Orientation Provided & Reviewed with Pt/Caregiver Including Roles  & Responsibilities: Yes  Barriers to Discharge: Insurance for SNF coverage  Decrease burden of Care through IP rehab admission: n/a  Possible need for SNF placement upon discharge:  Not anticipated Patient Condition: I have reviewed medical records from [facility] , spoken with CM, and patient and family member, Mom. I met with patient at the bedside for inpatient rehabilitation assessment.  Patient will benefit from ongoing PT, OT and SLP, can actively participate in 3 hours of therapy a day 5 days of the week, and can make measurable gains during the admission.  Patient will also benefit from the coordinated team approach during an Inpatient Acute Rehabilitation admission.  The patient will receive intensive therapy as well as Rehabilitation physician, nursing, social worker, and care management interventions.  Due to safety, disease management, medication administration and patient education the patient requires 24 hour a day rehabilitation nursing.  The patient is currently mod assist with mobility and basic ADLs.  Discharge setting and therapy post discharge at home with home health is anticipated.  Patient has agreed to participate in the Acute Inpatient Rehabilitation Program and will admit Saturday, 4/18 when bed is available.  Preadmission Screen Completed By:  Cleatrice Burke RN MSN, 02/28/2019 2:42 PM ______________________________________________________________________   Discussed status with Dr. Naaman Plummer  on 02/28/2019 at 1249 and received approval for admission Saturday 03/01/2019 when bed is available.  Admission Coordinator:  Cleatrice Burke, RN MSN, time  1249 Date  02/28/2019   Assessment/Plan: Diagnosis: bilateral cerebellar hemorrhages 1. Does the need for  close, 24 hr/day Medical supervision in concert with the patient's rehab needs make it unreasonable for this patient to be served in a less intensive setting? Yes 2. Co-Morbidities requiring supervision/potential complications: hx of recent PE, HTN, a fib 3. Due to bladder management, bowel management, safety, skin/wound care, disease management, medication administration, pain management and patient education, does the patient require 24 hr/day rehab nursing? Yes 4. Does the patient require coordinated care of a physician, rehab nurse, PT (1-2 hrs/day, 5 days/week), OT (1-2 hrs/day, 5 days/week) and SLP (1-2 hrs/day, 5 days/week) to address physical and functional deficits in the context of the above medical diagnosis(es)? Yes Addressing deficits in the following areas: balance, endurance, locomotion, strength, transferring, bowel/bladder control, bathing, dressing, feeding, grooming, toileting, cognition, speech and psychosocial support 5. Can the patient actively participate in an intensive therapy program of at least 3 hrs of therapy 5 days a week? Yes 6. The potential for patient to make measurable gains while on inpatient rehab is excellent 7. Anticipated functional outcomes upon discharge from inpatients are: modified independent and supervision PT, modified independent and supervision OT, modified independent and supervision SLP 8. Estimated rehab length of stay to reach the above functional goals is: 10-14 days 9. Anticipated D/C setting: Home 10. Anticipated post D/C treatments: [place] therapy 11. Overall Rehab/Functional Prognosis: excellent  MD Signature: Meredith Staggers, MD, [facility] 03/01/2019         Revision History

## 2019-03-01 NOTE — Progress Notes (Signed)
STROKE TEAM PROGRESS NOTE   INTERVAL HISTORY Patient remains neurologically stable.  No complaints.  He is likely approved for transfer to rehab today  Vitals:   02/28/19 1826 02/28/19 2312 03/01/19 0315 03/01/19 0954  BP: 134/76 (!) 141/91 (!) 143/93 (!) 156/89  Pulse: 86 82 80 79  Resp: 18 18 18 18   Temp: (!) 97.5 F (36.4 C) (!) 97.5 F (36.4 C) 97.7 F (36.5 C) 97.6 F (36.4 C)  TempSrc: Oral Oral Oral Oral  SpO2: 97% 91% 95% 96%  Weight:      Height:        CBC:  Recent Labs  Lab 02/23/19 1639  02/28/19 0846 03/01/19 0440  WBC 7.3   < > 6.5 7.5  NEUTROABS 5.6  --   --   --   HGB 15.3   < > 16.3 15.8  HCT 47.3   < > 50.0 46.7  MCV 98.3   < > 95.4 95.3  PLT 286   < > 297 294   < > = values in this interval not displayed.    Basic Metabolic Panel:  Recent Labs  Lab 02/28/19 0846 03/01/19 0440  NA 136 136  K 3.8 3.8  CL 101 100  CO2 21* 24  GLUCOSE 111* 103*  BUN 12 12  CREATININE 0.96 0.96  CALCIUM 9.3 9.5   Lipid Panel:     Component Value Date/Time   CHOL 249 (H) 02/25/2019 0403   TRIG 225 (H) 02/25/2019 0403   HDL 47 02/25/2019 0403   CHOLHDL 5.3 02/25/2019 0403   VLDL 45 (H) 02/25/2019 0403   LDLCALC 157 (H) 02/25/2019 0403   HgbA1c:  Lab Results  Component Value Date   HGBA1C 5.7 (H) 02/25/2019   Urine Drug Screen:     Component Value Date/Time   LABOPIA NONE DETECTED 02/23/2019 1957   COCAINSCRNUR NONE DETECTED 02/23/2019 1957   LABBENZ NONE DETECTED 02/23/2019 1957   AMPHETMU NONE DETECTED 02/23/2019 1957   THCU NONE DETECTED 02/23/2019 1957   LABBARB NONE DETECTED 02/23/2019 1957    Alcohol Level     Component Value Date/Time   ETH <10 01/28/2019 1554   IMAGING No results found.  PHYSICAL EXAM:   Pleasant middle-age male currently not in distress. . Afebrile. Head is nontraumatic. Neck is supple without bruit.    Cardiac exam no murmur or gallop. Lungs are clear to auscultation. Distal pulses are well felt. Neurological  Exam ;  Awake  Alert oriented x 3. Normal speech and language.eye movements full without nystagmus.  Mild saccadic dysmetria on right lateral gaze.  Fundi were not visualized. Vision acuity and fields appear normal. Hearing is significantly diminished bilaterally.  Palatal movements are normal. Face symmetric. Tongue midline. Normal strength, tone, reflexes and coordination. Normal sensation. Gait deferred.   ASSESSMENT/PLAN Mr. Astrid DivineDamon C Fiallo is a 58 y.o. male with history of HTN, AF, DVT, PE on Eliquis and tobacco abuse who developed vomiting and dizziness at home w/ HA, presenting to ED with syncope. CT showed hmg, reversed w/ Andexxa.  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 Unclear if primary ICH vs infarct with hemorrhagic transformation  CT head R & L cerebellar hemorrhages w/ edema. Small L external capsule hmg. Small vessel disease.   CTA head & neck no LVO. Stable hmg B cerebellar and L external capsule  CT head stable B cerebellar hmg and subacute L  external capsule hmg. Stable edema and mass effect.  2D Echo 01/29/2019 EF 60-65%. No source of embolus   MRI w/w/o unchanged B cerebellar hmgs R >L. uncahged L BG hmg. Mild mass effect posterior fossa w/o hydro, no shift. Mild Small vessel disease. Atrophy.   TCD w/ bubble no HITS, no PFO  LDL 157  HgbA1c 5.7  Check hypercoagulable labs in future (recent Andexxa)  SCDs for VTE prophylaxis  Eliquis (apixaban) daily prior to admission, now on No antithrombotic given hmg. Add aspirin 81 mg daily  Therapy recommendations:  CIR  Disposition:  pending - BED AVAILBLE ON CIR Tomorrow - D/C SUMMARY STARTED  D/c tele  Cerebral Edema Induced Hypernatremia  Placed on 3% protocol via PIV, now off  Na 136  Atrial Fibrillation  Home anticoagulation:  Eliquis (apixaban) daily   Home meds:  Metoprolol 25 bid  Not an AC candidate  d/t hmg  OK TO ADD ASPIRIN DAILY  B LE DVT PE  B LE DVTs per doppler 01/28/2019 on Eliquis  PE 01/2019 on Eliquis  IVC Filter placed 02/25/19  Hypertensive Emergency  stable  Home meds:  Metoprolol 25 bid  Off cleviprex  on norvasc 5 mg daily and metoprolol 25 bid . SBP goal < 180 . Long-term BP goal normotensive  Other Stroke Risk Factors  Cigarette smoker. advised to stop smoking  ETOH use, hx ETOH abuse, advised to drink no more than 2 drink(s) a day  Hx marijuana use, UDS neg this admission  Family hx stroke  ? Heart failure    Other Active Problems  Fall at home prior to admission  Hypokalemia, resolved 3.5  Hospital day # 6  Continue aspirin 81 mg daily given prior history of pulmonary embolism 5 days since intracerebral.  Therapies.  Transfer to inpatient rehab when bed available   Delia Heady, MD  To contact Stroke Continuity provider, please refer to [email or web address]. After hours, contact General Neurology

## 2019-03-01 NOTE — Discharge Summary (Addendum)
Patient ID: Cody Porter   MRN: [id number]      DOB: 1961/06/11  Date of Admission: 02/23/2019 Date of Discharge: 03/01/2019  Attending Physician:  Micki Riley, MD, Stroke MD Consultant(s):   Cyril Mourning, MD ( pulmonary/intensive care ), Richarda Overlie, MD (radiology) Patient's PCP:  Patient, No Pcp Per  DISCHARGE DIAGNOSIS:  Principal Problem:   ICH (intracerebral hemorrhage) (HCC) B cerebellar and L external capsule hmg while on Eliquis Active Problems:   ETOH abuse   Leg DVT (deep venous thromboembolism), acute, bilateral (HCC)   Acute pulmonary embolism (HCC)   Cerebral edema (HCC)   Atrial fibrillation (HCC)   Cigarette smoker   Family history of stroke  Past Medical History:  Diagnosis Date  . ETOH abuse 01/28/2019  . Leg DVT (deep venous thromboembolism), acute, bilateral (HCC) 01/28/2019  . Pulmonary embolus (HCC) 01/28/2019   Past Surgical History:  Procedure Laterality Date  . HERNIA REPAIR    . IR IVC FILTER PLMT / S&I /IMG GUID/MOD SED  02/25/2019  . TONSILLECTOMY      HOSPITAL MEDICATIONS . amLODipine  5 mg Oral Daily  . aspirin EC  81 mg Oral Daily  . metoprolol tartrate  25 mg Oral BID  . senna-docusate  1 tablet Oral BID    HOME MEDICATIONS PRIOR TO ADMISSION Medications Prior to Admission  Medication Sig Dispense Refill  . metoprolol tartrate (LOPRESSOR) 25 MG tablet Take 1 tablet (25 mg total) by mouth 2 (two) times daily for 30 days. 60 tablet 1  . apixaban (ELIQUIS) 5 MG TABS tablet Take 2 tablets (10 mg total) by mouth 2 (two) times daily for 13 days. 52 tablet 0    LABORATORY STUDIES CBC    Component Value Date/Time   WBC 7.5 03/01/2019 0440   RBC 4.90 03/01/2019 0440   HGB 15.8 03/01/2019 0440   HCT 46.7 03/01/2019 0440   PLT 294 03/01/2019 0440   MCV 95.3 03/01/2019 0440   MCH 32.2 03/01/2019 0440   MCHC 33.8 03/01/2019 0440   RDW 12.5 03/01/2019 0440   LYMPHSABS 1.1 02/23/2019 1639   MONOABS 0.5 02/23/2019 1639   EOSABS 0.0  02/23/2019 1639   BASOSABS 0.0 02/23/2019 1639   CMP    Component Value Date/Time   NA 136 03/01/2019 0440   K 3.8 03/01/2019 0440   CL 100 03/01/2019 0440   CO2 24 03/01/2019 0440   GLUCOSE 103 (H) 03/01/2019 0440   BUN 12 03/01/2019 0440   CREATININE 0.96 03/01/2019 0440   CALCIUM 9.5 03/01/2019 0440   PROT 7.5 02/23/2019 1639   ALBUMIN 3.8 02/23/2019 1639   AST 15 02/23/2019 1639   ALT 14 02/23/2019 1639   ALKPHOS 57 02/23/2019 1639   BILITOT 0.5 02/23/2019 1639   GFRNONAA >60 03/01/2019 0440   GFRAA >60 03/01/2019 0440   COAGS Lab Results  Component Value Date   INR 1.0 02/23/2019   INR 0.9 01/28/2019   Lipid Panel    Component Value Date/Time   CHOL 249 (H) 02/25/2019 0403   TRIG 225 (H) 02/25/2019 0403   HDL 47 02/25/2019 0403   CHOLHDL 5.3 02/25/2019 0403   VLDL 45 (H) 02/25/2019 0403   LDLCALC 157 (H) 02/25/2019 0403   HgbA1C  Lab Results  Component Value Date   HGBA1C 5.7 (H) 02/25/2019   Urinalysis    Component Value Date/Time   COLORURINE YELLOW 02/23/2019 1957   APPEARANCEUR CLEAR 02/23/2019 1957   LABSPEC 1.014  02/23/2019 1957   PHURINE 8.0 02/23/2019 1957   GLUCOSEU NEGATIVE 02/23/2019 1957   HGBUR NEGATIVE 02/23/2019 1957   BILIRUBINUR NEGATIVE 02/23/2019 1957   KETONESUR 5 (A) 02/23/2019 1957   PROTEINUR NEGATIVE 02/23/2019 1957   UROBILINOGEN 1.0 06/30/2011 2202   NITRITE NEGATIVE 02/23/2019 1957   LEUKOCYTESUR NEGATIVE 02/23/2019 1957   Urine Drug Screen     Component Value Date/Time   LABOPIA NONE DETECTED 02/23/2019 1957   COCAINSCRNUR NONE DETECTED 02/23/2019 1957   LABBENZ NONE DETECTED 02/23/2019 1957   AMPHETMU NONE DETECTED 02/23/2019 1957   THCU NONE DETECTED 02/23/2019 1957   LABBARB NONE DETECTED 02/23/2019 1957    Alcohol Level    Component Value Date/Time   ETH <10 01/28/2019 1554     SIGNIFICANT DIAGNOSTIC STUDIES..  Ct Angio Head W Or Wo Contrast  Result Date: 02/23/2019 CLINICAL DATA:  58 y/o  M;  cerebral hemorrhage. EXAM: CT ANGIOGRAPHY HEAD TECHNIQUE: Multidetector CT imaging of the head was performed using the standard protocol during bolus administration of intravenous contrast. Multiplanar CT image reconstructions and MIPs were obtained to evaluate the vascular anatomy. CONTRAST:  OMNIPAQUE IOHEXOL 350 MG/ML SOLN COMPARISON:  02/23/2019 CT head. FINDINGS: CTA HEAD Anterior circulation: No significant stenosis, proximal occlusion, aneurysm, or vascular malformation. Mild non stenotic calcific atherosclerosis of carotid siphons. Posterior circulation: No significant stenosis, proximal occlusion, aneurysm, or vascular malformation. Venous sinuses: As permitted by contrast timing, patent. Anatomic variants: Large left A1 and anterior communicating artery, small right A1, normal variant. Small right and probable diminutive left posterior communicating arteries. Delayed phase: No abnormal intracranial enhancement. IMPRESSION: 1. Patent anterior and posterior intracranial circulation. No large vessel occlusion, aneurysm, vascular malformation, or significant stenosis. 2. Stable acute hematoma within the bilateral cerebellar hemispheres and subacute hemorrhage within the left external capsule. No active extravasation or enhancing lesion identified. Electronically Signed   By: Mitzi Hansen M.D.   On: 02/23/2019 20:30   Dg Chest 2 View  Result Date: 01/28/2019 CLINICAL DATA:  LEFT-sided chest pain for 5 days. EXAM: CHEST - 2 VIEW COMPARISON:  09/19/2008. FINDINGS: Suspected lingular infiltrate/atelectasis, with volume loss in the LEFT hemithorax. No effusion. No pneumothorax. RIGHT lung clear. Heart size normal. No osseous findings. IMPRESSION: Suspected lingular infiltrate/atelectasis, with volume loss in the LEFT hemithorax. Electronically Signed   By: Elsie Stain M.D.   On: 01/28/2019 12:33   Ct Head Wo Contrast  Result Date: 02/24/2019 CLINICAL DATA:  58 y/o  M; intracranial  hemorrhage for follow-up EXAM: CT HEAD WITHOUT CONTRAST TECHNIQUE: Contiguous axial images were obtained from the base of the skull through the vertex without intravenous contrast. COMPARISON:  02/23/2019 CT head and CTA head. FINDINGS: Brain: Acute hemorrhage within right cerebellar hemisphere is stable measuring 31 x 24 mm in axial plane (series 3, image 8) and up to 44 mm in coronal plane (series 5, image 52). Acute hemorrhage in left cerebellar hemisphere is stable measuring 23 x 18 mm in axial plane (series 3, image 9). Stable subtle density in left external capsule compatible with subacute hemorrhage. Mass effect from the hematomas within the cerebellum well as associated vasogenic edema results in mild mass effect within the posterior fossa partially effacing the fourth ventricle. No associated hydrocephalus. No new hemorrhage, extra-axial collection, mass effect, stroke, or herniation is identified. Stable nonspecific white matter hypodensities compatible with chronic microvascular ischemic changes and stable volume loss of the brain. Stable small chronic lacunar infarcts within the bilateral thalami and putamen. Vascular: Calcific atherosclerosis  of the carotid siphons. No hyperdense vessel identified. Skull: Normal. Negative for fracture or focal lesion. Sinuses/Orbits: Mild mucosal thickening of the sphenoid sinus and ethmoid air cells. Additional visible paranasal sinuses and the mastoid air cells are normally aerated. Orbits are unremarkable. Other: None. IMPRESSION: Stable acute hemorrhage within the bilateral cerebellar hemispheres and subacute hemorrhage within the left external capsule. Stable associated vasogenic edema and mild local mass effect. No new acute intracranial abnormality identified. Electronically Signed   By: Mitzi Hansen M.D.   On: 02/24/2019 00:27   Ct Head Wo Contrast  Result Date: 02/23/2019 CLINICAL DATA:  Emesis and dizziness since this morning. EXAM: CT HEAD  WITHOUT CONTRAST TECHNIQUE: Contiguous axial images were obtained from the base of the skull through the vertex without intravenous contrast. COMPARISON:  Head CT dated 01/31/2019. FINDINGS: Brain: 2 foci of acute hemorrhage within the cerebellum. The cerebellar hemorrhage to the RIGHT of midline measures 3.1 x 2.7 cm. The smaller cerebellar hemorrhage to the LEFT of midline measures 2.3 x 1.8 cm. Edema surrounds both sites of hemorrhage with associated local mass effect (sulcal effacement). No midline shift or herniation. Questionable small acute to subacute hemorrhage within the LEFT external capsule. No additional parenchymal hemorrhage. No extra-axial hemorrhage. No hydrocephalus. Mild chronic small vessel ischemic changes within the deep periventricular white matter regions bilaterally. Vascular: No hyperdense vessel or unexpected calcification. Skull: Normal. Negative for fracture or focal lesion. Sinuses/Orbits: No acute finding. Other: None. IMPRESSION: 1. Two foci of acute hemorrhage within the cerebellum, largest measuring 3 cm greatest dimension. Associated edema at both sites of hemorrhage causing local mass effect with sulcal effacement. No midline shift or evidence of herniation. Distribution suggest hypertensive hemorrhage, possibly complicated by recent commencement of Eliquis. 2. Small acute to subacute hemorrhage within the LEFT external capsule. 3. Mild chronic small vessel ischemic changes within the periventricular white matter. Critical Value/emergent results were called by telephone at the time of interpretation on 02/23/2019 at 6:20 pm to Dr. Leonia Corona , who verbally acknowledged these results. Electronically Signed   By: Bary Richard M.D.   On: 02/23/2019 18:23   Ct Head Wo Contrast  Result Date: 01/31/2019 CLINICAL DATA:  Acute metabolic encephalopathy. EXAM: CT HEAD WITHOUT CONTRAST TECHNIQUE: Contiguous axial images were obtained from the base of the skull through the vertex  without intravenous contrast. COMPARISON:  09/26/2008 FINDINGS: Brain: No evidence of acute infarction, hemorrhage, hydrocephalus, extra-axial collection or mass lesion/mass effect. Lacunar infarct in the bilateral thalamus that are discrete and chronic appearing on coronal reformats. Vascular: No hyperdense vessel or unexpected calcification. Skull: Normal. Negative for fracture or focal lesion. Sinuses/Orbits: No acute finding. IMPRESSION: 1. No acute finding. 2. 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 Electronically Signed   By: Marnee Spring M.D.   On: 01/31/2019 09:24   Ct Angio Chest Pe W/cm &/or Wo Cm  Result Date: 01/28/2019 CLINICAL DATA:  Chest pain EXAM: CT ANGIOGRAPHY CHEST WITH CONTRAST TECHNIQUE: Multidetector CT imaging of the chest was performed using the standard protocol during bolus administration of intravenous contrast. Multiplanar CT image reconstructions and MIPs were obtained to evaluate the vascular anatomy. CONTRAST:  74mL ISOVUE-370 COMPARISON:  Chest film from earlier in the same FINDINGS: Cardiovascular: Atherosclerotic calcifications of the thoracic aorta are noted without aneurysmal dilatation or dissection. No cardiac enlargement is noted. There are changes suggestive of mild right heart strain within RV/LV ratio of 1. The pulmonary artery demonstrates bilateral pulmonary emboli throughout the entire arterial system. Mediastinum/Nodes: The esophagus  shows evidence of a small sliding-type hiatal hernia. The thoracic inlet is within normal limits. No sizable hilar or mediastinal adenopathy is identified. Lungs/Pleura: Mild infiltrate is noted within the left lingula. This corresponds to the area of abnormality on recent chest x-ray and likely in part related to the known pulmonary embolism. No sizable effusion is seen. Mild emphysematous changes are noted. No sizable parenchymal nodules are seen. Upper Abdomen: Visualized upper abdomen is within normal limits.  Musculoskeletal: Degenerative changes of the thoracic spine are seen. No compression deformities are noted. Review of the MIP images confirms the above findings. IMPRESSION: Positive for acute PE with CT evidence of right heart strain (RV/LV Ratio = 1) consistent with at least submassive (intermediate risk) PE. The presence of right heart strain has been associated with an increased risk of morbidity and mortality. Please activate Code PE by paging [phone]. Mild lingular infiltrate which is likely related to the underlying pulmonary embolism. Aortic Atherosclerosis (ICD10-I70.0) and Emphysema (ICD10-J43.9). Critical Value/emergent results were called by telephone at the time of interpretation on 01/28/2019 at 3:10 pm to [facility] CenterKELLY GEKAS, PA , who verbally acknowledged these results. Electronically Signed   By: Alcide CleverMark  Lukens M.D.   On: 01/28/2019 15:13   Mr Laqueta JeanBrain W WGWo Contrast  Result Date: 02/24/2019 CLINICAL DATA:  Intracranial hemorrhage follow up EXAM: MRI HEAD WITHOUT AND WITH CONTRAST TECHNIQUE: Multiplanar, multiecho pulse sequences of the brain and surrounding structures were obtained without and with intravenous contrast. CONTRAST:  7.5 mL Gadavist COMPARISON:  Head CT 02/24/2019 FINDINGS: BRAIN: There are magnetic susceptibility effects corresponding to known hemorrhage within both cerebellar hemispheres and within the left posterior basal ganglia/external capsule. There is chronic microhemorrhage evident in the right thalamus. This obscures diffusion analysis. The midline structures are normal. Mass effect in the posterior fossa is unchanged. No midline shift or herniation. The size of the cerebellar hematomas is unchanged. Multifocal white matter hyperintensity, most commonly due to chronic ischemic microangiopathy. Advanced atrophy for age. No abnormal contrast enhancement. VASCULAR: The major intracranial arterial and venous sinus flow voids are normal. SKULL AND UPPER CERVICAL SPINE: Calvarial bone  marrow signal is normal. There is no skull base mass. Visualized upper cervical spine and soft tissues are normal. SINUSES/ORBITS: No fluid levels or advanced mucosal thickening. No mastoid or middle ear effusion. The orbits are normal. IMPRESSION: 1. Unchanged size of intraparenchymal hematomas within both cerebellar hemispheres, right greater than left. 2. Unchanged size of small intraparenchymal hematoma at the junction of the posterior left basal ganglia and external capsule. 3. Mild mass effect on the posterior fossa without hydrocephalus. No supratentorial midline shift or herniation. 4. Mild chronic small vessel disease and age advanced atrophy. Electronically Signed   By: Deatra RobinsonKevin  Herman M.D.   On: 02/24/2019 13:59   Ir Ivc Filter Plmt / S&i /img Guid/mod Sed  Result Date: 02/25/2019 INDICATION: 58 year old with history of bilateral pulmonary emboli and recent intracranial hemorrhage. Patient needs to stop anticoagulation and needs pulmonary embolism prophylaxis. EXAM: IVC FILTER PLACEMENT; IVC VENOGRAM; ULTRASOUND FOR VASCULAR ACCESS Physician: Rachelle HoraAdam R. Lowella DandyHenn, MD MEDICATIONS: None. ANESTHESIA/SEDATION: Fentanyl 25 mcg IV; Versed 1.0 mg IV Moderate Sedation Time:  17 minutes The patient was continuously monitored during the procedure by the interventional radiology nurse under my direct supervision. CONTRAST:  40 mL Omnipaque 300 FLUOROSCOPY TIME:  Fluoroscopy Time: 1 minutes, 12 seconds, 28 mGy COMPLICATIONS: None immediate. PROCEDURE: The procedure was explained to the patient. The risks and benefits of the procedure were discussed and the patient's  questions were addressed. Informed consent was obtained from the patient. Ultrasound demonstrated a patent right internal jugular vein. Ultrasound images were obtained for documentation. The right side of the neck was prepped and draped in a sterile fashion. Maximal barrier sterile technique was utilized including caps, mask, sterile gowns, sterile gloves,  sterile drape, hand hygiene and skin antiseptic. The skin was anesthetized with 1% lidocaine. A 21 gauge needle was directed into the vein with ultrasound guidance and a micropuncture dilator set was placed. A wire was advanced into the IVC. The filter sheath was advanced over the wire into the IVC. An IVC venogram was performed. Fluoroscopic images were obtained for documentation. A Bard Denali filter was deployed below the lowest renal vein. A follow-up venogram was performed and the vascular sheath was removed with manual compression. FINDINGS: IVC was patent. Bilateral renal veins were identified. The filter was deployed below the lowest renal vein. Follow-up venogram confirmed placement within the IVC and below the renal veins. IMPRESSION: Successful placement of a retrievable IVC filter. PLAN: This IVC filter is potentially retrievable. The patient will be assessed for filter retrieval by Interventional Radiology in approximately 8-12 weeks. Further recommendations regarding filter retrieval, continued surveillance or declaration of device permanence, will be made at that time. Electronically Signed   By: Richarda Overlie M.D.   On: 02/25/2019 13:04   Vas [place] Transcranial Doppler W Bubbles  Result Date: 02/25/2019  Transcranial Doppler with Bubble Indications: Stroke. Performing Technologist: Jeb Levering RDMS, RVT  Examination Guidelines: A complete evaluation includes B-mode imaging, spectral Doppler, color Doppler, and power Doppler as needed of all accessible portions of each vessel. Bilateral testing is considered an integral part of a complete examination. Limited examinations for reoccurring indications may be performed as noted.  Summary:  A vascular evaluation was performed. The right middle cerebral artery was studied. An IV was inserted into the patient's left forearm. Verbal informed consent was obtained.  No HITS at rest or during Valsalva. No apparent PFO. Negative TCD Bubble study *See table(s)  above for measurements and observations.  Diagnosing physician: Delia Heady MD Electronically signed by Delia Heady MD on 02/25/2019 at 5:07:19 PM.    Final    Vas [place] Lower Extremity Venous (dvt) (only [facility])  Result Date: 01/29/2019  Lower Venous Study Indications: Edema, SOB, and pulmonary embolism.  Risk Factors: Confirmed PE. Performing Technologist: Toma Deiters RVS  Examination Guidelines: A complete evaluation includes B-mode imaging, spectral Doppler, color Doppler, and power Doppler as needed of all accessible portions of each vessel. Bilateral testing is considered an integral part of a complete examination. Limited examinations for reoccurring indications may be performed as noted.  Right Venous Findings: +---------+---------------+---------+-----------+----------+--------------+          CompressibilityPhasicitySpontaneityPropertiesSummary        +---------+---------------+---------+-----------+----------+--------------+ CFV      Full           Yes                                          +---------+---------------+---------+-----------+----------+--------------+ SFJ      Full                                                        +---------+---------------+---------+-----------+----------+--------------+  FV Prox  Full           Yes      Yes                                 +---------+---------------+---------+-----------+----------+--------------+ FV Mid   None           No       No                                  +---------+---------------+---------+-----------+----------+--------------+ FV DistalNone           No       No                                  +---------+---------------+---------+-----------+----------+--------------+ PFV      Full           Yes      Yes                                 +---------+---------------+---------+-----------+----------+--------------+ POP      None                                                         +---------+---------------+---------+-----------+----------+--------------+ PTV      Partial                                      distal region  +---------+---------------+---------+-----------+----------+--------------+ PERO                                                  Not visualized +---------+---------------+---------+-----------+----------+--------------+ SSV      Partial                                                     +---------+---------------+---------+-----------+----------+--------------+ Unable to image the iliac veins  Left Venous Findings: +---------+---------------+---------+-----------+----------+-------------------+          CompressibilityPhasicitySpontaneityPropertiesSummary             +---------+---------------+---------+-----------+----------+-------------------+ CFV      Partial        Yes      Yes                  mobile in the mid                                                         region at the sfj   +---------+---------------+---------+-----------+----------+-------------------+ SFJ      Partial  mobile              +---------+---------------+---------+-----------+----------+-------------------+ FV Prox  None                                                             +---------+---------------+---------+-----------+----------+-------------------+ FV Mid   None                                                             +---------+---------------+---------+-----------+----------+-------------------+ FV DistalNone                                                             +---------+---------------+---------+-----------+----------+-------------------+ PFV      Full           Yes      Yes                                      +---------+---------------+---------+-----------+----------+-------------------+ POP      Partial                                       minimal flow        +---------+---------------+---------+-----------+----------+-------------------+ PTV      Full                                                             +---------+---------------+---------+-----------+----------+-------------------+ PERO                                                  not visualized due                                                        to edema            +---------+---------------+---------+-----------+----------+-------------------+ Unable to visualize the iliac veins    Summary: Right: Findings consistent with acute deep vein thrombosis involving the right femoral vein, right popliteal vein, right posterior tibial vein, and right gastrocnemius vein. See summaru comnets listed above Left: Findings consistent with acute deep vein thrombosis involving the left common femoral vein, left femoral vein, and left popliteal vein. See summary comments listed above  *See table(s) above for measurements and observations. Electronically signed by Gretta Began MD on 01/29/2019 at 5:02:17 PM.  Final        2D echocardiogram 1. The left ventricle has normal systolic function with an ejection fraction of 60-65%. The cavity size was normal. Left ventricular diastolic Doppler parameters are consistent with impaired relaxation. 2. The mitral valve is grossly normal. 3. The tricuspid valve is grossly normal. 4. The aortic valve is grossly normal. 5. The interatrial septum was not assessed.   HISTORY OF PRESENT ILLNESS (From Dr Bess Harvest H&P 02/23/2019) Cody Porter is a 58 y.o. male past medical history of PE diagnosed in March started on Eliquis, DVT, paroxysmal atrial fibrillation, alcohol abuse, presented to the emergency room for evaluation of dizziness. According to the patient, he was starting to feel unwell yesterday for 10/03/2019 around 6 PM when he had a couple of bouts of vomiting.  He went to bed at that time not making much of what  ever was going on.  He woke up at 4 AM with worsened dizziness that he describes as room spinning.  He was also very nauseous and had another episode of vomiting.  He then had a syncopal episode, which prompted coming to the ER. Also of note, he has been having a headache which is not usual for him. Denies chest pain shortness of breath.  Denies sick contact.  Denies fevers chills.  Denies abdominal pain.  Reports nausea reports vomiting.  Reports headache.  Denies visual symptoms.  Reports dizziness.  He was unable to tell me exactly when he took his last dose of Eliquis-this was either last night or this morning.  For me, he did say that he took it last night but to the ER he said he had taken it last evening.  He could not really tell me an exact time.  Based on what he had initially said, we would imagine that his last dose was sometime this morning between 4 and 8 AM. Blood pressures on arrival were high in the 180s. Phone consultation was made with the on-call day MD from neurology who recommended Cleviprex for blood pressure control and reversal of Eliquis.  Initially Theodoro Parma was ordered but on my evaluation and getting history of Eliquis, I recommend reversal with Andexxa.  LKW: 6 PM on 02/22/2019 tpa given?: no, ICH Premorbid modified Rankin scale (mRS):0 ICH Score: 1 for infratentorial origin  HOSPITAL COURSE Cody Porter a 58 y.o.malewith history of HTN, AF, DVT, PE on Eliquis and tobacco abuse who developed vomiting and dizziness at home w/ HA, presenting to ED with syncope. CT showed hmg, reversed w/ Andexxa.  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 Unclear if primary ICH vs infarct with hemorrhagic transformation  CT head R &L cerebellar hemorrhages w/ edema. Small L external capsule hmg. Small vessel disease.   CTA head & neck no LVO. Stable hmg B cerebellar and L external  capsule  CT head stable B cerebellar hmg and subacute L external capsule hmg. Stable edema and mass effect.  2D Echo3/18/2020 EF 60-65%. No source of embolus   MRI w/w/o unchanged B cerebellar hmgs R >L. uncahged L BG hmg. Mild mass effect posterior fossa w/o hydro, no shift. Mild Small vessel disease. Atrophy.  TCD w/ bubble no HITS, no PFO  LDL157  HgbA1c5.7  Check hypercoagulable labs in future (recent Andexxa)  Eliquis (apixaban) dailyprior to admission, now aspirin 81 mg daily  Therapy recommendations: CIR  Disposition: CIR   Cerebral Edema, resolved Induced Hypernatremia,  resolved  Placed on 3% protocol via PIV, now off  Na 136  Atrial Fibrillation  Home anticoagulation: Eliquis (apixaban) daily  Home meds: Metoprolol 25 bid  Not an anticoagulation candidate due to hemorrhage  Added aspirin 81 mg daily 4/17  B LE DVT PE  B LE DVTs per doppler 01/28/2019 on Eliquis  PE 01/2019 on Eliquis  IVC Filter placed 02/25/19  Not an anticoagulation candidate due to hemorrhage  Hypertensive Emergency  stable  Home meds: Metoprolol 25 bid  Offcleviprex  onnorvasc 5 mg daily and metoprolol 25 bid  BP goal at d/c normotensive  Other Stroke Risk Factors  Cigarette smoker. advised to stop smoking  ETOH use, hx ETOH abuse, advised to drink no more than 2 drink(s) a day  Hx marijuana use, UDS neg this admission  Family hx stroke  ? Heart failure   Other Active Problems  Fall at home prior to admission  Hypokalemia, resolved 3.5   DISCHARGE EXAM Blood pressure (!) 156/89, pulse 79, temperature 97.6 F (36.4 C), temperature source Oral, resp. rate 18, height  (1.727 m), weight 78.1 kg, SpO2 96 %. Pleasant middle-age male currently not in distress. . Afebrile. Head is nontraumatic. Neck is supple without bruit. Cardiac exam no murmur or gallop. Lungs are clear to auscultation. Distal pulses are well felt. Neurological  Exam ;  Awake Alert oriented x 3. Normal speech and language.eye movements full without nystagmus. Mild saccadic dysmetria on right lateral gaze. Fundi were not visualized. Vision acuity and fields appear normal. Hearing is significantly diminished bilaterally. Palatal movements are normal. Face symmetric. Tongue midline. Normal strength, tone, reflexes and coordination. Normal sensation. Gait deferred  Discharge Diet heart healthy thin liquids  DISCHARGE PLAN  Disposition:  Transfer to [facility] for ongoing PT, OT and ST  aspirin 81 mg daily for secondary stroke prevention  Recommend ongoing stroke risk factor control by Primary Care Physician at time of discharge from inpatient rehabilitation.  Follow-up primary care provider in 2 weeks following discharge from rehab.  Follow-up in [facility] in 4 weeks following discharge from rehab, office to schedule an appointment.     35 minutes were spent preparing discharge.  Delton See PA-C [facility] Pager [phone] 03/01/2019, 2:05 PM I have personally obtained history,examined this patient, reviewed notes, independently viewed imaging studies, participated in medical decision making and plan of care.ROS completed by me personally and pertinent positives fully documented  I have made any additions or clarifications directly to the above note. Agree with note above.    Delia Heady, MD Medical Director [facility] Pager: [phone] 03/02/2019 8:31 AM

## 2019-03-02 ENCOUNTER — Inpatient Hospital Stay (HOSPITAL_COMMUNITY): Payer: Self-pay | Admitting: Occupational Therapy

## 2019-03-02 ENCOUNTER — Inpatient Hospital Stay (HOSPITAL_COMMUNITY): Payer: Self-pay

## 2019-03-02 NOTE — Progress Notes (Signed)
[place] PHYSICAL MEDICINE & REHABILITATION PROGRESS NOTE   Subjective/Complaints: Had a reasonable night. Up with OT early to bathroom  ROS: Patient denies fever, rash, sore throat, blurred vision, nausea, vomiting, diarrhea, cough, shortness of breath or chest pain, joint or back pain, headache, or mood change.    Objective:   No results found. Recent Labs    02/28/19 0846 03/01/19 0440  WBC 6.5 7.5  HGB 16.3 15.8  HCT 50.0 46.7  PLT 297 294   Recent Labs    02/28/19 0846 03/01/19 0440  NA 136 136  K 3.8 3.8  CL 101 100  CO2 21* 24  GLUCOSE 111* 103*  BUN 12 12  CREATININE 0.96 0.96  CALCIUM 9.3 9.5    Intake/Output Summary (Last 24 hours) at 03/02/2019 1019 Last data filed at 03/02/2019 0226 Gross per 24 hour  Intake -  Output 350 ml  Net -350 ml     Physical Exam: Vital Signs Blood pressure (!) 153/97, pulse 80, temperature 97.7 F (36.5 C), temperature source Oral, resp. rate 16, height 5\' 8"  (1.727 m), weight 78.1 kg, SpO2 95 %. Constitutional: No distress . Vital signs reviewed. HEENT: EOMI, oral membranes moist Neck: supple Cardiovascular: RRR without murmur. No JVD    Respiratory: CTA Bilaterally without wheezes or rales. Normal effort    GI: BS +, non-tender, non-distended  Musculoskeletal:Normal range of motion.  General: No edema.  Neurological: Alert and oriented x 3. Normal language. Fair insight and awareness. Decreased fine motor coordination bilaterally with finger to nose testing as well as heel to toe which is unchanged from yesterday. Strength grossly 4/5 all 4's. Normal LT and pain sense.  Skin: Skin iswarmand dry. He isnot diaphoretic.  Psychiatric: cooperative.    Assessment/Plan: 1. Functional deficits secondary to bilateral cerebellar hemorrhages which require 3+ hours per day of interdisciplinary therapy in a comprehensive inpatient rehab setting.  Physiatrist is providing close team supervision and 24 hour  management of active medical problems listed below.  Physiatrist and rehab team continue to assess barriers to discharge/monitor patient progress toward functional and medical goals  Care Tool:  Bathing    Body parts bathed by patient: Right arm, Left upper leg, Left arm, Right lower leg, Chest, Left lower leg, Abdomen, Front perineal area, Right upper leg   Body parts bathed by helper: Buttocks     Bathing assist Assist Level: Minimal Assistance - Patient > 75%     Upper Body Dressing/Undressing Upper body dressing   What is the patient wearing?: Button up shirt    Upper body assist Assist Level: Minimal Assistance - Patient > 75%    Lower Body Dressing/Undressing Lower body dressing      What is the patient wearing?: Underwear/pull up, Pants     Lower body assist Assist for lower body dressing: Minimal Assistance - Patient > 75%     Toileting Toileting    Toileting assist Assist for toileting: Minimal Assistance - Patient > 75%     Transfers Chair/bed transfer  Transfers assist     Chair/bed transfer assist level: Minimal Assistance - Patient > 75%     Locomotion Ambulation   Ambulation assist              Walk 10 feet activity   Assist           Walk 50 feet activity   Assist           Walk 150 feet activity   Assist  Walk 10 feet on uneven surface  activity   Assist           Wheelchair     Assist               Wheelchair 50 feet with 2 turns activity    Assist            Wheelchair 150 feet activity     Assist          Medical Problem List and Plan: 1.Decreased functional mobilitysecondary to bilateral cerebellar and subacute left external capsule hemorrhage with surrounding edema most likely secondary to Eliquis which has been reversed and elevated blood pressure -Patient is beginning CIR therapies today including PT and OT SLP 2.  Antithrombotics: -DVT/anticoagulation/history of pulmonary emboli:Status Post IVC filter 02/25/2019 -antiplatelet therapy: N/A 3. Pain Management:Tylenol as needed 4. Mood:provide emotional support -antipsychotic agents: N/A 5. Neuropsych: This patientiscapable of making decisions on hisown behalf. 6. Skin/Wound Care:Routine skin checks 7. Fluids/Electrolytes/Nutrition:Routine ins and outs with follow-up chemistries 8. Hypertension. Cleviprex discontinued. Norvasc 5 mg daily, Lopressor 25 mg twice a day.   -SBP elevated---observe for pattern, consistency 9. OHK:GOVPCHE rate controlled at present  - Continue Lopressor 10. History of alcohol tobacco abuse. Provide counseling    LOS: 1 days A FACE TO FACE EVALUATION WAS PERFORMED  Ranelle Oyster 03/02/2019, 10:19 AM

## 2019-03-02 NOTE — Evaluation (Signed)
Physical Therapy Assessment and Plan  Patient Details  Name: Cody Porter MRN: [id number] Date of Birth: [birth year]  PT Diagnosis: Abnormal posture, Cognitive deficits, Difficulty walking, Dizziness and giddiness, Impaired cognition and Muscle weakness Rehab Potential: Good ELOS: 7-9 days   Today's Date: 03/02/2019 PT Individual Time: 1100-1158 and 1556-1650 PT Individual Time Calculation (min): 58 min and 54 min  Problem List:  Patient Active Problem List   Diagnosis Date Noted  . Cerebral edema ([place]) 02/28/2019  . Atrial fibrillation ([place]) 02/28/2019  . Cigarette smoker 02/28/2019  . Family history of stroke 02/28/2019  . ICH (intracerebral hemorrhage) ([place]) B cerebellar and L external capsule hmg while on Eliquis 02/23/2019  . ETOH abuse 01/28/2019  . Pulmonary embolus ([place]) 01/28/2019  . Leg DVT (deep venous thromboembolism), acute, bilateral ([place]) 01/28/2019  . Acute pulmonary embolism ([place]) 01/28/2019    Past Medical History:  Past Medical History:  Diagnosis Date  . ETOH abuse 01/28/2019  . Leg DVT (deep venous thromboembolism), acute, bilateral ([place]) 01/28/2019  . Pulmonary embolus ([place]) 01/28/2019   Past Surgical History:  Past Surgical History:  Procedure Laterality Date  . HERNIA REPAIR    . IR IVC FILTER PLMT / S&I /IMG GUID/MOD SED  02/25/2019  . TONSILLECTOMY      Assessment & Plan Clinical Impression: Patient is a 58 y.o. year old male with history of pulmonary emboli diagnosed in March started on Eliquis as well as history of PAF, alcohol abuse and tobacco use. Patient lives with elderlymother. Two-level home bedroom bath upstairs. Reportedly independent prior to admissionand still working. Presented for 11/02/2019 with dizziness/headache, nausea and vomiting.blood pressure was systolic in the 158X. Placed on Cleviprex for blood pressure control. Cranial CT scan showed 2 foci of acute hemorrhage within the cerebellum, largest measuring 3 cm greatest  dimension. Associated edema at both sites of the hemorrhage causing local mass effect with sulcal effacement. No midline shift or evidence of herniation. Eliquis was reversed with Andexxa. Urine drug screen negative. Hyperosmolar therapy 3% saline initiated.Most recent echocardiogram with ejection fraction of 09% normal systolic function. In regards to recent pulmonary emboli and eliquis discontinued due to hemorrhage. Interventional radiology consulted and underwent IVC placement 02/25/2019. Patient is tolerating a regular diet. Close monitoring of blood pressure. Therapy evaluations completed and patient was admitted for a comprehensive rehabilitation program. Patient transferred to CIR on 03/01/2019 .   Patient currently requires min with mobility secondary to decreased coordination, decreased attention and decreased awareness, central origin and decreased standing balance and decreased balance strategies.  Prior to hospitalization, patient was independent  with mobility and lived with Family in a Other(Comment)(townhome) home.  Home access is 1 to 3Stairs to enter.  Patient will benefit from skilled PT intervention to maximize safe functional mobility, minimize fall risk and decrease caregiver burden for planned discharge home with 24 hour supervision.  Anticipate patient will benefit from follow up [place] at discharge.  PT - End of Session Activity Tolerance: Tolerates 30+ min activity with multiple rests Endurance Deficit: Yes PT Assessment Rehab Potential (ACUTE/IP ONLY): Good PT Barriers to Discharge: Inaccessible home environment;Decreased caregiver support PT Barriers to Discharge Comments: 2 story home, elderly mother PT Patient demonstrates impairments in the following area(s): Balance;Behavior;Motor;Safety;Perception PT Transfers Functional Problem(s): Bed Mobility;Bed to Chair;Car;Furniture PT Locomotion Functional Problem(s): Ambulation;Wheelchair Mobility;Stairs PT Plan PT Intensity:  Minimum of 1-2 x/day ,45 to 90 minutes PT Frequency: 5 out of 7 days PT Duration Estimated Length of Stay: 7-9 days PT Treatment/Interventions: Ambulation/gait training;Balance/vestibular  training;Cognitive remediation/compensation;Community reintegration;Discharge planning;DME/adaptive equipment instruction;Functional mobility training;Psychosocial support;Splinting/orthotics;Therapeutic Activities;UE/LE Strength taining/ROM;Visual/perceptual remediation/compensation;Disease management/prevention;Functional electrical stimulation;Neuromuscular re-education;Patient/family education;Stair training;Therapeutic Exercise;UE/LE Coordination activities PT Transfers Anticipated Outcome(s): supervision  PT Locomotion Anticipated Outcome(s): supervision  PT Recommendation Follow Up Recommendations: Home health PT Patient destination: Home Equipment Recommended: To be determined  Skilled Therapeutic Intervention Session 1: Evaluation completed (see details above and below) with education on PT POC and goals and individual treatment initiated with focus on functional mobility, gait, balance and education. Pt seated in w/c upon PT arrival, agreeable to therapy tx and denies pain. Pt ambulated to the gym this session x 200 ft with min assist. Pt participated in berg balance test as detailed below, scored 31/56 and discussed results with pt. Pt ascended/descended 12 steps this session with single rail and min assist, switches between step to and reciprocal pattern. Pt ambulated 2 x 100 ft from gym<>ortho gym without AD and min assist, occasional staggering noted with reports of mild dizziness. Pt performed car transfer this session with min assist. Pt ambulated on unlevel surfaces with mod assist x 10 ft. Checked BP in sitting 150/91 and in standing 135/83 in order to assess for orthostatic hypotension. Pt performed bed mobility on the mat this session with supervision. Pt transported back to room and left in w/c  with chair alarm set and needs in reach.   Session 2: Pt seated in w/c upon PT arrival, agreeable to therapy tx and denies pain. Pt ambulated to the gym x 150 ft with min assist, no AD. Pt participated in vestibular testing this session as described: smooth pursuits impaired, vertical and horizontal saccades impaired with overshooting noted, (+) head impulse test bilaterally, saccadic eye movements noted during VOR cancellation testing. Pt with central vestibular vertigo, pt reports increased dizziness when walking. Pt transferred to the nustep and used x 8 minutes this session for global strengthening and endurance, resistance level 5. Pt worked on gait with head turns for habituation, increased staggering and dizziness noted during this. Parts of the Motion Sensitivity Quotient (MSQ) completed this session with the following movements as the most provacative for this patients dizziness- supine<>sitting, rolling R, head turns and turning while standing. Difficult to formally assess secondary to pt's cognitive deficits. Pt would benefit from habituation exercises with the above provacative movements. Pt worked on dynamic standing balance while performing toe taps on 4 inch step, min assist. Pt ambulated back to room with min assist x 150 ft and left in w/c with needs in reach and chair alarm set.    PT Evaluation Precautions/Restrictions Precautions Precautions: Fall Restrictions Weight Bearing Restrictions: No Pain Pain Assessment Pain Scale: 0-10 Pain Score: 0-No pain Home Living/Prior Functioning Home Living Available Help at Discharge: Family;Available 24 hours/day Type of Home: Other(Comment)(townhome) Home Access: Stairs to enter Entrance Stairs-Number of Steps: 1 to 3 Entrance Stairs-Rails: None Home Layout: Two level;1/2 bath on main level;Bed/bath upstairs;Able to live on main level with bedroom/bathroom(reports mother sleeps on first floor usuually,) Alternate Level Stairs-Number of  Steps: 12 steps Alternate Level Stairs-Rails: Right;Left Bathroom Shower/Tub: Tub/shower unit;Curtain Bathroom Toilet: Standard Bathroom Accessibility: Yes  Lives With: Family Prior Function Level of Independence: Independent with basic ADLs;Independent with transfers;Independent with homemaking with ambulation  Able to Take Stairs?: Yes Driving: Yes Vocation: (does not work) Vision/Perception  Geologist, engineering: Within Advertising copywriter Praxis Praxis: Intact  Cognition Overall Cognitive Status: Impaired/Different from baseline Arousal/Alertness: Awake/alert Orientation Level: Oriented X4 Attention: Focused;Sustained Focused Attention: Impaired Focused Attention Impairment: Functional basic Sustained Attention: Impaired  Sustained Attention Impairment: Verbal basic;Functional complex Memory: Impaired Memory Impairment: Retrieval deficit;Decreased short term memory Decreased Short Term Memory: Verbal complex Awareness: Impaired Awareness Impairment: Intellectual impairment Problem Solving: Impaired Problem Solving Impairment: Functional basic;Verbal basic Executive Function: Sequencing;Self Correcting Reasoning: Impaired Reasoning Impairment: Verbal basic;Functional basic Behaviors: Impulsive Safety/Judgment: Impaired Comments: Decreased safety awareness and decreased awareness of deficits and functional implications.  Sensation Sensation Light Touch: Appears Intact Proprioception: Appears Intact Additional Comments: sensation appears intact B LEs Coordination Gross Motor Movements are Fluid and Coordinated: No Fine Motor Movements are Fluid and Coordinated: Yes Coordination and Movement Description: mild incoordination noted during functional movements/gait Heel Shin Test: [facility] bilaterallty Motor  Motor Motor: Other (comment) Motor - Skilled Clinical Observations: generalized weakness  Mobility Bed Mobility Bed Mobility: Rolling Right;Rolling Left;Supine to  Sit;Sit to Supine Rolling Right: Supervision/verbal cueing Rolling Left: Supervision/Verbal cueing Supine to Sit: Supervision/Verbal cueing Sit to Supine: Supervision/Verbal cueing Transfers Transfers: Stand Pivot Transfers;Sit to Stand;Stand to Sit Sit to Stand: Contact Guard/Touching assist Stand to Sit: Contact Guard/Touching assist Stand Pivot Transfers: Minimal Assistance - Patient > 75% Stand Pivot Transfer Details: Verbal cues for precautions/safety;Verbal cues for technique Transfer (Assistive device): None Locomotion  Gait Ambulation: Yes Gait Assistance: Minimal Assistance - Patient > 75% Gait Distance (Feet): 150 Feet Assistive device: None Gait Assistance Details: Verbal cues for gait pattern;Verbal cues for precautions/safety Gait Gait: Yes Gait Pattern: (occasional staggering) Gait velocity: decreased Stairs / Additional Locomotion Stairs: Yes Stairs Assistance: Minimal Assistance - Patient > 75% Stair Management Technique: One rail Left Number of Stairs: 12 Height of Stairs: 6  Trunk/Postural Assessment  Cervical Assessment Cervical Assessment: Within Functional Limits Thoracic Assessment Thoracic Assessment: Exceptions to WFL(rounded shoulders) Lumbar Assessment Lumbar Assessment: Exceptions to WFL(posterior pelvic tilt) Postural Control Postural Control: Deficits on evaluation Protective Responses: delayed  Balance Balance Balance Assessed: Yes Standardized Balance Assessment Standardized Balance Assessment: Berg Balance Test Berg Balance Test Sit to Stand: Able to stand  independently using hands Standing Unsupported: Able to stand 2 minutes with supervision Sitting with Back Unsupported but Feet Supported on Floor or Stool: Able to sit 2 minutes under supervision Stand to Sit: Controls descent by using hands Transfers: Needs one person to assist Standing Unsupported with Eyes Closed: Able to stand 10 seconds with supervision Standing Ubsupported  with Feet Together: Able to place feet together independently and stand for 1 minute with supervision From Standing, Reach Forward with Outstretched Arm: Reaches forward but needs supervision From Standing Position, Pick up Object from Floor: Able to pick up shoe, needs supervision From Standing Position, Turn to Look Behind Over each Shoulder: Turn sideways only but maintains balance Turn 360 Degrees: Able to turn 360 degrees safely but slowly Standing Unsupported, Alternately Place Feet on Step/Stool: Able to complete >2 steps/needs minimal assist Standing Unsupported, One Foot in Front: Able to take small step independently and hold 30 seconds Standing on One Leg: Tries to lift leg/unable to hold 3 seconds but remains standing independently Total Score: 31 Static Sitting Balance Static Sitting - Balance Support: Feet supported;No upper extremity supported Static Sitting - Level of Assistance: 5: Stand by assistance Static Sitting - Comment/# of Minutes: sitting EOB to eat breakfast Dynamic Sitting Balance Dynamic Sitting - Balance Support: During functional activity;Feet supported;No upper extremity supported Dynamic Sitting - Level of Assistance: 5: Stand by assistance Sitting balance - Comments: Sitting on tub bench to complete bathing task Static Standing Balance Static Standing - Balance Support: During functional activity;No upper extremity supported Static Standing -  Level of Assistance: 4: Min assist Dynamic Standing Balance Dynamic Standing - Balance Support: During functional activity;No upper extremity supported Dynamic Standing - Level of Assistance: 4: Min assist Dynamic Standing - Comments: Standing to complete LB clothing management Extremity Assessment  RUE Assessment RUE Assessment: Within Functional Limits LUE Assessment LUE Assessment: Within Functional Limits RLE Assessment RLE Assessment: Within Functional Limits LLE Assessment LLE Assessment: Within Functional  Limits    Refer to Care Plan for Long Term Goals  Recommendations for other services: None   Discharge Criteria: Patient will be discharged from PT if patient refuses treatment 3 consecutive times without medical reason, if treatment goals not met, if there is a change in medical status, if patient makes no progress towards goals or if patient is discharged from hospital.  The above assessment, treatment plan, treatment alternatives and goals were discussed and mutually agreed upon: by patient  Netta Corrigan, PT, DPT 03/02/2019, 11:29 AM

## 2019-03-02 NOTE — Evaluation (Signed)
Occupational Therapy Assessment and Plan  Patient Details  Name: Cody Porter MRN: [id number] Date of Birth: November 23, 1960  OT Diagnosis: abnormal posture, cognitive deficits and muscle weakness (generalized) Rehab Potential: Rehab Potential (ACUTE ONLY): Good ELOS: 7-9 days   Today's Date: 03/02/2019 OT Individual Time: 0700-0810 OT Individual Time Calculation (min): 70 min     Problem List:  Patient Active Problem List   Diagnosis Date Noted  . Cerebral edema ([place]) 02/28/2019  . Atrial fibrillation ([place]) 02/28/2019  . Cigarette smoker 02/28/2019  . Family history of stroke 02/28/2019  . ICH (intracerebral hemorrhage) ([place]) B cerebellar and L external capsule hmg while on Eliquis 02/23/2019  . ETOH abuse 01/28/2019  . Pulmonary embolus ([place]) 01/28/2019  . Leg DVT (deep venous thromboembolism), acute, bilateral ([place]) 01/28/2019  . Acute pulmonary embolism ([place]) 01/28/2019    Past Medical History:  Past Medical History:  Diagnosis Date  . ETOH abuse 01/28/2019  . Leg DVT (deep venous thromboembolism), acute, bilateral ([place]) 01/28/2019  . Pulmonary embolus ([place]) 01/28/2019   Past Surgical History:  Past Surgical History:  Procedure Laterality Date  . HERNIA REPAIR    . IR IVC FILTER PLMT / S&I /IMG GUID/MOD SED  02/25/2019  . TONSILLECTOMY      Assessment & Plan Clinical Impression: Cody Porter is a 58 year old right-handed male with history of pulmonary emboli diagnosed in March started on Eliquis as well as history of PAF, alcohol abuse and tobacco use. Patient lives with elderlymother. Two-level home bedroom bath upstairs. Reportedly independent prior to admissionand still working. Presented for 11/02/2019 with dizziness/headache, nausea and vomiting.blood pressure was systolic in the 622Q. Placed on Cleviprex for blood pressure control. Cranial CT scan showed 2 foci of acute hemorrhage within the cerebellum, largest measuring 3 cm greatest dimension. Associated edema at  both sites of the hemorrhage causing local mass effect with sulcal effacement. No midline shift or evidence of herniation. Eliquis was reversed with Andexxa. Urine drug screen negative. Hyperosmolar therapy 3% saline initiated.Most recent echocardiogram with ejection fraction of 33% normal systolic function. In regards to recent pulmonary emboli and eliquis discontinued due to hemorrhage. Interventional radiology consulted and underwent IVC placement 02/25/2019. Patient is tolerating a regular diet. Close monitoring of blood pressure. Therapy evaluations completed and patient was admitted for a comprehensive rehabilitation program.  Patient transferred to CIR on 03/01/2019 .    Patient currently requires min with basic self-care skills secondary to muscle weakness, decreased cardiorespiratoy endurance, decreased attention, decreased awareness, decreased problem solving and decreased safety awareness and decreased standing balance, decreased postural control and decreased balance strategies.  Prior to hospitalization, patient could complete ADLs/IADLs with modified independent .  Patient will benefit from skilled intervention to decrease level of assist with basic self-care skills and increase independence with basic self-care skills prior to discharge home with care partner.  Anticipate patient will require 24 hour supervision and follow up home health.  OT - End of Session Activity Tolerance: Tolerates 10 - 20 min activity with multiple rests Endurance Deficit: Yes OT Assessment Rehab Potential (ACUTE ONLY): Good OT Barriers to Discharge: Decreased caregiver support OT Patient demonstrates impairments in the following area(s): Balance;Pain;Cognition;Safety;Endurance;Motor OT Basic ADL's Functional Problem(s): Grooming;Bathing;Dressing;Toileting OT Transfers Functional Problem(s): Toilet;Tub/Shower OT Additional Impairment(s): None OT Plan OT Intensity: Minimum of 1-2 x/day, 45 to 90 minutes OT  Frequency: 5 out of 7 days OT Duration/Estimated Length of Stay: 7-9 days OT Treatment/Interventions: Balance/vestibular training;Disease mangement/prevention;Neuromuscular re-education;Patient/family education;Self Care/advanced ADL retraining;Therapeutic Exercise;UE/LE Coordination activities;UE/LE Strength taining/ROM;Therapeutic  Activities;Skin care/wound managment;Psychosocial support;Pain management;Functional mobility training;DME/adaptive equipment instruction;Discharge planning;Cognitive remediation/compensation OT Self Feeding Anticipated Outcome(s): Indep OT Basic Self-Care Anticipated Outcome(s): Supervision OT Toileting Anticipated Outcome(s): Supervision OT Bathroom Transfers Anticipated Outcome(s): Supervision OT Recommendation Recommendations for Other Services: Neuropsych consult Patient destination: Home Follow Up Recommendations: Home health OT Equipment Recommended: To be determined   Skilled Therapeutic Intervention Pt seen for OT eval and ADL bathing/dressing session. Pt in supine upon arrival, attempting to eat breakfast from flat position with plate on stomach. With encouragement pt willing to come sitting EOB to complete meal. He transferred to EOB with supervision using hospital bed functions. He ate remainder of breakfast seated EOB with supervision.  He completed stand pivot transfers with min-mod HHA overall with assist for controlled descent and VCs for safety. Transferred EOB>w/c and w/c> standard toilet with use of grab bars and assist for controlled descent. Continent void and lateral leans to complete buttock hygiene with assist for thoroughness following BM. Ambulated with HHA ~32f into shower stall. He bathed seated on tub transfer bench with min A overall with VCs for sequencing. He appeared anxious in shower beginning to have short shallow breaths, unable to elaborate on anxieties, VCs for deep breathing technique. He completed stand pivot back to w/c. He dressed  seated in w/c with steadying assist while fastening pants.  Folllowing seated rest break, he stood at sink to brush hair and complete oral care with steadying assist.  Completed ambulation trial ~15 ft with HHA, VCs for safety 2/2 impulsivity and fast walking pace. Completed x2 trials with seated rest break btwn trials. Pt returned to room at end of session, left seated in w/c with all needs in reach and NT present to don safety belt alarm. Education provided throughout session regarding role of OT, POC, OT goals, and d/c planning.   OT Evaluation Precautions/Restrictions  Precautions Precautions: Fall Restrictions Weight Bearing Restrictions: No General Chart Reviewed: Yes Pain Pain Assessment Pain Scale: 0-10 Pain Score: 0-No pain Home Living/Prior Functioning Home Living Family/patient expects to be discharged to:: Private residence Living Arrangements: Parent Available Help at Discharge: Family, Available 24 hours/day Type of Home: Other(Comment)(Townhouse) Home Access: Stairs to enter ECenter[employer] Energy[employer] Steps: 1 to 3 Entrance Stairs-Rails: None Home Layout: Two level, 1/2 bath on main level, Bed/bath upstairs, Able to live on main level with bedroom/bathroom Alternate Level Stairs-Number of Steps: reports mother sleeps on first floor usuually, Alternate Level Stairs-Rails: Right, Left Bathroom Shower/Tub: Tub/shower unit, CIndustrial/product designer Yes  Lives With: Family(Elderly mother) IADL History Leisure and Hobbies: Watching TV IADL Comments: Pt did not elaborate on IADLs/hobbies. Reports his mother did IADLs and he watched TV all day Prior Function Level of Independence: Independent with basic ADLs, Independent with transfers, Independent with homemaking with ambulation  Able to Take Stairs?: Yes Vision Baseline Vision/History: No visual deficits Patient Visual Report: No change from baseline Vision Assessment?: No apparent  visual deficits Perception  Perception: Within Functional Limits Praxis Praxis: Intact Cognition Overall Cognitive Status: Impaired/Different from baseline Arousal/Alertness: Awake/alert Orientation Level: Person;Place;Situation Person: Disoriented Place: Disoriented Situation: Disoriented Year: 2020 Month: April Day of Week: Correct Memory: Impaired Decreased Short Term Memory: Verbal complex Immediate Memory Recall: Sock;Blue;Bed Memory Recall: Sock;Blue;Bed Memory Recall Sock: Without Cue Memory Recall Blue: Without Cue Memory Recall Bed: Without Cue Attention: Focused;Sustained Focused Attention: Impaired Focused Attention Impairment: Functional basic Sustained Attention: Impaired Sustained Attention Impairment: Verbal basic;Functional complex Awareness: Impaired Awareness Impairment: Intellectual impairment Problem Solving: Impaired Problem Solving Impairment:  Functional basic;Verbal basic Executive Function: Sequencing;Self Correcting Reasoning: Impaired Reasoning Impairment: Verbal basic;Functional basic Behaviors: Impulsive Safety/Judgment: Impaired Comments: Decreased safety awareness and decreased awareness of deficits and functional implications.  Sensation Sensation Light Touch: Appears Intact Proprioception: Appears Intact Additional Comments: sensation appears intact B LEs Coordination Gross Motor Movements are Fluid and Coordinated: No Fine Motor Movements are Fluid and Coordinated: Yes Coordination and Movement Description: mild incoordination noted during functional movements/gait Heel Shin Test: [facility] bilaterallty Motor  Motor Motor: Other (comment) Motor - Skilled Clinical Observations: generalized weakness Mobility  Bed Mobility Bed Mobility: Rolling Right;Rolling Left;Supine to Sit;Sit to Supine Rolling Right: Supervision/verbal cueing Rolling Left: Supervision/Verbal cueing Supine to Sit: Supervision/Verbal cueing Sit to Supine:  Supervision/Verbal cueing Transfers Sit to Stand: Contact Guard/Touching assist Stand to Sit: Contact Guard/Touching assist  Trunk/Postural Assessment  Cervical Assessment Cervical Assessment: Within Functional Limits Thoracic Assessment Thoracic Assessment: Exceptions to WFL(kyphotic; Rounded shoulders) Lumbar Assessment Lumbar Assessment: Exceptions to WFL(Posterior pelvic tilt) Postural Control Postural Control: Deficits on evaluation(Delayed/ Insufficient)  Balance Balance Balance Assessed: Yes Static Sitting Balance Static Sitting - Balance Support: Feet supported;No upper extremity supported Static Sitting - Level of Assistance: 6: Modified independent (Device/Increase time);5: Stand by assistance Static Sitting - Comment/# of Minutes: sitting EOB to eat breakfast Dynamic Sitting Balance Dynamic Sitting - Balance Support: During functional activity;Feet supported;No upper extremity supported Dynamic Sitting - Level of Assistance: 5: Stand by assistance;4: Min assist Sitting balance - Comments: Sitting on tub bench to complete bathing task Static Standing Balance Static Standing - Balance Support: During functional activity;No upper extremity supported Static Standing - Level of Assistance: 4: Min assist Dynamic Standing Balance Dynamic Standing - Balance Support: During functional activity;No upper extremity supported Dynamic Standing - Level of Assistance: 4: Min assist;3: Mod assist Dynamic Standing - Comments: Standing to complete LB clothing management Extremity/Trunk Assessment RUE Assessment RUE Assessment: Within Functional Limits LUE Assessment LUE Assessment: Within Functional Limits     Refer to Care Plan for Long Term Goals  Recommendations for other services: Neuropsych   Discharge Criteria: Patient will be discharged from OT if patient refuses treatment 3 consecutive times without medical reason, if treatment goals not met, if there is a change in  medical status, if patient makes no progress towards goals or if patient is discharged from hospital.  The above assessment, treatment plan, treatment alternatives and goals were discussed and mutually agreed upon: by patient  Darnetta Kesselman L 03/02/2019, 10:10 AM

## 2019-03-03 ENCOUNTER — Inpatient Hospital Stay (HOSPITAL_COMMUNITY): Payer: Self-pay | Admitting: Occupational Therapy

## 2019-03-03 ENCOUNTER — Inpatient Hospital Stay (HOSPITAL_COMMUNITY): Payer: Self-pay | Admitting: Physical Therapy

## 2019-03-03 ENCOUNTER — Inpatient Hospital Stay (HOSPITAL_COMMUNITY): Payer: Self-pay

## 2019-03-03 DIAGNOSIS — I69393 Ataxia following cerebral infarction: Secondary | ICD-10-CM

## 2019-03-03 DIAGNOSIS — I639 Cerebral infarction, unspecified: Secondary | ICD-10-CM

## 2019-03-03 DIAGNOSIS — R269 Unspecified abnormalities of gait and mobility: Secondary | ICD-10-CM

## 2019-03-03 DIAGNOSIS — I69398 Other sequelae of cerebral infarction: Secondary | ICD-10-CM

## 2019-03-03 LAB — CBC WITH DIFFERENTIAL/PLATELET
Abs Immature Granulocytes: 0.02 10*3/uL (ref 0.00–0.07)
Basophils Absolute: 0.1 10*3/uL (ref 0.0–0.1)
Basophils Relative: 1 %
Eosinophils Absolute: 0.5 10*3/uL (ref 0.0–0.5)
Eosinophils Relative: 7 %
HCT: 51.6 % (ref 39.0–52.0)
Hemoglobin: 16.8 g/dL (ref 13.0–17.0)
Immature Granulocytes: 0 %
Lymphocytes Relative: 26 %
Lymphs Abs: 1.7 10*3/uL (ref 0.7–4.0)
MCH: 31 pg (ref 26.0–34.0)
MCHC: 32.6 g/dL (ref 30.0–36.0)
MCV: 95.2 fL (ref 80.0–100.0)
Monocytes Absolute: 0.5 10*3/uL (ref 0.1–1.0)
Monocytes Relative: 7 %
Neutro Abs: 3.9 10*3/uL (ref 1.7–7.7)
Neutrophils Relative %: 59 %
Platelets: 334 10*3/uL (ref 150–400)
RBC: 5.42 MIL/uL (ref 4.22–5.81)
RDW: 12.3 % (ref 11.5–15.5)
WBC: 6.6 10*3/uL (ref 4.0–10.5)
nRBC: 0 % (ref 0.0–0.2)

## 2019-03-03 LAB — COMPREHENSIVE METABOLIC PANEL
ALT: 13 U/L (ref 0–44)
AST: 17 U/L (ref 15–41)
Albumin: 3.7 g/dL (ref 3.5–5.0)
Alkaline Phosphatase: 53 U/L (ref 38–126)
Anion gap: 12 (ref 5–15)
BUN: 14 mg/dL (ref 6–20)
CO2: 28 mmol/L (ref 22–32)
Calcium: 9.6 mg/dL (ref 8.9–10.3)
Chloride: 98 mmol/L (ref 98–111)
Creatinine, Ser: 1.13 mg/dL (ref 0.61–1.24)
GFR calc Af Amer: >60 mL/min (ref >60)
GFR calc non Af Amer: >60 mL/min (ref >60)
Glucose, Bld: 130 mg/dL — ABNORMAL HIGH (ref 70–99)
Potassium: 3.5 mmol/L (ref 3.5–5.1)
Sodium: 138 mmol/L (ref 135–145)
Total Bilirubin: 0.7 mg/dL (ref 0.3–1.2)
Total Protein: 7 g/dL (ref 6.5–8.1)

## 2019-03-03 NOTE — Progress Notes (Signed)
Occupational Therapy Session Note  Patient Details  Name: Cody Porter MRN: [id number] Date of Birth: 09/23/1961  Today's Date: 03/03/2019 OT Individual Time: 1532-1600 OT Individual Time Calculation (min): 28 min    Short Term Goals: Week 1:  OT Short Term Goal 1 (Week 1): STG=LTG due to LOS  Skilled Therapeutic Interventions/Progress Updates:    Upon entering the room, pt seated in wheelchair with no c/o pain this session. Pt ambulating without use of AD 150' to stairs and utilized 1 rails for 12 steps up and then back down with CGA for balance. Pt displaying a step to pattern in both directions. Pt taking seated rest break and then engaged in dynavision task while standing on airex foam for 4 minutes with 1 LOB anteriorly. Pt catching self on dynavision and correcting. Pt utilized B UEs equally without cuing to do so and reaction time very close together for both L and R hands. Pt returning back to room in same manner with min cuing to locate room. Pt seated in wheelchair with chair alarm belt donned and call bell within reach.   Therapy Documentation Precautions:  Precautions Precautions: Fall Restrictions Weight Bearing Restrictions: No   Therapy/Group: Individual Therapy  Alen Bleacher 03/03/2019, 4:17 PM

## 2019-03-03 NOTE — Progress Notes (Signed)
Occupational Therapy Session Note  Patient Details  Name: Cody Porter MRN: [id number] Date of Birth: 1961-01-06  Today's Date: 03/03/2019 OT Individual Time: 9292-4462 and 8638-1771 OT Individual Time Calculation (min): 27 min and 73 min  Short Term Goals: Week 1:  OT Short Term Goal 1 (Week 1): STG=LTG due to LOS  Skilled Therapeutic Interventions/Progress Updates:    Pt greeted in w/c with no c/o pain. Agreeable to shower. He required vcs for self propulsion technique to sink. Pt parked sideways at sink and attempted to start shaving that way. Assist and cuing required for squaring up to sink. He completed shaving while seated with supervision/cuing for problem solving and cleaning up mess when he finished. Pt doffing and donning button up shirt with supervision as well. After handwashing, pt returned to bedside. He was left in w/c with all needs within reach and safety belt fastened. Tx focus on cognition and activity tolerance during functional tasks.    2nd Session 1:1 tx (73 min) Pt greeted in w/c with no c/o pain and declining shower. Discussed his home setup with pt stating that he slept on a mat on the floor PTA. Mom sleeps on the couch, as there is no bed. Simulated bed mat transfer in the ADL apartment, pt lowering with use of nightstand (he uses a small TV at home, about this size) onto therapy mat. Pt able to reposition himself/roll without bedrail, and then elevate himself using nightstand with steady assist (x3) and no LOB. He transferred to couch with steady assist as well during seated rest. Worked on kitchen mobility next, with pt retrieving items from fridge (including crisper drawer), pantry, and cupboard with steady assist during ambulation without device. Noted difficultly with Rt>Lt discrimination and basic instruction following. Close supervision for ambulation back into living room area to transfer to recliner. Discussed his bathroom setup, with confusion regarding if he  has a walk in or tub shower. Initially he described walk in shower, so we practiced this a few times (with steady assist-close supervision) using simulated ledge and shower chair. When he saw the tub shower in the bathroom, pt stated that this was actually his shower at home. We went to the tub shower room and he practiced side-stepping over ledge using 1 grab bar (per home), and also with TTB. Steady assist-close supervision for transfers without device. Again, pt exhibited difficultly with following novel instruction. He was able to direct OT back to his room, stating his room number. Pt then self propelled w/c to lunch cart to see if his lunch was here. Pt required instruction for recall of technique. At end of session pt washed hands at the sink and was left with all needs within reach and safety belt fastened. Tx focus placed on d/c planning, dynamic balance, and functional transfers.   Therapy Documentation Precautions:  Precautions Precautions: Fall Restrictions Weight Bearing Restrictions: No ADL:       Therapy/Group: Individual Therapy  Johnie Makki A Cylinda Santoli 03/03/2019, 12:05 PM

## 2019-03-03 NOTE — Progress Notes (Signed)
Social Work  Social Work Assessment and Plan  Patient Details  Name: Cody Porter MRN: [id number] Date of Birth: [birth year]  Today's Date: 03/03/2019  Problem List:  Patient Active Problem List   Diagnosis Date Noted  . Cerebral edema ([facility]) 02/28/2019  . Atrial fibrillation ([facility]) 02/28/2019  . Cigarette smoker 02/28/2019  . Family history of stroke 02/28/2019  . 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 ETOH abuse 01/28/2019  . Pulmonary embolus ([facility]) 01/28/2019  . Leg DVT (deep venous thromboembolism), acute, bilateral ([facility]) 01/28/2019  . Acute pulmonary embolism ([facility]) 01/28/2019   Past Medical History:  Past Medical History:  Diagnosis Date  . ETOH abuse 01/28/2019  . Leg DVT (deep venous thromboembolism), acute, bilateral ([facility]) 01/28/2019  . Pulmonary embolus ([facility]) 01/28/2019   Past Surgical History:  Past Surgical History:  Procedure Laterality Date  . HERNIA REPAIR    . IR IVC FILTER PLMT / S&I /IMG GUID/MOD SED  02/25/2019  . TONSILLECTOMY     Social History:  reports that he has been smoking cigarettes. He has been smoking about 0.75 packs per day. He has never used smokeless tobacco. He reports previous alcohol use of about 14.0 standard drinks of alcohol per week. He reports previous drug use. Drug: Marijuana.  Family / Support Systems Marital Status: Single Patient Roles: Other (Comment)(son) Other Supports: Cody Porter [phone] Anticipated Caregiver: Mom Ability/Limitations of Caregiver: Mom in fairly good health but is 59 yrs old can not provide physical assist Caregiver Availability: 24/7 Family Dynamics: Close with Mom only one left his sibling has passed away. He does have a few friends who he can count on bedsides his Mom. Pt needs to be mod/i-supervision before going home.  Social History Preferred language: English Religion: Baptist Cultural Background: No issues Education:  High School Read: Yes Write: Yes Employment Status: Disabled Date Retired/Disabled/Unemployed: 01/2019 applying for SSD and MA Legal History/Current Legal Issues: No issues Guardian/Conservator: None-according to MD pt is capable of making his own decisions while here.    Abuse/Neglect Abuse/Neglect Assessment Can Be Completed: Yes Physical Abuse: Denies Verbal Abuse: Denies Sexual Abuse: Denies Exploitation of patient/patient's resources: Denies Self-Neglect: Denies  Emotional Status Pt's affect, behavior and adjustment status: Pt is motivated to improve and regain his independence. He does not want to burden his Mom and should be the one helping her instead of her helping him. he is pleased with his progress thus far. Recent Psychosocial Issues: other health issues-recent hospitalization for PE, DVT's in 01/2019 Psychiatric History: No history deferred depression screen due to adjustin to the rehab unit. He would benefit from seeing neuro-psych while here for his coping and substance abuse issues. Will make referral for this. Substance Abuse History: ETOH and tobacco aware needs to quit both of these vices. He is planning to quit and is aware of the resources available for him to do this.  Patient / Family Perceptions, Expectations & Goals Pt/Family understanding of illness & functional limitations: Pt and Mom have a good understanding of his stroke and deficits. Both have spoken with the MD and feel they have a good understanding of his condition and treatment plan going forward. Premorbid pt/family roles/activities: son, friend, neighbor Anticipated changes in roles/activities/participation: resume Pt/family expectations/goals: Pt states:" I hope to be independent and not need help from my Mom."  Mom states: " I can help with some but can't physically help him."  [employer] Community Agencies: Other (Comment)(Used  Match in 01/2019 so not eligible now) Premorbid Home Care/DME  Agencies: None Transportation available at discharge: MOm still drives, pt did not drive prior to admission Resource referrals recommended: Neuropsychology, Support group (specify)  Discharge Planning Living Arrangements: Parent Support Systems: Parent, Friends/neighbors Type of Residence: Private residence Insurance Resources: Self-pay(Pending Medicaid) Financial Resources: Family Support Financial Screen Referred: Yes Living Expenses: Lives with family Money Management: Family Does the patient have any problems obtaining your medications?: Yes (Describe)(has no PCP on meds from past hospitalization 01/2019) Home Management: Mom pt did help with some also Patient/Family Preliminary Plans: Return home with Mom who is elderly but able to be there and does the home manangement. Pt needs to be mod/i-supervision level to go home with her. He is moving well but needs to work on his coordination and balance. while here Sw Barriers to Discharge: Decreased caregiver support, Medication compliance Sw Barriers to Discharge Comments: Mom can not provide physical care and pt has no insurance and no PCP Social Work Anticipated Follow Up Needs: HH/OP, Support Group  Clinical Impression Pleasant gentleman who is motivated to do well here and striving to be mod/i so his Mom will not ned to assist him at home. She is a support of him and can be there with him at home. She is the one that transports them to appointments. Pt has applied for SSD and MA both pending. D feel he would benefit from seeing neuro-psych while here for his coping and substance abuse issues. Will make referral.   Lucy Chris 03/03/2019, 10:54 AM

## 2019-03-03 NOTE — Progress Notes (Signed)
Physical Therapy Session Note  Patient Details  Name: Cody Porter MRN: [id number] Date of Birth: 06-Jun-1961  Today's Date: 03/03/2019 PT Individual Time: 0930-1030 PT Individual Time Calculation (min): 60 min   Short Term Goals: Week 1:  PT Short Term Goal 1 (Week 1): STG=LTG due to ELOS  Skilled Therapeutic Interventions/Progress Updates: Pt presented in bed agreeable to therapy. PTA donned TED hose and socks total A for time management. Pt performed bed mobility supine to sit supervision with use of bed features. Pt transferred to standing and ambulated to rehab gym CGA. Pt noted to have shortened step length, decreased B foot clearance,decreased trunk rotation, and decreased arm swing. Pt with no sway nor LOB during ambulation to gym. Pt participated in balance activities including toe taps with improvement in wt shifting with improved trials. Pt also participated in x 3 bouts of horse shoes with 2 bouts on level tile and last bout on Airex. Pt was able to tolerate mod challenges for reaching outside BOS while standing on Airex and maintaining balance. Pt also performed pipe tree while standing on Airex. Pt with no LOB however required assist for using correct pieces approx 50% of time. Pt also participated in gait activities including side stepping, backwards walking, and weaving through cones. Pt then ambulated through day room back to pt's room with pt noted to have improved step length and arm swing. Pt agreeable to sit in w/c after session and left with belt alarm on, call bell within reach and needs met.      Therapy Documentation Precautions:  Precautions Precautions: Fall Restrictions Weight Bearing Restrictions: No General:   Vital Signs:   Pain: Pain Assessment Pain Scale: 0-10 Pain Score: 2     Therapy/Group: Individual Therapy  Camdynn Maranto  Vashaun Osmon, PTA  03/03/2019, 1:39 PM

## 2019-03-03 NOTE — Progress Notes (Signed)
Patient information reviewed and entered into [facility] by Becky Priscella Donna, PPS coordinator. Information including medical coding, function ability, and quality indicators will be reviewed and updated through discharge.   

## 2019-03-03 NOTE — Progress Notes (Signed)
[facility] PHYSICAL MEDICINE & REHABILITATION PROGRESS NOTE   Subjective/Complaints: No issues overnite, pt denies pain.  Had pancakes for breakfast  ROS: Patient denies CP, SOB, N/V/D   Objective:   No results found. Recent Labs    02/28/19 0846 03/01/19 0440  WBC 6.5 7.5  HGB 16.3 15.8  HCT 50.0 46.7  PLT 297 294   Recent Labs    02/28/19 0846 03/01/19 0440  NA 136 136  K 3.8 3.8  CL 101 100  CO2 21* 24  GLUCOSE 111* 103*  BUN 12 12  CREATININE 0.96 0.96  CALCIUM 9.3 9.5    Intake/Output Summary (Last 24 hours) at 03/03/2019 0745 Last data filed at 03/03/2019 0158 Gross per 24 hour  Intake 720 ml  Output 850 ml  Net -130 ml     Physical Exam: Vital Signs Blood pressure (!) 146/86, pulse 79, temperature 97.8 F (36.6 C), temperature source Oral, resp. rate 18, height 5\' 8"  (1.727 m), weight 78.1 kg, SpO2 98 %. Constitutional: No distress . Vital signs reviewed. HEENT: EOMI, oral membranes moist Neck: supple Cardiovascular: RRR without murmur. No JVD    Respiratory: CTA Bilaterally without wheezes or rales. Normal effort    GI: BS +, non-tender, non-distended  Musculoskeletal:Normal range of motion.  General: No edema.  Neurological: Alert and oriented x 3. Normal language. Fair insight and awareness. Decreased fine motor coordination bilaterally with finger to nosemild Strength grossly 4/5 all 4's. Normal LT Skin: Skin iswarmand dry. He isnot diaphoretic.  Psychiatric: cooperative.    Assessment/Plan: 1. Functional deficits secondary to bilateral cerebellar hemorrhages which require 3+ hours per day of interdisciplinary therapy in a comprehensive inpatient rehab setting.  Physiatrist is providing close team supervision and 24 hour management of active medical problems listed below.  Physiatrist and rehab team continue to assess barriers to discharge/monitor patient progress toward functional and medical goals  Care Tool:  Bathing     Body parts bathed by patient: Right arm, Left upper leg, Left arm, Right lower leg, Chest, Left lower leg, Abdomen, Front perineal area, Right upper leg   Body parts bathed by helper: Buttocks     Bathing assist Assist Level: Minimal Assistance - Patient > 75%     Upper Body Dressing/Undressing Upper body dressing   What is the patient wearing?: Button up shirt    Upper body assist Assist Level: Minimal Assistance - Patient > 75%    Lower Body Dressing/Undressing Lower body dressing      What is the patient wearing?: Underwear/pull up, Pants     Lower body assist Assist for lower body dressing: Minimal Assistance - Patient > 75%     Toileting Toileting    Toileting assist Assist for toileting: Minimal Assistance - Patient > 75%     Transfers Chair/bed transfer  Transfers assist     Chair/bed transfer assist level: Minimal Assistance - Patient > 75%     Locomotion Ambulation   Ambulation assist      Assist level: Minimal Assistance - Patient > 75% Assistive device: No Device Max distance: 200 ft   Walk 10 feet activity   Assist     Assist level: Minimal Assistance - Patient > 75% Assistive device: No Device   Walk 50 feet activity   Assist    Assist level: Minimal Assistance - Patient > 75% Assistive device: No Device    Walk 150 feet activity   Assist    Assist level: Minimal Assistance - Patient > 75%  Assistive device: No Device    Walk 10 feet on uneven surface  activity   Assist     Assist level: Moderate Assistance - Patient - 50 - 74%     Wheelchair     Assist Will patient use wheelchair at discharge?: No             Wheelchair 50 feet with 2 turns activity    Assist            Wheelchair 150 feet activity     Assist          Medical Problem List and Plan: 1.Decreased functional mobilitysecondary to bilateral cerebellar and subacute left external capsule hemorrhage with surrounding  edema most likely secondary to Eliquis which has been reversed and elevated blood pressure  Cont CIR PT OT SLP 2. Antithrombotics: -DVT/anticoagulation/history of pulmonary emboli:Status Post IVC filter 02/25/2019 -antiplatelet therapy: N/A 3. Pain Management:Tylenol as needed 4. Mood:provide emotional support -antipsychotic agents: N/A 5. Neuropsych: This patientiscapable of making decisions on hisown behalf. 6. Skin/Wound Care:Routine skin checks 7. Fluids/Electrolytes/Nutrition:Routine ins and outs with follow-up chemistries 8. Hypertension. Cleviprex discontinued. Norvasc 5 mg daily, Lopressor 25 mg twice a day.   - Vitals:   03/03/19 0517 03/03/19 0742  BP: (!) 146/86 133/86  Pulse: 79 92  Resp: 18   Temp: 97.8 F (36.6 C)   SpO2: 98%   Controlled 4/20 9. VAN:VBTYOMA rate controlled 4/20  - Continue Lopressor 10. History of alcohol tobacco abuse. Provide counseling    LOS: 2 days A FACE TO FACE EVALUATION WAS PERFORMED  Erick Colace 03/03/2019, 7:45 AM

## 2019-03-03 NOTE — Evaluation (Signed)
Speech Language Pathology Assessment and Plan  Patient Details  Name: Cody Porter MRN: [id number] Date of Birth: [birth year]  SLP Diagnosis: Cognitive Impairments;Aphasia  Rehab Potential: Good ELOS: 7-9 days     Today's Date: 03/03/2019 SLP Individual Time: 0803-0900 SLP Individual Time Calculation (min): 57 min   Problem List:  Patient Active Problem List   Diagnosis Date Noted  . Cerebral edema ([place]) 02/28/2019  . Atrial fibrillation ([place]) 02/28/2019  . Cigarette smoker 02/28/2019  . Family history of stroke 02/28/2019  . ICH (intracerebral hemorrhage) ([place]) B cerebellar and L external capsule hmg while on Eliquis 02/23/2019  . ETOH abuse 01/28/2019  . Pulmonary embolus ([place]) 01/28/2019  . Leg DVT (deep venous thromboembolism), acute, bilateral ([place]) 01/28/2019  . Acute pulmonary embolism ([place]) 01/28/2019   Past Medical History:  Past Medical History:  Diagnosis Date  . ETOH abuse 01/28/2019  . Leg DVT (deep venous thromboembolism), acute, bilateral ([place]) 01/28/2019  . Pulmonary embolus ([place]) 01/28/2019   Past Surgical History:  Past Surgical History:  Procedure Laterality Date  . HERNIA REPAIR    . IR IVC FILTER PLMT / S&I /IMG GUID/MOD SED  02/25/2019  . TONSILLECTOMY      Assessment / Plan / Recommendation Clinical Impression Cody Porter is a 58 year old right-handed male with history of pulmonary emboli diagnosed in March started on Eliquis as well as history of PAF, alcohol abuse and tobacco use. Patient lives with elderlymother. Two-level home bedroom bath upstairs. Reportedly independent prior to admissionand still working. Presented for 11/02/2019 with dizziness/headache, nausea and vomiting.blood pressure was systolic in the 945W. Placed on Cleviprex for blood pressure control. Cranial CT scan showed 2 foci of acute hemorrhage within the cerebellum, largest measuring 3 cm greatest dimension. Associated edema at both sites of the hemorrhage causing local mass  effect with sulcal effacement. No midline shift or evidence of herniation. Eliquis was reversed with Andexxa. Urine drug screen negative. Hyperosmolar therapy 3% saline initiated.Most recent echocardiogram with ejection fraction of 38% normal systolic function. In regards to recent pulmonary emboli and eliquis discontinued due to hemorrhage. Interventional radiology consulted and underwent IVC placement 02/25/2019. Patient is tolerating a regular diet. Close monitoring of blood pressure. Therapy evaluations completed and patient was admitted for a comprehensive rehabilitation program.  Pt presents with mild-moderate cognitive linguistic impairment largely due to attention and recall deficits impacting basic problem solving, safety/intellectual awareness and abstract language. The above deficits supported by a score of 17 out 30 (n=>26) on MOCA version 7.3. Pt and pt's mother supports no baseline deficits, per chart review, however suspect likely supervision needed. Pt stated mother managed bill paying, medication and cooking occasionally. Pt supports his daily mainly consist of watching TV. Pt intialy denied all acute CVA deficits, however following education of assessment results agreed to cognitive changes.Pt presents with mild language impairments, in abstraction and divergent naming, possibly due to impairments in attention/recall, however due to location of CVA continue to assess language skills. Pt demonstrates ability to name items in room, express wants/needs at sentence level, response to complex yes/no questions (given extra time for processing), responive naming, divergent naming animals (8 within a minute) and simple convergent naming in 2 out 3 opportunities. Pt presents with [facility] speech and no noted impairments in swallowing per chart review/upon questioning. Pt would benefit from skilled ST services in order to maximuize functional independnece and reduce burden of care, requring 24 hour supervision  and continue ST services.    Skilled Therapeutic Interventions  Skilled ST services focused on cognitive skills. SLP administered  cognitive lingustic assessment, edcuated pt on reuslts and created plan to address deficits. SLP facilitated basic problem solving skills utilizing money management task (counting and making basic change) pt demonstrated ability to count basic change, but required max A verbal cues to make basic change with written aid provided to aid in recall. All questions were anwsered to satisifaction. Pt was left in room with call bell within reach and bed alarm set. ST recommends to continue skilled ST services.   SLP Assessment  Patient will need skilled Speech Lanaguage Pathology Services during CIR admission    Recommendations  SLP Diet Recommendations: Thin Liquid Administration via: Straw;Cup Medication Administration: Whole meds with liquid Supervision: Patient able to self feed Compensations: Minimize environmental distractions Postural Changes and/or Swallow Maneuvers: Seated upright 90 degrees Oral Care Recommendations: Oral care BID Patient destination: Home Follow up Recommendations: 24 hour supervision/assistance;Home Health SLP Equipment Recommended: None recommended by SLP    SLP Frequency 3 to 5 out of 7 days   SLP Duration  SLP Intensity  SLP Treatment/Interventions 7-9 days   Minumum of 1-2 x/day, 30 to 90 minutes  Cognitive remediation/compensation;Cueing hierarchy;Functional tasks;Speech/Language facilitation;Patient/family education    Pain Pain Assessment Pain Score: 0-No pain  Prior Functioning Cognitive/Linguistic Baseline: Within functional limits(supported by pt and mother, per chart review, however suspect supervision A) Type of Home: Other(Comment)  Lives With: Family Available Help at Discharge: Family;Available 24 hours/day Education: High school  Vocation: Unemployed  Short Term Goals: Week 1: SLP Short Term Goal 1  (Week 1): STG=LTG due to short ELOS  Refer to Care Plan for Long Term Goals  Recommendations for other services: None   Discharge Criteria: Patient will be discharged from SLP if patient refuses treatment 3 consecutive times without medical reason, if treatment goals not met, if there is a change in medical status, if patient makes no progress towards goals or if patient is discharged from hospital.  The above assessment, treatment plan, treatment alternatives and goals were discussed and mutually agreed upon: by patient  Dajha Urquilla  [facility] 03/03/2019, 4:34 PM

## 2019-03-03 NOTE — Care Management (Signed)
Inpatient Rehabilitation Center Individual Statement of Services  Patient Name:  Cody Porter  Date:  03/03/2019  Welcome to the Inpatient Rehabilitation Center.  Our goal is to provide you with an individualized program based on your diagnosis and situation, designed to meet your specific needs.  With this comprehensive rehabilitation program, you will be expected to participate in at least 3 hours of rehabilitation therapies Monday-Friday, with modified therapy programming on the weekends.  Your rehabilitation program will include the following services:  Physical Therapy (PT), Occupational Therapy (OT), Speech Therapy (ST), 24 hour per day rehabilitation nursing, Neuropsychology, Case Management (Social Worker), Rehabilitation Medicine, Nutrition Services and Pharmacy Services  Weekly team conferences will be held on Wednesday to discuss your progress.  Your Social Worker will talk with you frequently to get your input and to update you on team discussions.  Team conferences with you and your family in attendance may also be held.  Expected length of stay: 7-9 days Overall anticipated outcome: supervision with cueing  Depending on your progress and recovery, your program may change. Your Social Worker will coordinate services and will keep you informed of any changes. Your Social Worker's name and contact numbers are listed  below.  The following services may also be recommended but are not provided by the Inpatient Rehabilitation Center:    Home Health Rehabiltiation Services  Outpatient Rehabilitation Services    Arrangements will be made to provide these services after discharge if needed.  Arrangements include referral to agencies that provide these services.  Your insurance has been verified to be:  Medicaid pending Your primary doctor is:  None  Pertinent information will be shared with your doctor and your insurance company.  Social Worker:  Dossie Der, SW [phone] or  [phone]  Information discussed with and copy given to patient by: Lucy Chris, 03/03/2019, 9:53 AM

## 2019-03-04 ENCOUNTER — Inpatient Hospital Stay (HOSPITAL_COMMUNITY): Payer: Self-pay | Admitting: Occupational Therapy

## 2019-03-04 ENCOUNTER — Inpatient Hospital Stay (HOSPITAL_COMMUNITY): Payer: Self-pay

## 2019-03-04 ENCOUNTER — Inpatient Hospital Stay (HOSPITAL_COMMUNITY): Payer: Self-pay | Admitting: Physical Therapy

## 2019-03-04 ENCOUNTER — Encounter (HOSPITAL_COMMUNITY): Payer: Self-pay | Admitting: Psychology

## 2019-03-04 MED ORDER — AMLODIPINE BESYLATE 10 MG PO TABS
10.0000 mg | ORAL_TABLET | Freq: Every day | ORAL | Status: DC
  Administered 2019-03-05 – 2019-03-07 (×3): 10 mg via ORAL
  Filled 2019-03-04 (×3): qty 1

## 2019-03-04 NOTE — Progress Notes (Signed)
Occupational Therapy Session Note  Patient Details  Name: CURTIS MARKHAM MRN: [id number] Date of Birth: 01/25/1961  Today's Date: 03/04/2019 OT Individual Time: 1345-1425 OT Individual Time Calculation (min): 40 min  and Today's Date: 03/04/2019 OT Missed Time: 30 Minutes Missed Time Reason: Patient fatigue   Short Term Goals: Week 1:  OT Short Term Goal 1 (Week 1): STG=LTG due to LOS  Skilled Therapeutic Interventions/Progress Updates:    Upon entering the room, pt seated in wheelchair awaiting OT arrival. Pt reports, " I'm so tired. I don't want to do this." OT encouraged pt to take shower this session. Pt ambulating with steady assistance into the bathroom. Pt needing max cuing this session for safety awareness and mod cuing for sequencing. Pt appearing to need more direction and cuing this session than yesterday. Pt sitting on TTB for bathing with close supervision and cuing for safety. Pt continued to need cuing to sit when drying LB and donning LB clothing. Pt donning clothing from seated position on EOB. Pt requesting to return to bed secondary to fatigue. Bed alarm activated and call bell within reach upon exiting the room.   Therapy Documentation Precautions:  Precautions Precautions: Fall Restrictions Weight Bearing Restrictions: No General: General OT Amount of Missed Time: 30 Minutes Vital Signs: Therapy Vitals Temp: 97.8 F (36.6 C) Temp Source: Oral Pulse Rate: 78 Resp: 16 BP: (!) 148/80 Patient Position (if appropriate): Lying Oxygen Therapy SpO2: 100 % O2 Device: Room Air   Therapy/Group: Individual Therapy  Alen Bleacher 03/04/2019, 2:44 PM

## 2019-03-04 NOTE — Consult Note (Signed)
Neuropsychological Consultation   Patient:   Cody Porter   DOB:   01/09/1961  MR Number:  [id number]  Location:  [facility] [address] CENTER A [address] [id number] [place] [address] Dept: [phone] Loc: [phone]           Date of Service:   03/04/2019  Start Time:   3 PM End Time:   4 PM  Provider/Observer:  Arley Phenix, Psy.D.       Clinical Neuropsychologist       Billing Code/Service: [phone] 4 Units  Chief Complaint:    Cody Porter is a 58 year old male with a history of pulmonary emboli that was diagnosed in March and started on Eliquis as well as a history of PAF, alcohol abuse and tobacco use.  The patient presented  with dizziness and headache, nausea and vomiting.  The patient's blood pressure was systolic in the 180s.  The patient was placed on medications for blood pressure control.  Cranial CT scan showed 2 foci of acute hemorrhage within the cerebellum, largest measuring 3 cm greatest dimension.  There was also associated edema at both sites of hemorrhage causing local mass-effect with sulcal effacement.  There was no midline shift or evidence of herniation.  The patient has admitted in the past 2 long history of alcohol withdrawal and symptoms consistent with acute metabolic encephalopathy due to his alcohol abuse.  He has been treated with thiamine and folate in the past.  The patient was referred for the comprehensive inpatient rehabilitation program following him undergoing IVC placement on 02/25/2019.  Reason for Service:  The patient was referred for neuropsychological consultation due to coping and adjustment issues due to his recent CVA and his long history of substance abuse particularly alcohol abuse with significant alcohol abuse history with withdrawal and encephalopathy due to metabolic effects of his alcohol abuse.  Below is the HPI for the current admission.  Cody Porter is a  58 year old right-handed male with history of pulmonary emboli diagnosed in March started on Eliquis as well as history of PAF, alcohol abuse and tobacco use. Patient lives with elderlymother. Two-level home bedroom bath upstairs. Reportedly independent prior to admissionand still working. Presented for 11/02/2019 with dizziness/headache, nausea and vomiting.blood pressure was systolic in the 180s. Placed on Cleviprex for blood pressure control. Cranial CT scan showed 2 foci of acute hemorrhage within the cerebellum, largest measuring 3 cm greatest dimension. Associated edema at both sites of the hemorrhage causing local mass effect with sulcal effacement. No midline shift or evidence of herniation. Eliquis was reversed with Andexxa. Urine drug screen negative. Hyperosmolar therapy 3% saline initiated.Most recent echocardiogram with ejection fraction of 65% normal systolic function. In regards to recent pulmonary emboli and eliquis discontinued due to hemorrhage. Interventional radiology consulted and underwent IVC placement 02/25/2019. Patient is tolerating a regular diet. Close monitoring of blood pressure. Therapy evaluations completed and patient was admitted for a comprehensive rehabilitation program.  Current Status:  The patient was generally with lethargic and minimal self initiated speech and communication.  He did easily respond to specific questions.  He did appear to be oriented in general but there was still a paucity of spontaneous interaction.  The patient was generally fixated on his ability to be discharged at some point.  Behavioral Observation: Cody Porter  presents as a 57 y.o.-year-old Right Caucasian Male who appeared his stated age. his dress was Appropriate and he was Well Groomed and his  manners were Appropriate to the situation.  his participation was indicative of Appropriate, Drowsy and Inattentive behaviors.  There were any physical disabilities noted.  he displayed an  appropriate level of cooperation and motivation.     Interactions:    Minimal Appropriate, Drowsy and Inattentive  Attention:   abnormal and attention span appeared shorter than expected for age  Memory:   abnormal; remote memory intact, recent memory impaired  Visuo-spatial:  not examined  Speech (Volume):  low  Speech:   normal; slowed response time and reduced verbal fluency and spontaneity.  Thought Process:  Coherent and Relevant  Though Content:  WNL; not suicidal and not homicidal  Orientation:   person, place, time/date and situation  Judgment:   Poor  Planning:   Poor  Affect:    Blunted  Mood:    Dysphoric  Insight:   Fair  Intelligence:   normal  Substance Use:  There is a documented history of alcohol abuse confirmed by the patient.    Medical History:   Past Medical History:  Diagnosis Date  . ETOH abuse 01/28/2019  . Leg DVT (deep venous thromboembolism), acute, bilateral ([facility]) 01/28/2019  . Pulmonary embolus ([facility]) 01/28/2019    Psychiatric History:  The patient denies any significant prior psychiatric history but does acknowledge a significant history of substance abuse primarily alcohol.  Family Med/Psych History:  Family History  Family history unknown: Yes    Risk of Suicide/Violence: low the patient denies any suicidal or homicidal ideation.  Impression/DX:  Cody Porter is a 58 year old male with a history of pulmonary emboli that was diagnosed in March and started on Eliquis as well as a history of PAF, alcohol abuse and tobacco use.  The patient presented  with dizziness and headache, nausea and vomiting.  The patient's blood pressure was systolic in the 180s.  The patient was placed on medications for blood pressure control.  Cranial CT scan showed 2 foci of acute hemorrhage within the cerebellum, largest measuring 3 cm greatest dimension.  There was also associated edema at both sites of hemorrhage causing local mass-effect with sulcal  effacement.  There was no midline shift or evidence of herniation.  The patient has admitted in the past 2 long history of alcohol withdrawal and symptoms consistent with acute metabolic encephalopathy due to his alcohol abuse.  He has been treated with thiamine and folate in the past.  The patient was referred for the comprehensive inpatient rehabilitation program following him undergoing IVC placement on 02/25/2019.  The patient was generally with lethargic and minimal self initiated speech and communication.  He did easily respond to specific questions.  He did appear to be oriented in general but there was still a paucity of spontaneous interaction.  The patient was generally fixated on his ability to be discharged at some point.  Disposition/Plan:  He was rather hard to get a lot of self initiated information from the patient and while he did understand questions and answered them with appropriate responses there did appear to be some moderate difficulties with cognition.  He was remembering issues such as what he had been having for his meals as well as things that have been going on his therapy but there does appear to be some degree of reduction of self initiated behaviors and activities.  It will be important to continue to be as directive and concrete and explaining various therapeutic interventions with regard to speech/PT/OT.  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

## 2019-03-04 NOTE — Progress Notes (Signed)
Physical Therapy Session Note  Patient Details  Name: Cody Porter MRN: [id number] Date of Birth: Sep 26, 1961  Today's Date: 03/04/2019 PT Individual Time: 0805-0900 and 1200 1230  PT Individual Time Calculation (min): 55 min and 30 min  Short Term Goals: Week 1:  PT Short Term Goal 1 (Week 1): STG=LTG due to ELOS  Skilled Therapeutic Interventions/Progress Updates: Pt presented in bed agreeable to therapy. Denies pain however perseverating on notion that TED hose were removed after he had them on all night (wated to keep them on). Pt performed bed mobility with supervision and use of bed features. Pt ambulated to rehab gym CGA no AD. Participated in ascending/descending stairs x 12 with R rail only CGA with step to pattern. Pt then participated in rebounder for dynamic balance with ball. Pt was able to perform with single LE on 4in step, narrow BOS, and feet shoulder width apart all with CGA and no LOB. Pt then ambulated to ADL apt and participated in floor transfer using night stand x 4 to simulate pt's bed set up. Pt performed initially with close S then progressed to distant S with no LOB. Pt did indicate mild dizziness upon getting up which resolved with rest. Pt donned gloves requiring increased time to don R hand and on final floor transfer was able to clean mat. Pt ambulated to day room and participated in NuStep L5 x 12 min for global conditioning. Pt indicated newly onset HA, nsg notified. Pt ambulated back to room and returned to w/c with belt alarm placed, call bell within reach and needs met.   Tx2: Pt presented in [place] chair agreeable to therapy. Pt ambulated to rehab gym CGA fading to close S. Pt participated in Bi[employer] LOS on static setting with use of hand rails initially and then no UE assist. Pt required frequent verbal cues for decreased leading with shoudlers and to improve hip strategy. Pt noted to required verbal cues for instruction of activities approx 25% of time on both  trials. Pt benefited from light tactile cues for increasing wt shifting at hips vs shoulders both trials. Pt noted to have increased difficulty with wt shifting to L and posteriorly. Pt then participated in PNF diagonals with 1Kg weighted ball in standing x 10 each direction. Pt ambulated back to room and returned to w/c with belt alarm placed and needs met.      Therapy Documentation Precautions:  Precautions Precautions: Fall Restrictions Weight Bearing Restrictions: No General:   Vital Signs: Therapy Vitals Temp: 97.8 F (36.6 C) Temp Source: Oral Pulse Rate: 78 Resp: 16 BP: (!) 148/80 Patient Position (if appropriate): Lying Oxygen Therapy SpO2: 100 % O2 Device: Room Air Pain: Pain Assessment Pain Score: 0-No pain Mobility:   Locomotion :    Trunk/Postural Assessment :    Balance:   Exercises:   Other Treatments:      Therapy/Group: Individual Therapy  Tally Mattox 03/04/2019, 4:45 PM

## 2019-03-04 NOTE — Progress Notes (Signed)
[place] PHYSICAL MEDICINE & REHABILITATION PROGRESS NOTE   Subjective/Complaints:  Spoke with PT< pt req sup for mobility but cognitively having difficulty following instructions  ROS: Patient denies CP, SOB, N/V/D   Objective:   No results found. Recent Labs    03/03/19 0725  WBC 6.6  HGB 16.8  HCT 51.6  PLT 334   Recent Labs    03/03/19 0725  NA 138  K 3.5  CL 98  CO2 28  GLUCOSE 130*  BUN 14  CREATININE 1.13  CALCIUM 9.6  Bloodwork reviewed   Intake/Output Summary (Last 24 hours) at 03/04/2019 0836 Last data filed at 03/04/2019 0518 Gross per 24 hour  Intake 480 ml  Output 750 ml  Net -270 ml     Physical Exam: Vital Signs Blood pressure (!) 167/98, pulse 70, temperature 98.3 F (36.8 C), temperature source Oral, resp. rate 18, height 5\' 8"  (1.727 m), weight 78.1 kg, SpO2 98 %. Constitutional: No distress . Vital signs reviewed. HEENT: EOMI, oral membranes moist Neck: supple Cardiovascular: RRR without murmur. No JVD    Respiratory: CTA Bilaterally without wheezes or rales. Normal effort    GI: BS +, non-tender, non-distended  Musculoskeletal:Normal range of motion.  General: No edema.  Neurological: Alert and oriented x 3. Normal language. Fair insight and awareness. Decreased fine motor coordination bilaterally with finger to nosemild Strength grossly 4/5 all 4's. Normal LT Skin: Skin iswarmand dry. He isnot diaphoretic.  Psychiatric: cooperative.    Assessment/Plan: 1. Functional deficits secondary to bilateral cerebellar hemorrhages which require 3+ hours per day of interdisciplinary therapy in a comprehensive inpatient rehab setting.  Physiatrist is providing close team supervision and 24 hour management of active medical problems listed below.  Physiatrist and rehab team continue to assess barriers to discharge/monitor patient progress toward functional and medical goals  Care Tool:  Bathing  Bathing activity did not occur:  Refused Body parts bathed by patient: Right arm, Left upper leg, Left arm, Right lower leg, Chest, Left lower leg, Abdomen, Front perineal area, Right upper leg   Body parts bathed by helper: Buttocks     Bathing assist Assist Level: Minimal Assistance - Patient > 75%     Upper Body Dressing/Undressing Upper body dressing   What is the patient wearing?: Pull over shirt    Upper body assist Assist Level: Supervision/Verbal cueing    Lower Body Dressing/Undressing Lower body dressing      What is the patient wearing?: Underwear/pull up     Lower body assist Assist for lower body dressing: Minimal Assistance - Patient > 75%     Toileting Toileting    Toileting assist Assist for toileting: Minimal Assistance - Patient > 75%     Transfers Chair/bed transfer  Transfers assist     Chair/bed transfer assist level: Contact Guard/Touching assist     Locomotion Ambulation   Ambulation assist      Assist level: Contact Guard/Touching assist Assistive device: No Device Max distance: 25800ft   Walk 10 feet activity   Assist     Assist level: Contact Guard/Touching assist Assistive device: No Device   Walk 50 feet activity   Assist    Assist level: Contact Guard/Touching assist Assistive device: No Device    Walk 150 feet activity   Assist    Assist level: Contact Guard/Touching assist Assistive device: No Device    Walk 10 feet on uneven surface  activity   Assist     Assist level: Moderate Assistance -  Patient - 8 - 74%     Wheelchair     Assist Will patient use wheelchair at discharge?: No             Wheelchair 50 feet with 2 turns activity    Assist            Wheelchair 150 feet activity     Assist          Medical Problem List and Plan: 1.Decreased functional mobilitysecondary to 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 Antithrombotics: -DVT/anticoagulation/history of pulmonary emboli:Status Post IVC filter 02/25/2019 -antiplatelet therapy: N/A 3. Pain Management:Tylenol as needed 4. Mood:provide emotional support -antipsychotic agents: N/A 5. Neuropsych: This patientiscapable of making decisions on hisown behalf. 6. Skin/Wound Care:Routine skin checks 7. Fluids/Electrolytes/Nutrition:Routine ins and outs , normal BMET 8. Hypertension. Cleviprex discontinued. Norvasc 5 mg daily, Lopressor 25 mg twice a day.   - Vitals:   03/03/19 2035 03/04/19 0518  BP: (!) 152/88 (!) 167/98  Pulse: 80 70  Resp: 18 18  Temp: 98.3 F (36.8 C) 98.3 F (36.8 C)  SpO2: 96% 98%  elevated systolic  4/21 9. JQB:HALPFXT rate controlled 4/21  - Continue Lopressor 10. History of alcohol tobacco abuse. Provide counseling- may be responsible for cognitive deficits    LOS: 3 days A FACE TO FACE EVALUATION WAS PERFORMED  Erick Colace 03/04/2019, 8:36 AM

## 2019-03-04 NOTE — Progress Notes (Signed)
[place] PHYSICAL MEDICINE & REHABILITATION PROGRESS NOTE   Subjective/Complaints:  Spoke with PT< pt req sup for mobility but cognitively having difficulty following instructions  ROS: Patient denies CP, SOB, N/V/D   Objective:   No results found. Recent Labs    03/03/19 0725  WBC 6.6  HGB 16.8  HCT 51.6  PLT 334   Recent Labs    03/03/19 0725  NA 138  K 3.5  CL 98  CO2 28  GLUCOSE 130*  BUN 14  CREATININE 1.13  CALCIUM 9.6  Bloodwork reviewed   Intake/Output Summary (Last 24 hours) at 03/04/2019 1019 Last data filed at 03/04/2019 0900 Gross per 24 hour  Intake 330 ml  Output 1225 ml  Net -895 ml     Physical Exam: Vital Signs Blood pressure (!) 167/98, pulse 70, temperature 98.3 F (36.8 C), temperature source Oral, resp. rate 18, height 5\' 8"  (1.727 m), weight 78.1 kg, SpO2 98 %. Constitutional: No distress . Vital signs reviewed. HEENT: EOMI, oral membranes moist Neck: supple Cardiovascular: RRR without murmur. No JVD    Respiratory: CTA Bilaterally without wheezes or rales. Normal effort    GI: BS +, non-tender, non-distended  Musculoskeletal:Normal range of motion.  General: No edema.  Neurological: Alert and oriented x 3. Normal language. Fair insight and awareness. Decreased fine motor coordination bilaterally with finger to nosemild Strength grossly 4/5 all 4's. Normal LT Skin: Skin iswarmand dry. He isnot diaphoretic.  Psychiatric: cooperative.    Assessment/Plan: 1. Functional deficits secondary to bilateral cerebellar hemorrhages which require 3+ hours per day of interdisciplinary therapy in a comprehensive inpatient rehab setting.  Physiatrist is providing close team supervision and 24 hour management of active medical problems listed below.  Physiatrist and rehab team continue to assess barriers to discharge/monitor patient progress toward functional and medical goals  Care Tool:  Bathing  Bathing activity did not  occur: Refused Body parts bathed by patient: Right arm, Left upper leg, Left arm, Right lower leg, Chest, Left lower leg, Abdomen, Front perineal area, Right upper leg   Body parts bathed by helper: Buttocks     Bathing assist Assist Level: Minimal Assistance - Patient > 75%     Upper Body Dressing/Undressing Upper body dressing   What is the patient wearing?: Pull over shirt    Upper body assist Assist Level: Supervision/Verbal cueing    Lower Body Dressing/Undressing Lower body dressing      What is the patient wearing?: Underwear/pull up     Lower body assist Assist for lower body dressing: Minimal Assistance - Patient > 75%     Toileting Toileting    Toileting assist Assist for toileting: Minimal Assistance - Patient > 75%     Transfers Chair/bed transfer  Transfers assist     Chair/bed transfer assist level: Contact Guard/Touching assist     Locomotion Ambulation   Ambulation assist      Assist level: Contact Guard/Touching assist Assistive device: No Device Max distance: 23100ft   Walk 10 feet activity   Assist     Assist level: Contact Guard/Touching assist Assistive device: No Device   Walk 50 feet activity   Assist    Assist level: Contact Guard/Touching assist Assistive device: No Device    Walk 150 feet activity   Assist    Assist level: Contact Guard/Touching assist Assistive device: No Device    Walk 10 feet on uneven surface  activity   Assist     Assist level: Moderate Assistance -  Patient - 11 - 74%     Wheelchair     Assist Will patient use wheelchair at discharge?: No             Wheelchair 50 feet with 2 turns activity    Assist            Wheelchair 150 feet activity     Assist          Medical Problem List and Plan: 1.Decreased functional mobilitysecondary to 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 Antithrombotics: -DVT/anticoagulation/history of pulmonary emboli:Status Post IVC filter 02/25/2019 -antiplatelet therapy: N/A 3. Pain Management:Tylenol as needed 4. Mood:provide emotional support -antipsychotic agents: N/A 5. Neuropsych: This patientiscapable of making decisions on hisown behalf. 6. Skin/Wound Care:Routine skin checks 7. Fluids/Electrolytes/Nutrition:Routine ins and outs , normal BMET 8. Hypertension. Cleviprex discontinued. Norvasc 5 mg daily, Lopressor 25 mg twice a day.   - Vitals:   03/03/19 2035 03/04/19 0518  BP: (!) 152/88 (!) 167/98  Pulse: 80 70  Resp: 18 18  Temp: 98.3 F (36.8 C) 98.3 F (36.8 C)  SpO2: 96% 98%  elevated systolic  4/21 will increase norvasc 9. MEQ:ASTMHDQ rate controlled 4/21  - Continue Lopressor 10. History of alcohol tobacco abuse. Provide counseling- may be responsible for cognitive deficits    LOS: 3 days A FACE TO FACE EVALUATION WAS PERFORMED  Erick Colace 03/04/2019, 10:19 AM

## 2019-03-04 NOTE — Progress Notes (Signed)
Physical Therapy Session Note  Patient Details  Name: Cody Porter MRN: [id number] Date of Birth: 1961/08/06  Today's Date: 03/04/2019 PT Individual Time: 1015-1045 PT Individual Time Calculation (min): 30 min   Short Term Goals: Week 1:  PT Short Term Goal 1 (Week 1): STG=LTG due to ELOS  Skilled Therapeutic Interventions/Progress Updates:    Pt seated in w/c upon PT arrival, agreeable to therapy tx and denies pain. Pt ambulated to the therapy gym with min assist, no AD with occasional staggering, and pt reports mild dizziness. Pt transferred to mat and worked on habituation with bed mobility for dizziness management, Pt transferred sitting<>supine<>sidelying L<>sidelying R, multiple times transferring to each position with supervision throughout session, waiting for dizziness to subside before transferring to next position. Pt worked on dynamic standing balance to perform toe taps to colored cones, min assist without UE support Pt ambulated back to room and left in w/c with needs in reach and chair alarm set.   Therapy Documentation Precautions:  Precautions Precautions: Fall Restrictions Weight Bearing Restrictions: No    Therapy/Group: Individual Therapy  Cresenciano Genre, PT, DPT 03/04/2019, 8:00 AM

## 2019-03-04 NOTE — IPOC Note (Signed)
Overall Plan of Care [facility](IPOC) Patient Details Name: Cody Porter MRN: [id number] DOB: [birth year]  Admitting Diagnosis: ICH (intracerebral hemorrhage) [facility](HCC)  Hospital Problems: Principal Problem:   ICH (intracerebral hemorrhage) (HCC) B cerebellar and L external capsule hmg while on Eliquis     Functional Problem List: Nursing Bowel, Endurance, Medication Management, Pain, Perception, Safety, Sensory, Skin Integrity  PT Balance, Behavior, Motor, Safety, Perception  OT Balance, Pain, Cognition, Safety, Endurance, Motor  SLP Cognition  TR         Basic ADL's: OT Grooming, Bathing, Dressing, Toileting     Advanced  ADL's: OT       Transfers: PT Bed Mobility, Bed to Chair, Car, Occupational psychologisturniture  OT Toilet, Research scientist (life sciences)Tub/Shower     Locomotion: PT Ambulation, Psychologist, prison and probation servicesWheelchair Mobility, Stairs     Additional Impairments: OT None  SLP Communication, Social Cognition expression Problem Solving, Memory, Awareness, Attention  TR      Anticipated Outcomes Item Anticipated Outcome  Self Feeding Indep  Dispensing opticianwallowing      Basic self-care  Supervision  Toileting  Supervision   Bathroom Transfers Supervision  Bowel/Bladder  Manage bowel with Mod I assist.  Transfers  supervision   Locomotion  supervision   Communication  Min A , abstract naming  Cognition  Mod-Min A  Pain  Patient will state pain = or < 4/10 on a pain scale of 0-10.   Safety/Judgment  Patient will be free of falls/injury.    Therapy Plan: PT Intensity: Minimum of 1-2 x/day ,45 to 90 minutes PT Frequency: 5 out of 7 days PT Duration Estimated Length of Stay: 7-9 days OT Intensity: Minimum of 1-2 x/day, 45 to 90 minutes OT Frequency: 5 out of 7 days OT Duration/Estimated Length of Stay: 7-9 days SLP Intensity: Minumum of 1-2 x/day, 30 to 90 minutes SLP Frequency: 3 to 5 out of 7 days SLP Duration/Estimated Length of Stay: 7-9 days    Due to the current state of emergency, patients may not be receiving their 3-hours of  Medicare-mandated therapy.   Team Interventions: Nursing Interventions Patient/Family Education, Bowel Management, Disease Management/Prevention, Pain Management, Medication Management, Skin Care/Wound Management, Cognitive Remediation/Compensation, Discharge Planning, Psychosocial Support  PT interventions Ambulation/gait training, Balance/vestibular training, Cognitive remediation/compensation, Community reintegration, Discharge planning, DME/adaptive equipment instruction, Functional mobility training, Psychosocial support, Splinting/orthotics, Therapeutic Activities, UE/LE Strength taining/ROM, Visual/perceptual remediation/compensation, Disease management/prevention, Functional electrical stimulation, Neuromuscular re-education, Patient/family education, Stair training, Therapeutic Exercise, UE/LE Coordination activities  OT Interventions Balance/vestibular training, Disease mangement/prevention, Neuromuscular re-education, Patient/family education, Self Care/advanced ADL retraining, Therapeutic Exercise, UE/LE Coordination activities, UE/LE Strength taining/ROM, Therapeutic Activities, Skin care/wound managment, Psychosocial support, Pain management, Functional mobility training, DME/adaptive equipment instruction, Discharge planning, Cognitive remediation/compensation  SLP Interventions Cognitive remediation/compensation, Cueing hierarchy, Functional tasks, Speech/Language facilitation, Patient/family education  TR Interventions    SW/CM Interventions Discharge Planning, Psychosocial Support, Patient/Family Education   Barriers to Discharge MD  Medical stability, Lack of/limited family support and Medication compliance  Nursing Medication compliance    PT Inaccessible home environment, Decreased caregiver support 2 story home, elderly mother  OT Decreased caregiver support    SLP      SW Decreased caregiver support, Medication compliance Mom can not provide physical care and pt has no  insurance and no PCP   Team Discharge Planning: Destination: PT-Home ,OT- Home , SLP-Home Projected Follow-up: PT-Home health PT, OT-  Home health OT, SLP-24 hour supervision/assistance, Home Health SLP Projected Equipment Needs: PT-To be determined, OT- To be determined, SLP-None recommended by SLP Equipment Details:  PT- , OT-  Patient/family involved in discharge planning: PT- Patient,  OT-Patient, SLP-Patient  MD ELOS: 10-14d Medical Rehab Prognosis:  Good Assessment:  58 year old right-handed male with history of pulmonary emboli diagnosed in March started on Eliquis as well as history of PAF, alcohol abuse and tobacco use. Patient lives with elderlymother. Two-level home bedroom bath upstairs. Reportedly independent prior to admissionand still working. Presented for 11/02/2019 with dizziness/headache, nausea and vomiting.blood pressure was systolic in the 180s. Placed on Cleviprex for blood pressure control. Cranial CT scan showed 2 foci of acute hemorrhage within the cerebellum, largest measuring 3 cm greatest dimension. Associated edema at both sites of the hemorrhage causing local mass effect with sulcal effacement. No midline shift or evidence of herniation. Eliquis was reversed with Andexxa. Urine drug screen negative. Hyperosmolar therapy 3% saline initiated.Most recent echocardiogram with ejection fraction of 65% normal systolic function. In regards to recent pulmonary emboli and eliquis discontinued due to hemorrhage. Interventional radiology consulted and underwent IVC placement 02/25/2019. Patient is tolerating a regular diet. Close monitoring of blood pressure   Now requiring 24/7 Rehab RN,MD, as well as CIR level PT, OT and SLP.  Treatment team will focus on ADLs and mobility with goals set at Supervision See Team Conference Notes for weekly updates to the plan of care

## 2019-03-04 NOTE — Progress Notes (Signed)
Speech Language Pathology Daily Session Note  Patient Details  Name: Cody Porter MRN: [id number] Date of Birth: Mar 20, 1961  Today's Date: 03/04/2019 SLP Individual Time: 1115-1200 SLP Individual Time Calculation (min): 45 min  Short Term Goals: Week 1: SLP Short Term Goal 1 (Week 1): STG=LTG due to short ELOS  Skilled Therapeutic Interventions: Skilled ST services focused on cognitive skills. SLP facilitated basic problem solving skills utilizing picture card sequence tasks, pt demonstrated 3 step sequence in 4 out 5 opportunities min A verbal cues and 4 step sequence max A verbal cues in 4 out 4 opportunities. SLP facilitated basic problem solving, recall with visual aid and error awareness skills utilizing novel card task played at simplest level, pt required min A verbal cues for problem solving (matching), mod A verbal cues for recall of three rules and max A verbal cues for verbal error awareness ( stating incorrectly how cards matched, but able to verbally correct every time when asked to check again.) SLP educated pt to stop and think about verbal response before stating how the cards match, however unable to carryover strategy. Pt almost appeared as if he did not care to correct the response or possibly fatigued. SLP facilitated word finding in structured language tasks, pt demonstrated convergent naming skills  in 4 out 5 opportunities with min A verbal cues, and divergent naming did not increase with mod A verbal cues for use of categories (educated piror to task and throughout task) pt demonstrated ability to name 7 vegetables and articles of clothes within a minute timeframe. Pt was left in room with call bell within reach and chair alarm set. ST recommends to continue skilled ST services.      Pain Pain Assessment Pain Score: 0-No pain  Therapy/Group: Individual Therapy  Shawne Bulow  [facility] 03/04/2019, 2:50 PM

## 2019-03-05 ENCOUNTER — Inpatient Hospital Stay (HOSPITAL_COMMUNITY): Payer: Self-pay | Admitting: Physical Therapy

## 2019-03-05 ENCOUNTER — Inpatient Hospital Stay (HOSPITAL_COMMUNITY): Payer: Self-pay | Admitting: Speech Pathology

## 2019-03-05 ENCOUNTER — Inpatient Hospital Stay (HOSPITAL_COMMUNITY): Payer: Self-pay | Admitting: Occupational Therapy

## 2019-03-05 NOTE — Progress Notes (Signed)
Occupational Therapy Session Note  Patient Details  Name: Cody Porter MRN: [id number] Date of Birth: [birth year]  Today's Date: 03/05/2019 OT Individual Time: 1230-1315 OT Individual Time Calculation (min): 45 min  and Today's Date: 03/05/2019 OT Missed Time: 30 Minutes Missed Time Reason: Patient fatigue;Other (comment)(nausea)   Short Term Goals: Week 1:  OT Short Term Goal 1 (Week 1): STG=LTG due to LOS  Skilled Therapeutic Interventions/Progress Updates:    Upon entering the room, pt supine in bed upon entering the room. Pt reports fatigue but agreeable to OT intervention. Supine >sit with supervision and pt ambulating without use of AD to laundry room to check clothing at min guard for safety with balance. Clothing not dry and pt replaced with therapist assist to turn dryer back on. Pt ambulating to tub room and demonstrating ability to step over tub ledge with use of hand rail pt reports having at home with steady assistance. Pt also has chair placed in shower per report. Pt ambulating 200' back to room with supervision and then needing steady assist likely secondary to fatigue. Pt taking seated rest break and then standing with encouragement to shave face at sink with close supervision. Pt returning to bed and requesting to rest secondary to new onset of nausea. OT provided pt with ginger ale and pt declined further participation. Bed alarm activated and call bell within reach upon exiting the room.   Therapy Documentation Precautions:  Precautions Precautions: Fall Restrictions Weight Bearing Restrictions: No General: General OT Amount of Missed Time: 30 Minutes Vital Signs:  Pain: Pain Assessment Pain Scale: 0-10 Pain Score: 0-No pain   Therapy/Group: Individual Therapy  Alen Bleacher 03/05/2019, 1:43 PM

## 2019-03-05 NOTE — Progress Notes (Signed)
Physical Therapy Session Note  Patient Details  Name: Cody Porter MRN: [id number] Date of Birth: [birth year]  Today's Date: 03/05/2019 PT Individual Time: 0805-0915 PT Individual Time Calculation (min): 70 min   Short Term Goals: Week 1:  PT Short Term Goal 1 (Week 1): STG=LTG due to ELOS  Skilled Therapeutic Interventions/Progress Updates: Pt presented in bed agreeable to therapy. Pt requesting Tylenol due to HA, nsg notified and administered during tx. Pt performed bed mobility supervision level and ambulated to rehab gym CGA to close S. Pt participated in toe taps to target on 8in step with CGA and improving coordination. Performed static standing on red wedge for heel cord stretch and performed shoulder flexion to 90degrees for balance. Pt also participated in 2 bout of horseshoes while standing on red wedge.  Pt also participated in agility ladder exercises side stepping and alternating steps over rungs. Pt required mod cues for tasks due to cognitive deficits and compensatory stepping. Pt ambulated to day room and participated in NuStep L5 x 10 min for endurance and global conditioning. Pt and PTA discussed risk factors and increased risk CVA in 55mowindow. Pt ambulated back to rehab gym close S and performed Zoom ball horizontal and vertical as well as picking up cups from floor. After floor activity pt with increased D/N taking approx 5 min to resolve. Pt ambulated back to room close S and returned to bed per pt request. Pt left in bed with bel alarm on, call bell within reach and needs met.      Therapy Documentation Precautions:  Precautions Precautions: Fall Restrictions Weight Bearing Restrictions: No General:   Vital Signs:  Pain: Pain Assessment Pain Scale: 0-10 Pain Score: 0-No pain   Therapy/Group: Individual Therapy  Belenda Alviar  Rashanna Christiana, PTA  03/05/2019, 1:35 PM

## 2019-03-05 NOTE — Plan of Care (Signed)
  Problem: Consults Goal: RH STROKE PATIENT EDUCATION Description See Patient Education module for education specifics  Outcome: Progressing Goal: Nutrition Consult-if indicated Outcome: Progressing   Problem: RH BOWEL ELIMINATION Goal: RH STG MANAGE BOWEL WITH ASSISTANCE Description STG Manage Bowel with Mod I Assistance.  Outcome: Progressing Goal: RH STG MANAGE BOWEL W/MEDICATION W/ASSISTANCE Description STG Manage Bowel with Medication with Mod I Assistance.  Outcome: Progressing   Problem: RH BLADDER ELIMINATION Goal: RH STG MANAGE BLADDER WITH ASSISTANCE Description STG Manage Bladder With Mod I Assistance  Outcome: Progressing   Problem: RH SKIN INTEGRITY Goal: RH STG SKIN FREE OF INFECTION/BREAKDOWN Description No new breakdown with mod I assist   Outcome: Progressing Goal: RH STG MAINTAIN SKIN INTEGRITY WITH ASSISTANCE Description STG Maintain Skin Integrity With Mod I Assistance.  Outcome: Progressing   Problem: RH SAFETY Goal: RH STG ADHERE TO SAFETY PRECAUTIONS W/ASSISTANCE/DEVICE Description STG Adhere to Safety Precautions With Mod I Assistance/Device.  Outcome: Progressing   Problem: RH COGNITION-NURSING Goal: RH STG ANTICIPATES NEEDS/CALLS FOR ASSIST W/ASSIST/CUES Description STG Anticipates Needs/Calls for Assist With Mod I Assistance/Cues.  Outcome: Progressing   Problem: RH PAIN MANAGEMENT Goal: RH STG PAIN MANAGED AT OR BELOW PT'S PAIN GOAL Description <3 out of 10.   Outcome: Progressing   Problem: RH KNOWLEDGE DEFICIT Goal: RH STG INCREASE KNOWLEDGE OF HYPERTENSION Description Pt will be able to verbalize 2 self care measures to manage blood pressure at home at the time of discharge with min cues.   Outcome: Progressing Goal: RH STG INCREASE KNOWLEDGE OF STROKE PROPHYLAXIS Description Pt will be able to verbalize stroke prevention techniques upon discharge with min cues.   Outcome: Progressing

## 2019-03-05 NOTE — Patient Care Conference (Signed)
Inpatient RehabilitationTeam Conference and Plan of Care Update Date: 03/05/2019   Time: 10:30 AM    Patient Name: Cody Porter      Medical Record Number: [id number]  Date of Birth: [birth year] Sex: Male         Room/Bed: 4W10C/4W10C-01 Payor Info: Payor: MEDICAID POTENTIAL / Plan: MEDICAID POTENTIAL / Product Type: *No Product type* /    Admitting Diagnosis: stroke  Admit Date/Time:  03/01/2019  3:12 PM Admission Comments: No comment available   Primary Diagnosis:  ICH (intracerebral hemorrhage) (HCC) Principal Problem: ICH (intracerebral hemorrhage) (HCC)  Patient Active Problem List   Diagnosis Date Noted  . Cerebral edema (HCC) 02/28/2019  . Atrial fibrillation (HCC) 02/28/2019  . Cigarette smoker 02/28/2019  . Family history of stroke 02/28/2019  . 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 ETOH abuse 01/28/2019  . Pulmonary embolus (HCC) 01/28/2019  . Leg DVT (deep venous thromboembolism), acute, bilateral (HCC) 01/28/2019  . Acute pulmonary embolism (HCC) 01/28/2019    Expected Discharge Date: Expected Discharge Date: 03/08/19  Team Members Present: Physician leading conference: Dr. Claudette Laws Social Worker Present: Dossie Der, LCSW Nurse Present: Doran Durand, LPN PT Present: Harless Litten, PTA;Midge Minium, PT OT Present: Jackquline Denmark, OT SLP Present: Other (comment)(Celia Buethe & Meagon Carr-SPT) PPS Coordinator present : Fae Pippin     Current Status/Progress Goal Weekly Team Focus  Medical   HA relieved with tylenol, poor sleep  Improve balance, reduce fall risk      Bowel/Bladder   Pt is continent B/B. LBM 03/02/2019.  Maintain regular bowel pattern.  Assist with toileting needs PRN.    Swallow/Nutrition/ Hydration             ADL's   min guard - min A for self care and functional ambulation  supervision overall  endurance, strength, balance, self care retraining, safety  awareness   Mobility   supervision bed mobility, supervision floor transfer, CGA stairs, CGA gait no AD  supervision overall  higher level balance,d/c planning    Communication   Mod A  Min A  Compensatory word finding strategies, divergent naming   Safety/Cognition/ Behavioral Observations  Mod to Max A  Min A  Functional problem solving, external compensatory memory strategies   Pain   Pt complains of pain of 5 to posterior head manifesting as headache. Pain managed with Tylenol.  Pain < 2  Assess pai Q shift and PRN.   Skin   No skin issues.  Maintain skin integrity and prevent skin breakdown.  Assess skin Q shift and PRN.      *See Care Plan and progress notes for long and short-term goals.     Barriers to Discharge  Current Status/Progress Possible Resolutions Date Resolved   Physician    Medical stability;Lack of/limited family support     progressing well  Working on BP prior to D/C      Nursing  Medication compliance               PT  Inaccessible home environment;Decreased caregiver support  2 story home, elderly mother              OT Decreased caregiver support                SLP                SW Decreased caregiver support;Medication compliance Mom can not provide physical care and  pt has no insurance and no PCP            Discharge Planning/Teaching Needs:  Home with elderly Mom who can provide supervision level, pt wants to go home soon.      Team Discussion:  Goals supervision level getting close  To this level. He is making progress. MD addressing BP's-adjusting meds. Headache being managed by tylenol. Delayed in his responses per Speech-getting better. Sleeping better. Ready for DC Sat  Revisions to Treatment Plan:  DC 4/25    Continued Need for Acute Rehabilitation Level of Care: The patient requires daily medical management by a physician with specialized training in physical medicine and rehabilitation for the following conditions: Daily direction of  a multidisciplinary physical rehabilitation program to ensure safe treatment while eliciting the highest outcome that is of practical value to the patient.: Yes Daily medical management of patient stability for increased activity during participation in an intensive rehabilitation regime.: Yes Daily analysis of laboratory values and/or radiology reports with any subsequent need for medication adjustment of medical intervention for : Mood/behavior problems;Neurological problems   I attest that I was present, lead the team conference, and concur with the assessment and plan of the team. Teleconference held due to COVID-19   Lucy ChrisDupree, Carlus Stay G 03/05/2019, 12:06 PM

## 2019-03-05 NOTE — Progress Notes (Signed)
Speech Language Pathology Daily Session Note  Patient Details  Name: Cody Porter MRN: [id number] Date of Birth: 10/16/1961  Today's Date: 03/05/2019 SLP Individual Time: 2119-4174 SLP Individual Time Calculation (min): 30 min  Short Term Goals: Week 1: SLP Short Term Goal 1 (Week 1): STG=LTG due to short ELOS  Skilled Therapeutic Interventions: Patient received skilled SLP services targeting cognitive deficits. Patient with limited participation this date stating "Lets get this over with". Patient participated in abstract divergent naming task identifying 10 items in each of 3 categories following mod semantic cues. Patient responded to functional problem solving scenarios for the home environment with 50% accuracy requiring max verbal cues. Patient participated in immediate recall task for newspaper advertisements with 40% accuracy requiring max verbal cues. At end of therapy session patient upright in bed, call bell within reach, and bed alarm activated. Recommend continue current POC.  Pain Pain Assessment Pain Scale: 0-10 Pain Score: 0-No pain  Therapy/Group: Individual Therapy  Arnette Schaumann, MS, CCC-SLP 03/05/2019, 12:05 PM

## 2019-03-05 NOTE — Progress Notes (Signed)
[facility] PHYSICAL MEDICINE & REHABILITATION PROGRESS NOTE   Subjective/Complaints:  Pt alert , oriented x 3 , did not sleep well, denies pain, or bowel or bladder issues at night   ROS: Patient denies CP, SOB, N/V/D   Objective:   No results found. Recent Labs    03/03/19 0725  WBC 6.6  HGB 16.8  HCT 51.6  PLT 334   Recent Labs    03/03/19 0725  NA 138  K 3.5  CL 98  CO2 28  GLUCOSE 130*  BUN 14  CREATININE 1.13  CALCIUM 9.6  Bloodwork reviewed   Intake/Output Summary (Last 24 hours) at 03/05/2019 5883 Last data filed at 03/05/2019 0700 Gross per 24 hour  Intake 570 ml  Output 125 ml  Net 445 ml     Physical Exam: Vital Signs Blood pressure (!) 167/90, pulse 79, temperature 98 F (36.7 C), temperature source Oral, resp. rate 18, height _0  (1.727 m), weight 78.2 kg, SpO2 98 %. Constitutional: No distress . Vital signs reviewed. HEENT: EOMI, oral membranes moist Neck: supple Cardiovascular: RRR without murmur. No JVD    Respiratory: CTA Bilaterally without wheezes or rales. Normal effort    GI: BS +, non-tender, non-distended  Musculoskeletal:Normal range of motion.  General: No edema.  Neurological: Alert and oriented x 3. Normal language. Fair insight and awareness. Decreased fine motor coordination bilaterally with finger to nosemild Strength grossly 4/5 all 4's. Normal LT Skin: Skin iswarmand dry. He isnot diaphoretic.  Psychiatric: cooperative.    Assessment/Plan: 1. Functional deficits secondary to bilateral cerebellar hemorrhages which require 3+ hours per day of interdisciplinary therapy in a comprehensive inpatient rehab setting.  Physiatrist is providing close team supervision and 24 hour management of active medical problems listed below.  Physiatrist and rehab team continue to assess barriers to discharge/monitor patient progress toward functional and medical goals  Care Tool:  Bathing  Bathing activity did not occur:  Refused Body parts bathed by patient: Right arm, Left upper leg, Left arm, Right lower leg, Chest, Left lower leg, Abdomen, Front perineal area, Right upper leg, Buttocks   Body parts bathed by helper: Buttocks     Bathing assist Assist Level: Minimal Assistance - Patient > 75%     Upper Body Dressing/Undressing Upper body dressing   What is the patient wearing?: Pull over shirt    Upper body assist Assist Level: Set up assist    Lower Body Dressing/Undressing Lower body dressing      What is the patient wearing?: Underwear/pull up, Pants     Lower body assist Assist for lower body dressing: Minimal Assistance - Patient > 75%     Toileting Toileting    Toileting assist Assist for toileting: Minimal Assistance - Patient > 75%     Transfers Chair/bed transfer  Transfers assist     Chair/bed transfer assist level: Contact Guard/Touching assist     Locomotion Ambulation   Ambulation assist      Assist level: Minimal Assistance - Patient > 75% Assistive device: No Device Max distance: 20'   Walk 10 feet activity   Assist     Assist level: Minimal Assistance - Patient > 75% Assistive device: No Device   Walk 50 feet activity   Assist    Assist level: Minimal Assistance - Patient > 75% Assistive device: No Device    Walk 150 feet activity   Assist    Assist level: Minimal Assistance - Patient > 75% Assistive device: No Device  Walk 10 feet on uneven surface  activity   Assist     Assist level: Moderate Assistance - Patient - 50 - 74%     Wheelchair     Assist Will patient use wheelchair at discharge?: No             Wheelchair 50 feet with 2 turns activity    Assist            Wheelchair 150 feet activity     Assist          Medical Problem List and Plan: 1.Decreased functional mobilitysecondary to bilateral cerebellar and subacute left external capsule hemorrhage with surrounding edema most  likely secondary to Eliquis which has been reversed and elevated blood pressure  Cont CIR PT OT SLP Team conference today please see physician documentation under team conference tab, met with team face-to-face to discuss problems,progress, and goals. Formulized individual treatment plan based on medical history, underlying problem and comorbidities. 2. Antithrombotics: -DVT/anticoagulation/history of pulmonary emboli:Status Post IVC filter 02/25/2019 -antiplatelet therapy: N/A 3. Pain Management:Tylenol as needed 4. Mood:provide emotional support -antipsychotic agents: N/A 5. Neuropsych: This patientiscapable of making decisions on hisown behalf. 6. Skin/Wound Care:Routine skin checks 7. Fluids/Electrolytes/Nutrition:Routine ins and outs , normal BMET 8. Hypertension. Cleviprex discontinued. Norvasc 5 mg daily, Lopressor 25 mg twice a day.   - Vitals:   03/04/19 1958 03/05/19 0504  BP: (!) 160/89 (!) 167/90  Pulse: 84 79  Resp: 18 18  Temp: 98.2 F (36.8 C) 98 F (36.7 C)  SpO2: 99% 67%  elevated systolic will increase norvasc 71m starting 4/22 9. PWPT:YYPEJYLrate controlled 4/22  - Continue Lopressor 10. History of alcohol tobacco abuse. Provide counseling- may be responsible for cognitive deficits    LOS: 4 days A FACE TO FACE EVALUATION WAS PERFORMED  ACharlett Blake4/22/2020, 8:22 AM

## 2019-03-05 NOTE — Progress Notes (Signed)
Social Work Patient ID: Cody Porter, male   DOB: [birth year], 58 y.o.   MRN: [id number] Met with pt and spoke with Mom via telephone to inform team conference goals supervision level and discharge date of 4/25. Both feel relieved he is going home soon. Agreeable to follow up and has no equipment needs. Work toward discharge Sat.

## 2019-03-06 ENCOUNTER — Inpatient Hospital Stay (HOSPITAL_COMMUNITY): Payer: Self-pay | Admitting: Occupational Therapy

## 2019-03-06 ENCOUNTER — Inpatient Hospital Stay (HOSPITAL_COMMUNITY): Payer: Self-pay

## 2019-03-06 ENCOUNTER — Inpatient Hospital Stay (HOSPITAL_COMMUNITY): Payer: Self-pay | Admitting: Physical Therapy

## 2019-03-06 ENCOUNTER — Inpatient Hospital Stay (HOSPITAL_COMMUNITY): Payer: Self-pay | Admitting: Speech Pathology

## 2019-03-06 MED ORDER — ONDANSETRON HCL 4 MG PO TABS
4.0000 mg | ORAL_TABLET | Freq: Once | ORAL | Status: AC
  Administered 2019-03-06: 4 mg via ORAL
  Filled 2019-03-06: qty 1

## 2019-03-06 NOTE — Progress Notes (Signed)
Occupational Therapy Discharge Summary  Patient Details  Name: ANTRONE WALLA MRN: [id number] Date of Birth: September 08, 1961  Today's Date: 03/06/2019 OT Individual Time: 1500-1555 OT Individual Time Calculation (min): 55 min    Patient has met 11 of 11 long term goals due to improved activity tolerance, improved balance, postural control, ability to compensate for deficits, improved attention, improved awareness and improved coordination.  Patient to discharge at overall Supervision level.  Pt's mother will provide 24/7 S at home.  Reasons goals not met: all goals met  Recommendation:  No follow up needed at discharge  Equipment: No equipment provided  Reasons for discharge: treatment goals met  Patient/family agrees with progress made and goals achieved: Yes   OT Intervention: Upon entering the room, pt supine in bed sleeping soundly but agreeable to OT intervention. Pt ambulating without use of AD and close supervision to laundry room to obtain clothing items. Pt carrying items back to room without LOB. Pt seated on commode to remove clothing items with supervision. Pt transferred onto TTB in shower and bathing from seated position with overall supervision. Pt dying self and ambulating to sit on EOB to don clothing item with overall supervision. Pt needing min cuing for safety awareness. OT discussed not need for OT follow up at discharge and pt verbalized understanding and agreement.   OT Discharge Precautions/Restrictions  Precautions Precautions: Fall Restrictions Weight Bearing Restrictions: No Vital Signs Therapy Vitals Temp: 98 F (36.7 C) Temp Source: Oral Pulse Rate: 75 Resp: (!) 24 BP: 135/82 Patient Position (if appropriate): Lying Oxygen Therapy SpO2: 95 % O2 Device: Room Air Pain Pain Assessment Pain Scale: 0-10 Pain Score: 6  Pain Type: Acute pain Pain Location: Head Pain Orientation: Posterior Pain Descriptors / Indicators:  Aching;Discomfort;Headache Pain Onset: Gradual Patients Stated Pain Goal: 2 Pain Intervention(s): RN made aware;Repositioned Vision Baseline Vision/History: No visual deficits Patient Visual Report: No change from baseline Vision Assessment?: No apparent visual deficits Perception  Perception: Within Functional Limits Praxis Praxis: Intact Cognition Overall Cognitive Status: Within Functional Limits for tasks assessed Arousal/Alertness: Awake/alert Orientation Level: Oriented X4 Sensation Sensation Light Touch: Appears Intact Proprioception: Appears Intact Coordination Gross Motor Movements are Fluid and Coordinated: No Fine Motor Movements are Fluid and Coordinated: Yes Motor  Motor Motor: Other (comment) Motor - Discharge Observations: generalized weakness Mobility  Bed Mobility Bed Mobility: Rolling Right;Rolling Left;Supine to Sit;Sit to Supine Rolling Right: Supervision/verbal cueing Rolling Left: Supervision/Verbal cueing Supine to Sit: Supervision/Verbal cueing Sit to Supine: Supervision/Verbal cueing Transfers Sit to Stand: Supervision/Verbal cueing Stand to Sit: Supervision/Verbal cueing  Trunk/Postural Assessment  Cervical Assessment Cervical Assessment: Within Functional Limits Thoracic Assessment Thoracic Assessment: Exceptions to WFL(rounded shoulders) Lumbar Assessment Lumbar Assessment: Exceptions to WFL(posterior pelvic tilt)  Balance Balance Balance Assessed: Yes Standardized Balance Assessment Standardized Balance Assessment: Dynamic Gait Index Static Sitting Balance Static Sitting - Balance Support: Feet supported;No upper extremity supported Static Sitting - Level of Assistance: 6: Modified independent (Device/Increase time) Dynamic Sitting Balance Dynamic Sitting - Balance Support: During functional activity;Feet supported;No upper extremity supported Dynamic Sitting - Level of Assistance: 6: Modified independent (Device/Increase  time) Static Standing Balance Static Standing - Balance Support: During functional activity;No upper extremity supported Static Standing - Level of Assistance: 5: Stand by assistance Dynamic Standing Balance Dynamic Standing - Balance Support: During functional activity;No upper extremity supported Dynamic Standing - Level of Assistance: 5: Stand by assistance Extremity/Trunk Assessment RUE Assessment RUE Assessment: Within Functional Limits LUE Assessment LUE Assessment: Within Functional Limits   Berit Raczkowski,  Thaine Garriga P 03/06/2019, 3:34 PM

## 2019-03-06 NOTE — Progress Notes (Addendum)
Social Work Patient ID: Cody Porter, male   DOB: 22-Jul-1961, 58 y.o.   MRN: [id number] According to MD pt is being discharged tomorrow. Will let team and Dan-PA. Spoke with Mom and pt agreeable to this plan.

## 2019-03-06 NOTE — Progress Notes (Signed)
Speech Language Pathology Discharge Summary  Patient Details  Name: Cody Porter MRN: [id number] Date of Birth: 1961/04/30  Today's Date: 03/06/2019 SLP Individual Time: 0930-1030 SLP Individual Time Calculation (min): 60 min   Skilled Therapeutic Interventions:  Skilled treatment session focused on cognition goals. SLP facilitiated session by providing supervision cues to solve hypothetical anticipatory awareness problems. At baseline, pt is sedentary with his mother doing all activities. Pt is currently at baseline.     Patient has met 5 of 5 long term goals.  Patient to discharge at overall Mod level.    Clinical Impression/Discharge Summary:   Pt is at baseline and as such he requires Mod A by his other (prior to this admission) d/t ETOH use and decreased desire to perform tasks. No further skilled ST is indicated for follow up.  Care Partner:  Caregiver Able to Provide Assistance: Yes  Type of Caregiver Assistance: Physical;Cognitive  Recommendation:  24 hour supervision/assistance;Home Health SLP        Reasons for discharge: Discharged from hospital   Patient/Family Agrees with Progress Made and Goals Achieved: Yes    Avrielle Fry 03/06/2019, 4:23 PM

## 2019-03-06 NOTE — Discharge Summary (Signed)
Physician Discharge Summary  Patient ID: Cody Porter MRN: [id number] DOB/AGE: 59-01-1961 59 y.o.  Admit date: 03/01/2019 Discharge date: 03/07/2019  Discharge Diagnoses:  Principal Problem:   ICH (intracerebral hemorrhage) (HCC) B cerebellar and L external capsule hmg while on Eliquis DVT prophylaxis with pulmonary emboli Hypertension PAF History of alcohol tobacco use Discharged Condition: stable  Significant Diagnostic Studies: Ct Angio Head W Or Wo Contrast  Result Date: 02/23/2019 CLINICAL DATA:  58 y/o  M; cerebral hemorrhage. EXAM: CT ANGIOGRAPHY HEAD TECHNIQUE: Multidetector CT imaging of the head was performed using the standard protocol during bolus administration of intravenous contrast. Multiplanar CT image reconstructions and MIPs were obtained to evaluate the vascular anatomy. CONTRAST:  OMNIPAQUE IOHEXOL 350 MG/ML SOLN COMPARISON:  02/23/2019 CT head. FINDINGS: CTA HEAD Anterior circulation: No significant stenosis, proximal occlusion, aneurysm, or vascular malformation. Mild non stenotic calcific atherosclerosis of carotid siphons. Posterior circulation: No significant stenosis, proximal occlusion, aneurysm, or vascular malformation. Venous sinuses: As permitted by contrast timing, patent. Anatomic variants: Large left A1 and anterior communicating artery, small right A1, normal variant. Small right and probable diminutive left posterior communicating arteries. Delayed phase: No abnormal intracranial enhancement. IMPRESSION: 1. Patent anterior and posterior intracranial circulation. No large vessel occlusion, aneurysm, vascular malformation, or significant stenosis. 2. Stable acute hematoma within the bilateral cerebellar hemispheres and subacute hemorrhage within the left external capsule. No active extravasation or enhancing lesion identified. Electronically Signed   By: Mitzi Hansen M.D.   On: 02/23/2019 20:30   Ct Head Wo Contrast  Result Date:  02/24/2019 CLINICAL DATA:  58 y/o  M; intracranial hemorrhage for follow-up EXAM: CT HEAD WITHOUT CONTRAST TECHNIQUE: Contiguous axial images were obtained from the base of the skull through the vertex without intravenous contrast. COMPARISON:  02/23/2019 CT head and CTA head. FINDINGS: Brain: Acute hemorrhage within right cerebellar hemisphere is stable measuring 31 x 24 mm in axial plane (series 3, image 8) and up to 44 mm in coronal plane (series 5, image 52). Acute hemorrhage in left cerebellar hemisphere is stable measuring 23 x 18 mm in axial plane (series 3, image 9). Stable subtle density in left external capsule compatible with subacute hemorrhage. Mass effect from the hematomas within the cerebellum well as associated vasogenic edema results in mild mass effect within the posterior fossa partially effacing the fourth ventricle. No associated hydrocephalus. No new hemorrhage, extra-axial collection, mass effect, stroke, or herniation is identified. Stable nonspecific white matter hypodensities compatible with chronic microvascular ischemic changes and stable volume loss of the brain. Stable small chronic lacunar infarcts within the bilateral thalami and putamen. Vascular: Calcific atherosclerosis of the carotid siphons. No hyperdense vessel identified. Skull: Normal. Negative for fracture or focal lesion. Sinuses/Orbits: Mild mucosal thickening of the sphenoid sinus and ethmoid air cells. Additional visible paranasal sinuses and the mastoid air cells are normally aerated. Orbits are unremarkable. Other: None. IMPRESSION: Stable acute hemorrhage within the bilateral cerebellar hemispheres and subacute hemorrhage within the left external capsule. Stable associated vasogenic edema and mild local mass effect. No new acute intracranial abnormality identified. Electronically Signed   By: Mitzi Hansen M.D.   On: 02/24/2019 00:27   Ct Head Wo Contrast  Result Date: 02/23/2019 CLINICAL DATA:  Emesis  and dizziness since this morning. EXAM: CT HEAD WITHOUT CONTRAST TECHNIQUE: Contiguous axial images were obtained from the base of the skull through the vertex without intravenous contrast. COMPARISON:  Head CT dated 01/31/2019. FINDINGS: Brain: 2 foci of acute hemorrhage within the cerebellum.  The cerebellar hemorrhage to the RIGHT of midline measures 3.1 x 2.7 cm. The smaller cerebellar hemorrhage to the LEFT of midline measures 2.3 x 1.8 cm. Edema surrounds both sites of hemorrhage with associated local mass effect (sulcal effacement). No midline shift or herniation. Questionable small acute to subacute hemorrhage within the LEFT external capsule. No additional parenchymal hemorrhage. No extra-axial hemorrhage. No hydrocephalus. Mild chronic small vessel ischemic changes within the deep periventricular white matter regions bilaterally. Vascular: No hyperdense vessel or unexpected calcification. Skull: Normal. Negative for fracture or focal lesion. Sinuses/Orbits: No acute finding. Other: None. IMPRESSION: 1. Two foci of acute hemorrhage within the cerebellum, largest measuring 3 cm greatest dimension. Associated edema at both sites of hemorrhage causing local mass effect with sulcal effacement. No midline shift or evidence of herniation. Distribution suggest hypertensive hemorrhage, possibly complicated by recent commencement of Eliquis. 2. Small acute to subacute hemorrhage within the LEFT external capsule. 3. Mild chronic small vessel ischemic changes within the periventricular white matter. Critical Value/emergent results were called by telephone at the time of interpretation on 02/23/2019 at 6:20 pm to Dr. Leonia CoronaORTNI COUTURE , who verbally acknowledged these results. Electronically Signed   By: Bary RichardStan  Maynard M.D.   On: 02/23/2019 18:23   Mr Laqueta JeanBrain W AOWo Contrast  Result Date: 02/24/2019 CLINICAL DATA:  Intracranial hemorrhage follow up EXAM: MRI HEAD WITHOUT AND WITH CONTRAST TECHNIQUE: Multiplanar, multiecho  pulse sequences of the brain and surrounding structures were obtained without and with intravenous contrast. CONTRAST:  7.5 mL Gadavist COMPARISON:  Head CT 02/24/2019 FINDINGS: BRAIN: There are magnetic susceptibility effects corresponding to known hemorrhage within both cerebellar hemispheres and within the left posterior basal ganglia/external capsule. There is chronic microhemorrhage evident in the right thalamus. This obscures diffusion analysis. The midline structures are normal. Mass effect in the posterior fossa is unchanged. No midline shift or herniation. The size of the cerebellar hematomas is unchanged. Multifocal white matter hyperintensity, most commonly due to chronic ischemic microangiopathy. Advanced atrophy for age. No abnormal contrast enhancement. VASCULAR: The major intracranial arterial and venous sinus flow voids are normal. SKULL AND UPPER CERVICAL SPINE: Calvarial bone marrow signal is normal. There is no skull base mass. Visualized upper cervical spine and soft tissues are normal. SINUSES/ORBITS: No fluid levels or advanced mucosal thickening. No mastoid or middle ear effusion. The orbits are normal. IMPRESSION: 1. Unchanged size of intraparenchymal hematomas within both cerebellar hemispheres, right greater than left. 2. Unchanged size of small intraparenchymal hematoma at the junction of the posterior left basal ganglia and external capsule. 3. Mild mass effect on the posterior fossa without hydrocephalus. No supratentorial midline shift or herniation. 4. Mild chronic small vessel disease and age advanced atrophy. Electronically Signed   By: Deatra RobinsonKevin  Herman M.D.   On: 02/24/2019 13:59   Ir Ivc Filter Plmt / S&i /img Guid/mod Sed  Result Date: 02/25/2019 INDICATION: 58 year old with history of bilateral pulmonary emboli and recent intracranial hemorrhage. Patient needs to stop anticoagulation and needs pulmonary embolism prophylaxis. EXAM: IVC FILTER PLACEMENT; IVC VENOGRAM; ULTRASOUND  FOR VASCULAR ACCESS Physician: Rachelle HoraAdam R. Lowella DandyHenn, MD MEDICATIONS: None. ANESTHESIA/SEDATION: Fentanyl 25 mcg IV; Versed 1.0 mg IV Moderate Sedation Time:  17 minutes The patient was continuously monitored during the procedure by the interventional radiology nurse under my direct supervision. CONTRAST:  40 mL Omnipaque 300 FLUOROSCOPY TIME:  Fluoroscopy Time: 1 minutes, 12 seconds, 28 mGy COMPLICATIONS: None immediate. PROCEDURE: The procedure was explained to the patient. The risks and benefits of the procedure were  discussed and the patient's questions were addressed. Informed consent was obtained from the patient. Ultrasound demonstrated a patent right internal jugular vein. Ultrasound images were obtained for documentation. The right side of the neck was prepped and draped in a sterile fashion. Maximal barrier sterile technique was utilized including caps, mask, sterile gowns, sterile gloves, sterile drape, hand hygiene and skin antiseptic. The skin was anesthetized with 1% lidocaine. A 21 gauge needle was directed into the vein with ultrasound guidance and a micropuncture dilator set was placed. A wire was advanced into the IVC. The filter sheath was advanced over the wire into the IVC. An IVC venogram was performed. Fluoroscopic images were obtained for documentation. A Bard Denali filter was deployed below the lowest renal vein. A follow-up venogram was performed and the vascular sheath was removed with manual compression. FINDINGS: IVC was patent. Bilateral renal veins were identified. The filter was deployed below the lowest renal vein. Follow-up venogram confirmed placement within the IVC and below the renal veins. IMPRESSION: Successful placement of a retrievable IVC filter. PLAN: This IVC filter is potentially retrievable. The patient will be assessed for filter retrieval by Interventional Radiology in approximately 8-12 weeks. Further recommendations regarding filter retrieval, continued surveillance or  declaration of device permanence, will be made at that time. Electronically Signed   By: Richarda Overlie M.D.   On: 02/25/2019 13:04   Vas [place] Transcranial Doppler W Bubbles  Result Date: 02/25/2019  Transcranial Doppler with Bubble Indications: Stroke. Performing Technologist: Jeb Levering RDMS, RVT  Examination Guidelines: A complete evaluation includes B-mode imaging, spectral Doppler, color Doppler, and power Doppler as needed of all accessible portions of each vessel. Bilateral testing is considered an integral part of a complete examination. Limited examinations for reoccurring indications may be performed as noted.  Summary:  A vascular evaluation was performed. The right middle cerebral artery was studied. An IV was inserted into the patient's left forearm. Verbal informed consent was obtained.  No HITS at rest or during Valsalva. No apparent PFO. Negative TCD Bubble study *See table(s) above for measurements and observations.  Diagnosing physician: Delia Heady MD Electronically signed by Delia Heady MD on 02/25/2019 at 5:07:19 PM.    Final     Labs:  Basic Metabolic Panel: Recent Labs  Lab 02/28/19 0846 03/01/19 0440 03/03/19 0725  NA 136 136 138  K 3.8 3.8 3.5  CL 101 100 98  CO2 21* 24 28  GLUCOSE 111* 103* 130*  BUN CREATININE 0.96 0.96 1.13  CALCIUM 9.3 9.5 9.6    CBC: Recent Labs  Lab 02/28/19 0846 03/01/19 0440 03/03/19 0725  WBC 6.5 7.5 6.6  NEUTROABS  --   --  3.9  HGB 16.3 15.8 16.8  HCT 50.0 46.7 51.6  MCV 95.4 95.3 95.2  PLT 297 294 334    CBG: No results for input(s): GLUCAP in the last 168 hours.   Family history. Mother with hypertension. Denies any diabetes or cancer  Brief HPI:    Cody Porter is a 58 year old right-handed male with history of pulmonary emboli diagnosed March started on eliquis as well as history of PAF, alcohol and tobacco use. Lives with his elderly mother. 2 level home. Reportedly independent prior to admission. Presented  02/23/2019 with dizziness and headache nausea vomiting systolic blood pressure 180s. Placed on Cleviprex for blood pressure control. Cranial CT scan showed 2 foci of acute hemorrhage within the cerebellum largest measuring 3 cm greatest dimension. Associated edema local mass  effect no midline shift. Eliquis was reversed with Andexxa . Urine drug screen negative. Hyperosmolar therapy 3% saline initiated. Most recent echocardiogram with ejection fraction of 65%. In regards to recent pulmonary emboli and Eliquis being discontinued due to hemorrhage interventional radiology consulted underwent IVC placement 02/25/2019. Therapy evaluations completed and patient was admitted for a comprehensive rehabilitation program.  Hospital Course: Rudolfo Brandow Irizarry was admitted to rehab 03/01/2019 for inpatient therapies to consist of PT, ST and OT at least three hours five days a week. Past admission physiatrist, therapy team and rehab RN have worked together to provide customized collaborative inpatient rehab. In regards to patient's bilateral cerebellar and subacute left external capsule hemorrhage.Eliquis had been discontinued and reversed. Patient would follow-up with neurology services. IVC filter placed for history of pulmonary emboli 02/25/2019 for interventional radiology. No chest pain or shortness of breath. Low dose aspirin had been initiated. Blood pressure control with Norvasc as well as Lopressor. Patient would follow-up with primary M.D. Tolerating a regular diet. PAF cardiac rate controlled continued low-dose beta blocker. History of alcohol tobacco use counseling provided to regard to cessation of these products.  Physical exam. Blood pressure 150/91 pulse 69 temp 97 6 respirations 19 oxygen saturations 98% room air Constitutional. No distress well developed HEENT Head. Normocephalic and atraumatic Eyes. Pupils round and reactive to light no nystagmus EOMs normal Neck. Supple nontender no tracheal deviation no  thyromegaly Cardiovascular normal rate without murmur or gallop Respiratory. Effort normal no respiratory distress without wheeze GI. Soft nondistended nontender positive bowel sounds Musculoskeletal. Normal range of motion no edema Neurological. Mood is flat but appropriate followed simple commands provides name and age some limited insight and awareness. Normal language speech was clear strength retrograde 4 out of 5 to all fours  Rehab course: During patient's stay in rehab weekly team conferences were held to monitor patient's progress, set goals and discuss barriers to discharge. At admission, patient required min mod assist to ambulate 30 feet ataxic gait. Moderate assist sit to stand, supervision supine to sit.max assist upper body bathing max assist lower body bathing total assist upper body dressing moderate assist lower body dressing moderate assist clothing manipulation and hygiene  He  has had improvement in activity tolerance, balance, postural control as well as ability to compensate for deficits. He/She has had improvement in functional use RUE/LUE  and RLE/LLE as well as improvement in awareness. Patient performed gait throughout unit with close supervision. Minimal assist for a direction changes. Working with standing balance. Performs step ups all directions minimal assistance. Supine to sit with supervision ambulating without use of assistive device to the laundry room to check his clothing. Ambulates to to bathroom and demonstrating ability to step over to H with use of handrail. He was able to communicate his needs however it was advised the need for supervision for safety as well as discussions held no driving or returning to work at this time.Family teaching completed discharged to home       Disposition: discharge to home   Diet:regular  Special Instructions: no driving smoking or alcohol  Medications at discharge. 1. Tylenol as needed 2. Norvasc 10 mg by mouth  daily 3. Aspirin 81 mg by mouth daily 4. Lopressor 25 mg by mouth twice a day  Discharge Instructions    Ambulatory referral to Physical Medicine Rehab   Complete by:  As directed    Moderate complexity follow-up 1-2 weeks ICH        Signed: Mcarthur Rossetti Nichol Ator 03/06/2019,  11:22 AM

## 2019-03-06 NOTE — Discharge Instructions (Signed)
Inpatient Rehab Discharge Instructions  JAYCION SIPOS Discharge date and time: No discharge date for patient encounter.   Activities/Precautions/ Functional Status: Activity: activity as tolerated Diet: regular diet Wound Care: none needed Functional status:  ___ No restrictions     ___ Walk up steps independently ___ 24/7 supervision/assistance   ___ Walk up steps with assistance ___ Intermittent supervision/assistance  ___ Bathe/dress independently ___ Walk with walker     _x__ Bathe/dress with assistance ___ Walk Independently    ___ Shower independently ___ Walk with assistance    ___ Shower with assistance ___ No alcohol     ___ Return to work/school ________  Special Instructions: NoSmoking driving or alcohol   No ELIQUIS until further notice   COMMUNITY REFERRALS UPON DISCHARGE:    Home Health:   PT & SPT    Agency:[facility]   [phone]   Date of last service:03/08/2019  Medical Equipment/Items Ordered:NO NEEDS   Other:MEDICAID AND DISABILITY PENDING  GENERAL COMMUNITY RESOURCES FOR PATIENT/FAMILY: Support Groups:[facility] SUPPORT GROUP THE SECOND Thursday ( SEPT-MAY ) ON THE REHAB UNIT @ 6:00-7:00 PM QUESTIONS CALL AMY [phone]  My questions have been answered and I understand these instructions. I will adhere to these goals and the provided educational materials after my discharge from the hospital.  Patient/Caregiver Signature _______________________________ Date __________  Clinician Signature _______________________________________ Date __________  Please bring this form and your medication list with you to all your follow-up doctor's appointments.

## 2019-03-06 NOTE — Progress Notes (Signed)
[facility] PHYSICAL MEDICINE & REHABILITATION PROGRESS NOTE   Subjective/Complaints: Patient has some nausea no vomiting no diarrhea no abdominal pain.  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  Did not feel like having breakfast today.  Discussed with nursing he does not have any antiemetics.  ROS: Patient denies CP, SOB, N/V/D   Objective:   No results found. No results for input(s): WBC, HGB, HCT, PLT in the last 72 hours. No results for input(s): NA, K, CL, CO2, GLUCOSE, BUN, CREATININE, CALCIUM in the last 72 hours.Bloodwork reviewed   Intake/Output Summary (Last 24 hours) at 03/06/2019 0853 Last data filed at 03/06/2019 0715 Gross per 24 hour  Intake 210 ml  Output 1275 ml  Net -1065 ml     Physical Exam: Vital Signs Blood pressure (!) 158/88, pulse 88, temperature 98 F (36.7 C), temperature source Oral, resp. rate 16, height 5\' 8"  (1.727 m), weight 78.2 kg, SpO2 100 %. Constitutional: No distress . Vital signs reviewed. HEENT: EOMI, oral membranes moist Neck: supple Cardiovascular: RRR without murmur. No JVD    Respiratory: CTA Bilaterally without wheezes or rales. Normal effort    GI: BS +, non-tender, non-distended  Musculoskeletal:Normal range of motion.  General: No edema.  Neurological: Alert and oriented x 3. Normal language. Fair insight and awareness. Decreased fine motor coordination bilaterally with finger to nosemild Strength grossly 4/5 all 4's. Normal LT Skin: Skin iswarmand dry. He isnot diaphoretic.  Psychiatric: cooperative.    Assessment/Plan: 1. Functional deficits secondary to bilateral cerebellar hemorrhages which require 3+ hours per day of interdisciplinary therapy in a comprehensive inpatient rehab setting.  Physiatrist is providing close team supervision and 24 hour management of active medical problems listed below.  Physiatrist and rehab team continue to assess barriers to discharge/monitor  patient progress toward functional and medical goals  Care Tool:  Bathing  Bathing activity did not occur: Refused Body parts bathed by patient: Right arm, Left upper leg, Left arm, Right lower leg, Chest, Left lower leg, Abdomen, Front perineal area, Right upper leg, Buttocks   Body parts bathed by helper: Buttocks     Bathing assist Assist Level: Minimal Assistance - Patient > 75%     Upper Body Dressing/Undressing Upper body dressing   What is the patient wearing?: Pull over shirt    Upper body assist Assist Level: Set up assist    Lower Body Dressing/Undressing Lower body dressing      What is the patient wearing?: Underwear/pull up, Pants     Lower body assist Assist for lower body dressing: Minimal Assistance - Patient > 75%     Toileting Toileting    Toileting assist Assist for toileting: Minimal Assistance - Patient > 75%     Transfers Chair/bed transfer  Transfers assist     Chair/bed transfer assist level: Contact Guard/Touching assist     Locomotion Ambulation   Ambulation assist      Assist level: Contact Guard/Touching assist Assistive device: No Device Max distance: 200'   Walk 10 feet activity   Assist     Assist level: Contact Guard/Touching assist Assistive device: No Device   Walk 50 feet activity   Assist    Assist level: Contact Guard/Touching assist Assistive device: No Device    Walk 150 feet activity   Assist    Assist level: Contact Guard/Touching assist Assistive device: No Device    Walk 10 feet on uneven surface  activity   Assist  Assist level: Moderate Assistance - Patient - 50 - 74%     Wheelchair     Assist Will patient use wheelchair at discharge?: No             Wheelchair 50 feet with 2 turns activity    Assist            Wheelchair 150 feet activity     Assist          Medical Problem List and Plan: 1.Decreased functional mobilitysecondary to  bilateral cerebellar and subacute left external capsule hemorrhage with surrounding edema most likely secondary to Eliquis which has been reversed and elevated blood pressure  Cont CIR PT OT SLP Tentative discharge date 4/27 2. Antithrombotics: -DVT/anticoagulation/history of pulmonary emboli:Status Post IVC filter 02/25/2019 -antiplatelet therapy: N/A 3. Pain Management:Tylenol as needed 4. Mood:provide emotional support -antipsychotic agents: N/A 5. Neuropsych: This patientiscapable of making decisions on hisown behalf. 6. Skin/Wound Care:Routine skin checks 7. Fluids/Electrolytes/Nutrition:Routine ins and outs , normal BMET 8. Hypertension. Cleviprex discontinued. Norvasc 5 mg daily, Lopressor 25 mg twice a day.   - Vitals:   03/06/19 0423 03/06/19 0819  BP: (!) 164/97 (!) 158/88  Pulse: 86 88  Resp: 16   Temp: 98 F (36.7 C)   SpO2: 100%   elevated systolic will increase norvasc 10mg  starting 4/22, monitor effect 9. WUJ:[id number]:Cardiac rate controlled 4/23  - Continue Lopressor 10. History of alcohol tobacco abuse. Provide counseling- may be responsible for cognitive deficits 11.  Nausea secondary to cerebellar hemorrhage, will add as needed Zofran  LOS: 5 days A FACE TO FACE EVALUATION WAS PERFORMED  Erick Colacendrew E  03/06/2019, 8:53 AM

## 2019-03-06 NOTE — Progress Notes (Signed)
Physical Therapy Session Note  Patient Details  Name: Cody Porter MRN: [id number] Date of Birth: 10/30/1961  Today's Date: 03/06/2019 PT Individual Time: 0830-0858 PT Individual Time Calculation (min): 28 min   Short Term Goals: Week 1:  PT Short Term Goal 1 (Week 1): STG=LTG due to ELOS  Skilled Therapeutic Interventions/Progress Updates:    pt performs gait throughout unit with close supervision, wide BOS, min A for any direction chages and stop/start.  Standing balance with cone taps in different patterns with pt able to perform with min A, able to recall patterns of 3.  Pt performs step ups all directions with min A for stepping backward, CGA for lateral and forwards.  nustep x 6 minutes level 5 for LE strength and endurance. Pt left in bed with all needs at hand, alarm set.  Therapy Documentation Precautions:  Precautions Precautions: Fall Restrictions Weight Bearing Restrictions: No Pain: No c/o pain   Therapy/Group: Individual Therapy  Cody Porter 03/06/2019, 8:59 AM

## 2019-03-06 NOTE — Progress Notes (Signed)
Physical Therapy Session Note  Patient Details  Name: DEERIC CRUISE MRN: [id number] Date of Birth: [birth year]  Today's Date: 03/06/2019 PT Individual Time: 1700-1725 PT Individual Time Calculation (min): 25 min   Short Term Goals: Week 1:  PT Short Term Goal 1 (Week 1): STG=LTG due to ELOS  Skilled Therapeutic Interventions/Progress Updates:   Pt received supine in bed and agreeable to PT. Supine>sit transfer with supevrision assist and cues to reduce use of bed features. PT instructed pt in modified [place] level B dynamic balance training program with hand out provided. supervision assist from PT for safety throughout and min cues for posture, step length, and decreased compensations throughout balance program. Pt  performed ambulatory transfer to bed with supervision A. Sit>supine completed without assist from PT, and left supine in bed with call bell in reach and all needs met.        Therapy Documentation Precautions:  Precautions Precautions: Fall Restrictions Weight Bearing Restrictions: No    Vital Signs: Therapy Vitals Temp: 98 F (36.7 C) Temp Source: Oral Pulse Rate: 75 Resp: (!) 24 BP: 135/82 Patient Position (if appropriate): Lying Oxygen Therapy SpO2: 95 % O2 Device: Room Air Pain: Pain Assessment Pain Scale: 0-10 Pain Score: 6  Pain Type: Acute pain Pain Location: Head Pain Orientation: Posterior Pain Descriptors / Indicators: Aching;Discomfort;Headache Pain Onset: Gradual Patients Stated Pain Goal: 2 Pain Intervention(s): RN made aware;Repositioned    Therapy/Group: Individual Therapy  Lorie Phenix 03/06/2019, 6:11 PM

## 2019-03-06 NOTE — Progress Notes (Signed)
Physical Therapy Session Note  Patient Details  Name: [name] MRN: [id number] Date of Birth: 05/14/1961  Today's Date: 03/06/2019 PT Individual Time: 1300-1330 PT Individual Time Calculation (min): 30 min   Short Term Goals: Week 1:  PT Short Term Goal 1 (Week 1): STG=LTG due to ELOS     Skilled Therapeutic Interventions/Progress Updates:  Pt resting in bed.  [place] habituation activity, rolling in bed.  Rolling to L = no vertigo; rolling R = mild vertigo no vertigo with supine> sit to R without use of rails.  Sit> stand with supervision.  Pt stood with bil UE support for 8 x 1 each: heel/toe raises with 5 second hold, 10 x 1 R/L hip abduction, mini squats.  Gait training in hallway with close supervision, including turns, x 100'.   Advanced gait x 15' with head turns up/down = no vertigo, head turns L><R = mild vertigo; CG> min assist. Bed mobility with supervision.  After sitting for a couple of minutes, vertigo resolved.  Pt resting in bed with alarm set and needs at hand, at end of session.     Therapy Documentation Precautions:  Precautions Precautions: Fall Restrictions Weight Bearing Restrictions: No Pain: Pain Assessment Pain Scale: 0-10 Pain Score: 6  Pain Type: Acute pain Pain Location: Head Pain Orientation: Posterior Pain Descriptors / Indicators: Aching;Discomfort;Headache Pain Onset: Gradual Patients Stated Pain Goal: 2 Pain Intervention(s): RN made aware;Repositioned       Therapy/Group: Individual Therapy  Jakiah Bienaime 03/06/2019, 4:50 PM

## 2019-03-07 ENCOUNTER — Inpatient Hospital Stay (HOSPITAL_COMMUNITY): Payer: Self-pay | Admitting: Speech Pathology

## 2019-03-07 ENCOUNTER — Inpatient Hospital Stay (HOSPITAL_COMMUNITY): Payer: Self-pay | Admitting: Occupational Therapy

## 2019-03-07 ENCOUNTER — Inpatient Hospital Stay (HOSPITAL_COMMUNITY): Payer: Self-pay | Admitting: Physical Therapy

## 2019-03-07 DIAGNOSIS — K219 Gastro-esophageal reflux disease without esophagitis: Secondary | ICD-10-CM

## 2019-03-07 MED ORDER — PANTOPRAZOLE SODIUM 40 MG PO TBEC
40.0000 mg | DELAYED_RELEASE_TABLET | Freq: Every day | ORAL | Status: DC
  Administered 2019-03-07: 40 mg via ORAL
  Filled 2019-03-07: qty 1

## 2019-03-07 MED ORDER — ASPIRIN 81 MG PO TBEC: 81.0000 mg | DELAYED_RELEASE_TABLET | Freq: Every day | ORAL | Status: DC

## 2019-03-07 MED ORDER — ACETAMINOPHEN 325 MG PO TABS: 650.0000 mg | ORAL_TABLET | ORAL | Status: DC | PRN

## 2019-03-07 MED ORDER — AMLODIPINE BESYLATE 10 MG PO TABS: 10.0000 mg | ORAL_TABLET | Freq: Every day | ORAL | 0 refills | Status: DC

## 2019-03-07 MED ORDER — PANTOPRAZOLE SODIUM 40 MG PO TBEC: 40.0000 mg | DELAYED_RELEASE_TABLET | Freq: Every day | ORAL | 0 refills | Status: DC

## 2019-03-07 MED ORDER — METOPROLOL TARTRATE 25 MG PO TABS: 25.0000 mg | ORAL_TABLET | Freq: Two times a day (BID) | ORAL | 1 refills | Status: DC

## 2019-03-07 NOTE — Progress Notes (Signed)
Patient has been discharged from IR unit with all personal belongings. Patient was educated and all questions were answered prior to discharge. Patient was taken down and left the hospital with his mother.Patient states he will attend all follow up appointments. Reuben Likes, LPN

## 2019-03-07 NOTE — Progress Notes (Signed)
Social Work  Discharge Note  The overall goal for the admission was met for:   Discharge location: Yes-HOME WITH MOM WHO CAN PROVIDE 24 HR SUPERVISION  Length of Stay: Yes-7 DAYS  Discharge activity level: Yes-SUPERVISION LEVEL  Home/community participation: Yes  Services provided included: MD, RD, PT, OT, SLP, RN, CM, Pharmacy, Neuropsych and SW  Financial Services: Other: PENDING MEDICAID  Follow-up services arranged: Home Health: [facility]-PT & SPT and Patient/Family has no preference for HH/DME agencies  Comments (or additional information):MOM IS THERE WITH PT AND CAN PRVOVIDE SUPERVISION LEVEL. PT DECLINED RESOURCES FOR SUBSTANCE ABUSE. MEDICAID AND DISABILITY PENDING. RECEIVED MATCH IN 01/2019 NOT ELIGIBLE AGAIN UNTIL MARCH 2021  Patient/Family verbalized understanding of follow-up arrangements: Yes  Individual responsible for coordination of the follow-up plan: [name]-MOM  Confirmed correct DME delivered: Elease Hashimoto 03/07/2019    Elease Hashimoto

## 2019-03-07 NOTE — Progress Notes (Signed)
[facility] PHYSICAL MEDICINE & REHABILITATION PROGRESS NOTE   Subjective/Complaints: Some epigastric pain and nausea, took nexium at home which helped  ROS: Patient denies CP, SOB, N/V/D   Objective:   No results found. No results for input(s): WBC, HGB, HCT, PLT in the last 72 hours. No results for input(s): NA, K, CL, CO2, GLUCOSE, BUN, CREATININE, CALCIUM in the last 72 hours.Bloodwork reviewed   Intake/Output Summary (Last 24 hours) at 03/07/2019 0743 Last data filed at 03/07/2019 16100611 Gross per 24 hour  Intake 1040 ml  Output 500 ml  Net 540 ml     Physical Exam: Vital Signs Blood pressure (!) 145/86, pulse 79, temperature 97.9 F (36.6 C), temperature source Oral, resp. rate 18, height 5\' 8"  (1.727 m), weight 78.2 kg, SpO2 96 %. Constitutional: No distress . Vital signs reviewed. HEENT: EOMI, oral membranes moist Neck: supple Cardiovascular: RRR without murmur. No JVD    Respiratory: CTA Bilaterally without wheezes or rales. Normal effort    GI: BS +, minimal epigastric-tenderness, non-distended no masses Musculoskeletal:Normal range of motion.  General: No edema.  Neurological: Alert and oriented x 3. Normal language. Fair insight and awareness. Decreased fine motor coordination bilaterally with finger to nosemild Strength grossly 4/5 all 4's. Normal LT Skin: Skin iswarmand dry. He isnot diaphoretic.  Psychiatric: cooperative.    Assessment/Plan: 1. Functional deficits secondary to bilateral cerebellar hemorrhages  Stable for D/C today F/u PCP in 3-4 weeks F/u PM&R 2 weeks See D/C summary See D/C instructions Care Tool:  Bathing  Bathing activity did not occur: Refused Body parts bathed by patient: Right arm, Left upper leg, Left arm, Right lower leg, Chest, Left lower leg, Abdomen, Front perineal area, Right upper leg, Buttocks   Body parts bathed by helper: Buttocks     Bathing assist Assist Level: Supervision/Verbal cueing     Upper  Body Dressing/Undressing Upper body dressing   What is the patient wearing?: Pull over shirt    Upper body assist Assist Level: Supervision/Verbal cueing    Lower Body Dressing/Undressing Lower body dressing      What is the patient wearing?: Underwear/pull up, Pants     Lower body assist Assist for lower body dressing: Supervision/Verbal cueing     Toileting Toileting    Toileting assist Assist for toileting: Supervision/Verbal cueing     Transfers Chair/bed transfer  Transfers assist     Chair/bed transfer assist level: Supervision/Verbal cueing     Locomotion Ambulation   Ambulation assist      Assist level: Supervision/Verbal cueing Assistive device: No Device Max distance: 100   Walk 10 feet activity   Assist     Assist level: Supervision/Verbal cueing Assistive device: No Device   Walk 50 feet activity   Assist    Assist level: Supervision/Verbal cueing Assistive device: No Device    Walk 150 feet activity   Assist    Assist level: Supervision/Verbal cueing Assistive device: No Device    Walk 10 feet on uneven surface  activity   Assist     Assist level: Moderate Assistance - Patient - 50 - 74%     Wheelchair     Assist Will patient use wheelchair at discharge?: No             Wheelchair 50 feet with 2 turns activity    Assist            Wheelchair 150 feet activity     Assist  Medical Problem List and Plan: 1.Decreased functional mobilitysecondary to 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 Antithrombotics: -DVT/anticoagulation/history of pulmonary emboli:Status Post IVC filter 02/25/2019 -antiplatelet therapy: N/A 3. Pain Management:Tylenol as needed 4. Mood:provide emotional  support -antipsychotic agents: N/A 5. Neuropsych: This patientiscapable of making decisions on hisown behalf. 6. Skin/Wound Care:Routine skin checks 7. Fluids/Electrolytes/Nutrition:Routine ins and outs , normal BMET 8. Hypertension. Cleviprex discontinued. Norvasc 5 mg daily, Lopressor 25 mg twice a day.   - Vitals:   03/06/19 2034 03/07/19 0604  BP: 131/85 (!) 145/86  Pulse: 92 79  Resp: 17 18  Temp:  97.9 F (36.6 C)  SpO2: 96% 96%  elevated systolic will increase norvasc 10mg  starting 4/22, monitor effect 9. UYQ:IHKVQQV rate controlled 4/23  - Continue Lopressor 10. History of alcohol tobacco abuse. Provide counseling- may be responsible for cognitive deficits 11.  Nausea secondary to cerebellar hemorrhage, will add as needed Zofran 12.  GERD with hx ETOH, pt vows to quit ETOH, May use his Nexium at home post d/c will give a dose of protonix prior to d/c today LOS: 6 days A FACE TO FACE EVALUATION WAS PERFORMED  Erick Colace 03/07/2019, 7:43 AM

## 2019-03-07 NOTE — Progress Notes (Signed)
Physical Therapy Discharge Summary  Patient Details  Name: Cody Porter MRN: [id number] Date of Birth: 13-Mar-1961  Today's Date: 03/07/2019 PT Individual Time: 1045-1110 PT Individual Time Calculation (min): 25 min    Patient has met 8 of 8 long term goals due to improved activity tolerance, improved balance, improved postural control, ability to compensate for deficits, improved awareness and improved coordination.  Patient to discharge at an ambulatory level Supervision.   Patient's care partner is independent to provide the necessary physical assistance at discharge.  Reasons goals not met: All PT golas met  Recommendation:  Patient will benefit from ongoing skilled PT services in home health setting to continue to advance safe functional mobility, address ongoing impairments in balance, safety, transfers, coordination, attention, awareness, and minimize fall risk.  Equipment: No equipment provided  Reasons for discharge: treatment goals met and discharge from hospital  Patient/family agrees with progress made and goals achieved: Yes   PT treatment.  Pt received supine in bed and agreeable to PT. Supine>sit transfer without assist or cues  PT instructed pt in Grad day assessment to measure progress toward goals. See below for details. Pt requires supervision assist overal without ADl for all functional mobility including gait over level and unlevel surfaces, car transfer, stair management, and basic transfers. Mild dysmetria noted in RLE with functional movement. PT encouraged pt to utilize RW for improved safety as needed at home.  Pt returned to room and performed ambulatory transfer to bed with supervision assist. Sit>supine completed without assist, and left supine in bed with call bell in reach and all needs met.    PT Discharge Precautions/Restrictions Restrictions Weight Bearing Restrictions: No Vital Signs Therapy Vitals Pulse Rate: 84 BP: (!) 142/88 Pain Pain  Assessment Pain Scale: 0-10 Pain Score: 0-No pain Vision/Perception  Vision - Assessment Additional Comments: WFL with testing. no dizziness noted on this day  Perception Perception: Within Functional Limits Praxis Praxis: Intact  Cognition Orientation Level: Oriented X4 Sensation Sensation Light Touch: Appears Intact Proprioception: Appears Intact Coordination Gross Motor Movements are Fluid and Coordinated: No Fine Motor Movements are Fluid and Coordinated: Yes Coordination and Movement Description: mild dysmetria with functional movement in the RLE Heel Shin Test: decreased speed on the R.  Motor  Motor Motor: Other (comment) Motor - Discharge Observations: generalized weakness  Mobility Bed Mobility Rolling Right: Independent Rolling Left: Independent Supine to Sit: Independent Sit to Supine: Independent Transfers Sit to Stand: Supervision/Verbal cueing Stand to Sit: Supervision/Verbal cueing Stand Pivot Transfers: Supervision/Verbal cueing Locomotion  Gait Ambulation: Yes Gait Assistance: Supervision/Verbal cueing Gait Distance (Feet): 200 Feet Assistive device: None Gait Assistance Details: Verbal cues for gait pattern;Verbal cues for precautions/safety Gait Gait: Yes Gait Pattern: Ataxic Stairs / Additional Locomotion Stairs: Yes Stairs Assistance: Supervision/Verbal cueing Stair Management Technique: One rail Left Number of Stairs: 12 Height of Stairs: 6 Wheelchair Mobility Wheelchair Mobility: No  Trunk/Postural Assessment  Cervical Assessment Cervical Assessment: Within Functional Limits Thoracic Assessment Thoracic Assessment: Exceptions to WFL(rounded shoulders) Lumbar Assessment Lumbar Assessment: Exceptions to WFL(posterior pelvic tilt)  Balance Balance Balance Assessed: Yes Static Sitting Balance Static Sitting - Balance Support: Feet supported;No upper extremity supported Static Sitting - Level of Assistance: 6: Modified independent  (Device/Increase time) Dynamic Sitting Balance Dynamic Sitting - Balance Support: During functional activity;Feet supported;No upper extremity supported Dynamic Sitting - Level of Assistance: 6: Modified independent (Device/Increase time) Static Standing Balance Static Standing - Balance Support: During functional activity;No upper extremity supported Static Standing - Level of Assistance: 5:  Stand by assistance Dynamic Standing Balance Dynamic Standing - Balance Support: During functional activity;No upper extremity supported Dynamic Standing - Level of Assistance: 5: Stand by assistance Extremity Assessment      RLE Assessment RLE Assessment: Within Functional Limits LLE Assessment LLE Assessment: Within Functional Limits    Lorie Phenix 03/07/2019, 11:14 AM

## 2019-03-10 ENCOUNTER — Telehealth: Payer: Self-pay | Admitting: Registered Nurse

## 2019-03-10 NOTE — Telephone Encounter (Signed)
Placed a call to Ms. Matherly regarding Mr. Holik. No answer, left message to return the call.

## 2019-03-10 NOTE — Telephone Encounter (Signed)
Patients mother has returned call to you

## 2019-03-11 ENCOUNTER — Encounter: Payer: Self-pay | Attending: Registered Nurse | Admitting: Registered Nurse

## 2019-03-11 ENCOUNTER — Encounter: Payer: Self-pay | Admitting: Registered Nurse

## 2019-03-11 ENCOUNTER — Other Ambulatory Visit: Payer: Self-pay

## 2019-03-11 ENCOUNTER — Telehealth: Payer: Self-pay

## 2019-03-11 DIAGNOSIS — I2699 Other pulmonary embolism without acute cor pulmonale: Secondary | ICD-10-CM

## 2019-03-11 DIAGNOSIS — I10 Essential (primary) hypertension: Secondary | ICD-10-CM

## 2019-03-11 DIAGNOSIS — I82403 Acute embolism and thrombosis of unspecified deep veins of lower extremity, bilateral: Secondary | ICD-10-CM

## 2019-03-11 DIAGNOSIS — I614 Nontraumatic intracerebral hemorrhage in cerebellum: Secondary | ICD-10-CM

## 2019-03-11 DIAGNOSIS — I619 Nontraumatic intracerebral hemorrhage, unspecified: Secondary | ICD-10-CM

## 2019-03-11 DIAGNOSIS — F17210 Nicotine dependence, cigarettes, uncomplicated: Secondary | ICD-10-CM

## 2019-03-11 DIAGNOSIS — I480 Paroxysmal atrial fibrillation: Secondary | ICD-10-CM

## 2019-03-11 NOTE — Telephone Encounter (Signed)
Please send order for OP PT, OT, SLP

## 2019-03-11 NOTE — Telephone Encounter (Signed)
Maria PT from [facility] called stating that the mother of the patient refused HHPT stating the house is not accessible for [facility] and rather have Outpt therapy. Order will need sent to Outpt therapy

## 2019-03-11 NOTE — Progress Notes (Signed)
Transitional Care call Transitional Questions answered by [name]  Patient name: Cody Porter  DOB: 1961/06/25 1. Are you/is patient experiencing any problems since coming home? No a. Are there any questions regarding any aspect of care? No 2. Are there any questions regarding medications administration/dosing? No a. Are meds being taken as prescribed? Yes b. "Patient should review meds with caller to confirm" Medication List Reviewed 3. Have there been any falls? No 4. Has Home Health been to the house and/or have they contacted you? [name] states they refused [facility], she states she has boxes in her apartment, she is scheduled to move. She states at this time there isn'y any room in her apartment for the  Therapist to work.  a. If not, have you tried to contact them? See above b. Can we help you contact them? See Above 5. Are bowels and bladder emptying properly? Yes a. Are there any unexpected incontinence issues? No b. If applicable, is patient following bowel/bladder programs? NA 6. Any fevers, problems with breathing, unexpected pain? No 7. Are there any skin problems or new areas of breakdown? No 8. Has the patient/family member arranged specialty MD follow up (ie cardiology/neurology/renal/surgical/etc.)?  [name] was given [facility] number to schedule for HFU appointment, she verbalizes understanding.  a. Can we help arrange? No 9. Does the patient need any other services or support that we can help arrange? No 10. Are caregivers following through as expected in assisting the patient? Yes 11. Has the patient quit smoking, drinking alcohol, or using drugs as recommended? [name] reports [name] does smoke cigarretts, no drinking and no illicit drugs.   Total Time Spent on Call 10 minutes   Appointment date/time 03/21/2019  arrival time 1:45 pm for 2:00 appointment with Dr. Wynn Banker. At [address] [employer] [address]

## 2019-03-21 ENCOUNTER — Other Ambulatory Visit: Payer: Self-pay

## 2019-03-21 ENCOUNTER — Encounter: Payer: Self-pay | Admitting: Physical Medicine & Rehabilitation

## 2019-03-21 ENCOUNTER — Encounter: Payer: Self-pay | Attending: Physical Medicine & Rehabilitation | Admitting: Physical Medicine & Rehabilitation

## 2019-03-21 VITALS — BP 136/90 | HR 70 | Temp 97.4°F | Ht 68.0 in | Wt 172.0 lb

## 2019-03-21 DIAGNOSIS — I619 Nontraumatic intracerebral hemorrhage, unspecified: Secondary | ICD-10-CM

## 2019-03-21 DIAGNOSIS — R4189 Other symptoms and signs involving cognitive functions and awareness: Secondary | ICD-10-CM | POA: Insufficient documentation

## 2019-03-21 DIAGNOSIS — I614 Nontraumatic intracerebral hemorrhage in cerebellum: Secondary | ICD-10-CM | POA: Insufficient documentation

## 2019-03-21 DIAGNOSIS — Z8673 Personal history of transient ischemic attack (TIA), and cerebral infarction without residual deficits: Secondary | ICD-10-CM | POA: Insufficient documentation

## 2019-03-21 DIAGNOSIS — I480 Paroxysmal atrial fibrillation: Secondary | ICD-10-CM | POA: Insufficient documentation

## 2019-03-21 DIAGNOSIS — Z7901 Long term (current) use of anticoagulants: Secondary | ICD-10-CM | POA: Insufficient documentation

## 2019-03-21 DIAGNOSIS — R269 Unspecified abnormalities of gait and mobility: Secondary | ICD-10-CM

## 2019-03-21 DIAGNOSIS — Z86718 Personal history of other venous thrombosis and embolism: Secondary | ICD-10-CM | POA: Insufficient documentation

## 2019-03-21 DIAGNOSIS — Z86711 Personal history of pulmonary embolism: Secondary | ICD-10-CM | POA: Insufficient documentation

## 2019-03-21 DIAGNOSIS — F1010 Alcohol abuse, uncomplicated: Secondary | ICD-10-CM | POA: Insufficient documentation

## 2019-03-21 DIAGNOSIS — I69398 Other sequelae of cerebral infarction: Secondary | ICD-10-CM

## 2019-03-21 DIAGNOSIS — F17210 Nicotine dependence, cigarettes, uncomplicated: Secondary | ICD-10-CM | POA: Insufficient documentation

## 2019-03-21 DIAGNOSIS — R270 Ataxia, unspecified: Secondary | ICD-10-CM | POA: Insufficient documentation

## 2019-03-21 DIAGNOSIS — Z8249 Family history of ischemic heart disease and other diseases of the circulatory system: Secondary | ICD-10-CM | POA: Insufficient documentation

## 2019-03-21 MED ORDER — METOPROLOL TARTRATE 25 MG PO TABS: 25.0000 mg | ORAL_TABLET | Freq: Two times a day (BID) | ORAL | 1 refills | Status: DC

## 2019-03-21 MED ORDER — AMLODIPINE BESYLATE 10 MG PO TABS: 10.0000 mg | ORAL_TABLET | Freq: Every day | ORAL | 0 refills | Status: DC

## 2019-03-21 NOTE — Patient Instructions (Addendum)
Congrulations on not drinking  Try to stop smoking next  Make appt with Dr Everlene Other for primary MD    See Neurologist Jun 8 , cont Aspirin one tablet per day  take with food  No driving until cleared by Neurologist next month

## 2019-03-21 NOTE — Progress Notes (Signed)
Subjective:    Patient ID: Cody Porter, male    DOB: Jun 10, 1961, 58 y.o.   MRN: [id number] 58 year old right-handed male with history of pulmonary emboli diagnosed March started on eliquis as well as history of PAF, alcohol and tobacco use. Lives with his elderly mother. 2 level home. Reportedly independent prior to admission. Presented 02/23/2019 with dizziness and headache nausea vomiting systolic blood pressure 180s. Placed on Cleviprex for blood pressure control. Cranial CT scan showed 2 foci of acute hemorrhage within the cerebellum largest measuring 3 cm greatest dimension. Associated edema local mass effect no midline shift. Eliquis was reversed with Andexxa . Urine drug screen negative. Hyperosmolar therapy 3% saline initiated. Most recent echocardiogram with ejection fraction of 65%. In regards to recent pulmonary emboli and Eliquis being discontinued due to hemorrhage interventional radiology consulted underwent IVC placement 02/25/2019 HPI 58 year old male with history of alcohol abuse, pulmonary embolism in March 2020 started on Eliquis who presented with mental status changes in April 2020, diagnostic imaging revealing cerebellar hemorrhage.  Patient had decreased mobility self-care skills and therefore was brought to inpatient rehabilitation for comprehensive intensive inpatient rehabilitation with PT OT and speech.  Worsening mild to moderate cognitive deficits.  Some question whether the cognitive deficits may have been related to history of alcohol abuse. The patient was discharged to home where he is staying with his mother.  Mod I dressing and bathing  Stood in shower first time  No home health due to moving.  Trying to slowly pack up and move to 1 level home His mother is doing all the driving.  The patient drove prior to his stroke  DC Meds 1. Tylenol as needed 2. Norvasc 10 mg by mouth daily 3. Aspirin 81 mg by mouth daily- stopped due to nausea 4. Lopressor 25 mg by  mouth twice a day  Pt states father had brain aneurysm in 1970s and had brain sugery  Balance and fatigue are improving   Patient has difficulty recalling the events leading up to his hospitalization. Pain Inventory Average Pain 0 Pain Right Now 0 My pain is na  In the last 24 hours, has pain interfered with the following? General activity 0 Relation with others 0 Enjoyment of life 0 What TIME of day is your pain at its worst? na Sleep (in general) na  Pain is worse with: na Pain improves with: na Relief from Meds: na  Mobility walk without assistance use a cane  Function not employed: date last employed 2016  Neuro/Psych weakness dizziness confusion  Prior Studies Any changes since last visit?  no  Physicians involved in your care Any changes since last visit?  no   Family History  Family history unknown: Yes   Social History   Socioeconomic History  . Marital status: Single    Spouse name: Not on file  . Number of children: Not on file  . Years of education: Not on file  . Highest education level: Not on file  Occupational History  . Not on file  Social Needs  . Financial resource strain: Not on file  . Food insecurity:    Worry: Not on file    Inability: Not on file  . Transportation needs:    Medical: Not on file    Non-medical: Not on file  Tobacco Use  . Smoking status: Current Every Day Smoker    Packs/day: 0.75    Types: Cigarettes  . Smokeless tobacco: Never Used  Substance and Sexual Activity  .  Alcohol use: Not Currently    Alcohol/week: 14.0 standard drinks    Types: 14 Cans of beer per week    Comment: Patient states no alcohol x 1 month  . Drug use: Not Currently    Types: Marijuana  . Sexual activity: Not on file  Lifestyle  . Physical activity:    Days per week: Not on file    Minutes per session: Not on file  . Stress: Not on file  Relationships  . Social connections:    Talks on phone: Not on file    Gets together:  Not on file    Attends religious service: Not on file    Active member of club or organization: Not on file    Attends meetings of clubs or organizations: Not on file    Relationship status: Not on file  Other Topics Concern  . Not on file  Social History Narrative  . Not on file   Past Surgical History:  Procedure Laterality Date  . HERNIA REPAIR    . IR IVC FILTER PLMT / S&I /IMG GUID/MOD SED  02/25/2019  . TONSILLECTOMY     Past Medical History:  Diagnosis Date  . ETOH abuse 01/28/2019  . Leg DVT (deep venous thromboembolism), acute, bilateral (HCC) 01/28/2019  . Pulmonary embolus (HCC) 01/28/2019   BP 136/90   Pulse 70   Temp (!) 97.4 F (36.3 C)   Ht  (1.727 m)   Wt 172 lb (78 kg)   SpO2 98%   BMI 26.15 kg/m   Opioid Risk Score:   Fall Risk Score:  `1  Depression screen PHQ 2/9  No flowsheet data found.   Review of Systems  Constitutional: Positive for fatigue.  HENT: Negative.   Eyes: Negative.   Respiratory: Negative.   Cardiovascular: Negative.   Gastrointestinal: Positive for constipation and nausea.  Endocrine: Negative.   Genitourinary: Negative.   Musculoskeletal: Negative.   Skin: Negative.   Allergic/Immunologic: Negative.   Neurological: Positive for dizziness and weakness.  Hematological: Bruises/bleeds easily.  Psychiatric/Behavioral: Positive for confusion.  All other systems reviewed and are negative.      Objective:   Physical Exam Vitals signs and nursing note reviewed. Exam conducted with a chaperone present.  Constitutional:      Appearance: Normal appearance. He is normal weight.  HENT:     Head: Normocephalic and atraumatic.     Mouth/Throat:     Mouth: Mucous membranes are moist.  Eyes:     Extraocular Movements: Extraocular movements intact.     Conjunctiva/sclera: Conjunctivae normal.     Pupils: Pupils are equal, round, and reactive to light.     Comments: No evidence of nystagmus  Neck:     Musculoskeletal:  Normal range of motion.  Cardiovascular:     Rate and Rhythm: Normal rate and regular rhythm.     Heart sounds: Normal heart sounds. No murmur.  Pulmonary:     Effort: Pulmonary effort is normal. No respiratory distress.     Breath sounds: Normal breath sounds. No stridor. No wheezing.  Abdominal:     General: Abdomen is flat. Bowel sounds are normal. There is no distension.     Palpations: Abdomen is soft.     Tenderness: There is no abdominal tenderness.  Musculoskeletal: Normal range of motion.        General: No swelling.  Skin:    General: Skin is warm and dry.     Coloration: Skin is not jaundiced.  Neurological:     General: No focal deficit present.     Mental Status: He is alert and oriented to person, place, and time.     Cranial Nerves: No dysarthria or facial asymmetry.     Motor: No weakness, tremor or abnormal muscle tone.     Coordination: Romberg sign negative. Finger-Nose-Finger Test and Heel to [facility] Test normal. Impaired rapid alternating movements.     Gait: Gait abnormal.  Psychiatric:        Mood and Affect: Mood normal. Affect is blunt.        Speech: Speech is delayed. Speech is not slurred.        Behavior: Behavior is slowed and withdrawn. Behavior is cooperative.        Thought Content: Thought content normal.        Cognition and Memory: He exhibits impaired recent memory.    Patient has minimal loss of balance with position changes.  Unable to perform tandem gait       Assessment & Plan:  1.  Bilateral cerebellar hemorrhages.  Functionally he has improved to modified independent level.  He still has some problems with balance particularly with change of directions, mainly has truncal ataxia rather than limb ataxia He would still benefit from outpatient PT OT and speech.  His cognitive deficits may be related more to his alcohol abuse history rather than due to his stroke. Have asked patient to resume his aspirin but take it with food.  He will  follow-up with neurology June 8.   Given that the patient has improved in terms of his functional status will not schedule physical medicine rehab follow-up We discussed the need to establish with primary care but I have given him another month supply of both amlodipine and lisinopril to make sure he does not run out  Patient still has IVC filter in and will likely need this until he either regains normal mobility or can resume anticoagulation.  Neurology will address whether he should at any point resume the Eliquis.  We discussed no driving until cleared by neurology or primary care  Discussed smoking cessation Congratulated patient for no alcohol use

## 2019-04-09 ENCOUNTER — Encounter: Payer: Self-pay | Admitting: Occupational Therapy

## 2019-04-09 ENCOUNTER — Ambulatory Visit: Payer: Self-pay | Admitting: Occupational Therapy

## 2019-04-09 ENCOUNTER — Other Ambulatory Visit: Payer: Self-pay

## 2019-04-09 ENCOUNTER — Encounter: Payer: Self-pay | Admitting: Physical Therapy

## 2019-04-09 ENCOUNTER — Ambulatory Visit: Payer: Self-pay

## 2019-04-09 ENCOUNTER — Ambulatory Visit: Payer: Self-pay | Attending: Physical Medicine & Rehabilitation | Admitting: Physical Therapy

## 2019-04-09 VITALS — BP 119/77 | HR 60

## 2019-04-09 DIAGNOSIS — M6281 Muscle weakness (generalized): Secondary | ICD-10-CM | POA: Insufficient documentation

## 2019-04-09 DIAGNOSIS — R41841 Cognitive communication deficit: Secondary | ICD-10-CM | POA: Insufficient documentation

## 2019-04-09 DIAGNOSIS — R2681 Unsteadiness on feet: Secondary | ICD-10-CM | POA: Insufficient documentation

## 2019-04-09 DIAGNOSIS — R4701 Aphasia: Secondary | ICD-10-CM | POA: Insufficient documentation

## 2019-04-09 DIAGNOSIS — R2689 Other abnormalities of gait and mobility: Secondary | ICD-10-CM | POA: Insufficient documentation

## 2019-04-09 DIAGNOSIS — R278 Other lack of coordination: Secondary | ICD-10-CM | POA: Insufficient documentation

## 2019-04-09 DIAGNOSIS — R42 Dizziness and giddiness: Secondary | ICD-10-CM | POA: Insufficient documentation

## 2019-04-09 NOTE — Therapy (Addendum)
[facility] [address] [place], [place], [address] Phone: [phone]   Fax:  [phone]  Physical Therapy Evaluation  Patient Details  Name: Cody Porter MRN: [id number] Date of Birth: Nov 12, 1961 Referring Provider (PT): Erick Colace, MD   Encounter Date: 04/09/2019   CLINIC OPERATION CHANGES: Outpatient Neuro Rehabilitation Clinic is operating at a low capacity due to COVID-19.  The patient was brought into the clinic for evaluation and/or treatment following universal masking by staff, social distancing, and <10 people in the clinic.  The patient's COVID risk of complications score is 2.   PT End of Session - 04/09/19 1251    Visit Number  1    Number of Visits  9    Date for PT Re-Evaluation  06/08/19    Authorization Type  Medicaid Pending    PT Start Time  1148    PT Stop Time  1230    PT Time Calculation (min)  42 min    Activity Tolerance  Patient tolerated treatment well    Behavior During Therapy  Flat affect       Past Medical History:  Diagnosis Date  . ETOH abuse 01/28/2019  . Leg DVT (deep venous thromboembolism), acute, bilateral (HCC) 01/28/2019  . Pulmonary embolus (HCC) 01/28/2019    Past Surgical History:  Procedure Laterality Date  . HERNIA REPAIR    . IR IVC FILTER PLMT / S&I /IMG GUID/MOD SED  02/25/2019  . TONSILLECTOMY      Vitals:   04/09/19 1155  BP: 119/77  Pulse: 60     Subjective Assessment - 04/09/19 1155    Subjective  Pt notes more wobbling and dizziness since hospitalization.  Pt denies falls.  Pt feels the most off balance when he first stands up and starts to walk.  Pt also feels a little dizzy when he first stands up.  Also feels a little dizzy when bending down to the floor.  Denies dizziness with bed mobility.  Reports no dizziness before CVA    Patient is accompained by:  Family member    Pertinent History  ETOH abuse, tobacco use, bilat LE DVT, PE with  IVC filter    Limitations  Standing;Walking    Patient Stated Goals  To get rid of the dizziness.     Currently in Pain?  No/denies         [facility] PT Assessment - 04/09/19 1158      Assessment   Medical Diagnosis  bilat cerebellar infarcts    Referring Provider (PT)  Erick Colace, MD    Onset Date/Surgical Date  02/23/19    Hand Dominance  Right    Next MD Visit  unknown    Prior Therapy  inpt rehab to PT, OT and ST; refused HH therapies      Precautions   Precautions  Fall    Precaution Comments  ETOH abuse, tobacco use, bilat LE DVT, PE with IVC filter      Balance Screen   Has the patient fallen in the past 6 months  No      Home Environment   Living Environment  Private residence    Living Arrangements  Parent    Type of Home  Other(Comment)   town home   Home Access  Stairs to enter    Entrance Stairs-Number of Steps  3    Home Layout  Two level;Bed/bath upstairs    Alternate Level Stairs-Number of Steps  13    Alternate Level Stairs-Rails  Right    Additional Comments  Going into his townhome he has three platform/sidewalk steps to negotiate      Prior Function   Level of Independence  Independent    Vocation  Full time employment    Vocation Requirements  worked at [employer] leaves etc    Leisure  watch football      Observation/Other Assessments   Focus on Therapeutic Outcomes (FOTO)   Not assessed      Sensation   Light Touch  Appears Intact      Coordination   Gross Motor Movements are Fluid and Coordinated  No    Finger Nose Finger Test  WFL, slightly delayed    Heel Shin Test  slightly delayed      Tone   Assessment Location  Right Lower Extremity;Left Lower Extremity      ROM / Strength   AROM / PROM / Strength  Strength      AROM   Overall AROM   Deficits      Strength   Overall Strength  Deficits    Overall Strength Comments  4/5 bilat hip flexion; 5/5 knee flexion and extension bilaterally, 4/5 ankle DF       Ambulation/Gait   Ambulation/Gait  Yes    Ambulation/Gait Assistance  6: Modified independent (Device/Increase time)    Ambulation Distance (Feet)  115 Feet    Assistive device  None    Gait Pattern  Step-through pattern;Decreased step length - right;Decreased step length - left;Decreased stride length    Ambulation Surface  Level;Indoor    Stairs  Yes    Stairs Assistance  6: Modified independent (Device/Increase time)    Stair Management Technique  No rails;Alternating pattern;Forwards    Number of Stairs  4    Height of Stairs  6      Standardized Balance Assessment   Standardized Balance Assessment  Berg Balance Test;Five Times Sit to Stand    Five times sit to stand comments   21.44 seconds, no UE use      Berg Balance Test   Sit to Stand  Able to stand without using hands and stabilize independently    Standing Unsupported  Able to stand safely 2 minutes    Sitting with Back Unsupported but Feet Supported on Floor or Stool  Able to sit safely and securely 2 minutes    Stand to Sit  Sits safely with minimal use of hands    Transfers  Able to transfer safely, minor use of hands    Standing Unsupported with Eyes Closed  Able to stand 10 seconds safely    Standing Unsupported with Feet Together  Able to place feet together independently and stand for 1 minute with supervision    From Standing, Reach Forward with Outstretched Arm  Can reach confidently >25 cm (10")    From Standing Position, Pick up Object from Floor  Able to pick up shoe, needs supervision    From Standing Position, Turn to Look Behind Over each Shoulder  Looks behind from both sides and weight shifts well    Turn 360 Degrees  Able to turn 360 degrees safely but slowly    Standing Unsupported, Alternately Place Feet on Step/Stool  Able to stand independently and safely and complete 8 steps in 20 seconds    Standing Unsupported, One Foot in Front  Able to place foot tandem independently and hold 30 seconds  Standing  on One Leg  Tries to lift leg/unable to hold 3 seconds but remains standing independently    Total Score  49    Berg comment:  49/56      Functional Gait  Assessment   Gait assessed   Yes    Gait Level Surface  Walks 20 ft in less than 7 sec but greater than 5.5 sec, uses assistive device, slower speed, mild gait deviations, or deviates 6-10 in outside of the 12 in walkway width.    Change in Gait Speed  Able to smoothly change walking speed without loss of balance or gait deviation. Deviate no more than 6 in outside of the 12 in walkway width.    Gait with Horizontal Head Turns  Performs head turns with moderate changes in gait velocity, slows down, deviates 10-15 in outside 12 in walkway width but recovers, can continue to walk.    Gait with Vertical Head Turns  Performs task with slight change in gait velocity (eg, minor disruption to smooth gait path), deviates 6 - 10 in outside 12 in walkway width or uses assistive device    Gait and Pivot Turn  Pivot turns safely in greater than 3 sec and stops with no loss of balance, or pivot turns safely within 3 sec and stops with mild imbalance, requires small steps to catch balance.    Step Over Obstacle  Is able to step over one shoe box (4.5 in total height) without changing gait speed. No evidence of imbalance.    Gait with Narrow Base of Support  Ambulates 4-7 steps.    Gait with Eyes Closed  Walks 20 ft, slow speed, abnormal gait pattern, evidence for imbalance, deviates 10-15 in outside 12 in walkway width. Requires more than 9 sec to ambulate 20 ft.    Ambulating Backwards  Walks 20 ft, uses assistive device, slower speed, mild gait deviations, deviates 6-10 in outside 12 in walkway width.    Steps  Alternating feet, no rail.    Total Score  19    FGA comment:  19/30      RLE Tone   RLE Tone  Mild      LLE Tone   LLE Tone  Within Functional Limits                Objective measurements completed on examination: See above  findings.              PT Education - 04/09/19 1251    Education Details  clinical findings, PT POC and goals    Person(s) Educated  Patient;Parent(s)    Methods  Explanation    Comprehension  Verbalized understanding          PT Long Term Goals - 04/09/19 1257      PT LONG TERM GOAL #1   Title  Pt will demonstrate independence with balance and vestibular HEP    Baseline  dependent    Time  8    Period  Weeks    Status  New    Target Date  06/08/19      PT LONG TERM GOAL #2   Title  Pt will demonstrate improved bilat LE strength as indicated by improvement in Five time sit to stand to </= 15 seconds    Baseline  21.44 seconds without use of UE    Time  8    Period  Weeks    Status  New    Target Date  06/08/19      PT LONG TERM GOAL #3   Title  Pt will demonstrate decreased falls risk as indicated by improvement in BERG to >/= 53/56    Baseline  49/56    Time  8    Period  Weeks    Status  New    Target Date  06/08/19      PT LONG TERM GOAL #4   Title  Pt will demonstrate decreased falls risk during ambulation as indicated by improvement in FGA to >/= 23/30    Baseline  19/30    Time  8    Period  Weeks    Status  New    Target Date  06/08/19      PT LONG TERM GOAL #5   Title  Pt will perform ambulation outdoors x 1000' over uneven paved and grassy surfaces, up/down curb and reaching down to ground to pick up items (without dizziness) independently to indicate safe return to work.    Baseline  115' indoors with supervision; dizziness with reaching down to floor    Time  8    Period  Weeks    Status  New    Target Date  06/08/19             Plan - 04/09/19 1252    Clinical Impression Statement  Pt is a 58 year old male referred to Neuro OPPT for evaluation of balance impairments s/p bilateral cerebellar hemorrhages.  Pt's PMH is significant for the following: ETOH abuse, tobacco use, bilat LE DVT, PE with IVC filter. The following deficits  were noted during pt's exam: dizziness/disequilibrium, impaired LE strength, impaired coordination, impaired balance and gait.  Pt's BERG, five time sit to stand and FGA scores indicate pt is at medium risk for falls. Pt would benefit from skilled PT to address these impairments and functional limitations to maximize functional mobility independence and reduce falls risk    Personal Factors and Comorbidities  Comorbidity 3+;Finances;Profession    Comorbidities  ETOH abuse, tobacco use, bilat LE DVT, PE with IVC filter    Examination-Activity Limitations  Bend;Locomotion Level;Stand    Examination-Participation Restrictions  Community Activity;Yard Work    Stability/Clinical Decision Making  Stable/Uncomplicated    OptometristClinical Decision Making  Low    Rehab Potential  Good    PT Frequency  1x / week    PT Duration  8 weeks    PT Treatment/Interventions  ADLs/Self Care Home Management;Gait training;Stair training;Functional mobility training;Therapeutic activities;Therapeutic exercise;Balance training;Neuromuscular re-education;Patient/family education;Vestibular    PT Next Visit Plan  assess vestibular function, initiate HEP    Consulted and Agree with Plan of Care  Patient;Family member/caregiver    Family Member Consulted  mother       Patient will benefit from skilled therapeutic intervention in order to improve the following deficits and impairments:  Abnormal gait, Decreased balance, Decreased coordination, Decreased strength, Difficulty walking, Dizziness  Visit Diagnosis: Dizziness and giddiness  Other abnormalities of gait and mobility  Other lack of coordination  Unsteadiness on feet  Muscle weakness (generalized)     Problem List Patient Active Problem List   Diagnosis Date Noted  . Cerebral edema (HCC) 02/28/2019  . Atrial fibrillation (HCC) 02/28/2019  . Cigarette smoker 02/28/2019  . Family history of stroke 02/28/2019  . ICH (intracerebral hemorrhage) (HCC) B  cerebellar and L external capsule hmg while on Eliquis 02/23/2019  . ETOH abuse 01/28/2019  . Pulmonary embolus (HCC) 01/28/2019  . Leg DVT (  deep venous thromboembolism), acute, bilateral (HCC) 01/28/2019  . Acute pulmonary embolism (HCC) 01/28/2019    Dierdre Highman, PT, DPT 04/09/19    1:04 PM    [facility] [address] [place], [place], [address] Phone: [phone]   Fax:  [phone]  Name: Cody Porter MRN: [id number] Date of Birth: [birth year]

## 2019-04-09 NOTE — Therapy (Signed)
[facility] [address] [place], [place], [address] Phone: [phone]   Fax:  [phone]  Speech Language Pathology Evaluation  Patient Details  Name: Cody Porter MRN: [id number] Date of Birth: [birth year] Referring Provider (SLP): Claudette Laws, MD   Encounter Date: 04/09/2019  End of Session - 04/09/19 1629    Visit Number  1    Number of Visits  17    Date for SLP Re-Evaluation  07/02/19   12 weeks   SLP Start Time  1015    SLP Stop Time   1100    SLP Time Calculation (min)  45 min    Activity Tolerance  Patient tolerated treatment well       Past Medical History:  Diagnosis Date  . ETOH abuse 01/28/2019  . Leg DVT (deep venous thromboembolism), acute, bilateral (HCC) 01/28/2019  . Pulmonary embolus (HCC) 01/28/2019    Past Surgical History:  Procedure Laterality Date  . HERNIA REPAIR    . IR IVC FILTER PLMT / S&I /IMG GUID/MOD SED  02/25/2019  . TONSILLECTOMY      There were no vitals filed for this visit.  Subjective Assessment - 04/09/19 1135    Subjective  "Yah, i did some stuff." "Some stuff." "I did some stuff."    Patient is accompained by:  Family member   mother, Mimi   Currently in Pain?  No/denies         SLP Evaluation [facility] - 04/09/19 1135      SLP Visit Information   SLP Received On  04/09/19    Referring Provider (SLP)  Claudette Laws, MD    Onset Date  April 2020    Medical Diagnosis  CVA      Subjective   Patient/Family Stated Goal  Pt stated he would like to work on his word finding skills.      General Information   HPI  Admitted in March for PE and DVT. Admitted in April for what imaging ID'd as acute bil cerebellar CVAs with edema (rt > lt), and acute/subacute hemmorhage at juncture of lt external capsule and lt basal ganglia.      Prior Functional Status   Cognitive/Linguistic Baseline  Baseline deficits   supervision was likely, via pt history intake   Baseline deficit details  pt was dependent upon mother for medication management; pt unemployed and walked on [employer] most days (per mother and pt) and pt "drank (ETOH) every day"    Type of Home  Apartment     Lives With  Family    Available Support  --   mother   Education  12th grade    Vocation  Unemployed      Cognition   Overall Cognitive Status  History of cognitive impairments - at baseline      Auditory Comprehension   Overall Auditory Comprehension  Appears within functional limits for tasks assessed      Verbal Expression   Overall Verbal Expression  Impaired    Naming  Impairment    Confrontation  --   46/60 on [employer] (WNL is 51/60 or above)   Other Naming Comments  Anomia evident in conversation - pt using vague speech. Mother states pt "memory is bad" (described as pt cannot think of words he would have known prior to hospitalization).    Interfering Components  Premorbid deficit    Effective Techniques  --   SLP cues  to describe object/idea, use synonyms or rephrase     Oral Motor/Sensory Function   Overall Oral Motor/Sensory Function  Appears within functional limits for tasks assessed      Motor Speech   Overall Motor Speech  Appears within functional limits for tasks assessed                      SLP Education - 04/09/19 1629    Education Details  anomia compensations    Person(s) Educated  Patient;Parent(s)    Methods  Explanation;Demonstration    Comprehension  Verbalized understanding;Need further instruction       SLP Short Term Goals - 04/09/19 1650      SLP SHORT TERM GOAL #1   Title  pt will provide functional verbal desription of two compensations for anomia over two sessions    Time  4    Period  Weeks    Status  New      SLP SHORT TERM GOAL #2   Title  pt will complete simple to mod complex naming tasks with 90% success and modified independence    Time  4    Period  Weeks     Status  New       SLP Long Term Goals - 04/09/19 1652      SLP LONG TERM GOAL #1   Title  pt score on [employer] will improve to >46/60    Time  8    Period  Weeks    Status  New      SLP LONG TERM GOAL #2   Title  pt will engage functionally in mod complex conversation with modified independence (compensation use) x 3 sessions    Time  8    Period  Weeks    Status  New       Plan - 04/09/19 1631    Clinical Impression Statement  Pt presented today with anomia, and strongly suspected baseline cognitive communication deficits. Pt occasionally used semantic compensations (e.g., "the think you count with") during testing but rarely during conversation. Instead, pt chose to use vague speech or "you know". Pt would benefit from skilled ST addressing expressive verbal language skills as well as compensations in conversation. During the day, pt enjoys watching sports and walking to business establishments near to home.    Speech Therapy Frequency  2x / week    Duration  --   8 weeks (or 17 sessions), however frequenc/duration to be at x1/week for 8 weeks due to pt preference. After Medicaid approved pt stated he may desire incr to x2/week.   Treatment/Interventions  Patient/family education;SLP instruction and feedback;Internal/external aids;Compensatory techniques;Language facilitation;Cueing hierarchy;Functional tasks    Potential to Achieve Goals  Fair    Potential Considerations  Previous level of function    Consulted and Agree with Plan of Care  Patient       Patient will benefit from skilled therapeutic intervention in order to improve the following deficits and impairments:   Aphasia - Plan: SLP plan of care cert/re-cert  Cognitive communication deficit - Plan: SLP plan of care cert/re-cert    Problem List Patient Active Problem List   Diagnosis Date Noted  . Cerebral edema (HCC) 02/28/2019  . Atrial fibrillation (HCC) 02/28/2019  . Cigarette smoker 02/28/2019   . Family history of stroke 02/28/2019  . 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 ETOH abuse 01/28/2019  .  Pulmonary embolus (HCC) 01/28/2019  . Leg DVT (deep venous thromboembolism), acute, bilateral (HCC) 01/28/2019  . Acute pulmonary embolism (HCC) 01/28/2019    [facility] CenterCHINKE,Rushton Early ,MS, CCC-SLP  04/09/2019, 4:55 PM   [facility] [address] [place], [place], [address] Phone: [phone]   Fax:  [phone]  Name: Astrid DivineDamon C Broers MRN: [id number] Date of Birth: 1960/12/28

## 2019-04-09 NOTE — Therapy (Signed)
[facility] [address] [place], [place], [address] Phone: [phone]   Fax:  [phone]  Occupational Therapy Evaluation  Patient Details  Name: Cody Porter MRN: [id number] Date of Birth: 11-Aug-1961 Referring Provider (OT): Dr Camille Bal   Encounter Date: 04/09/2019  OT End of Session - 04/09/19 1344    Visit Number  1    Number of Visits  1    Date for OT Re-Evaluation  --   n/a   Authorization Type  no insurance, Medicaid pending    OT Start Time  1103    OT Stop Time  1131    OT Time Calculation (min)  28 min    Activity Tolerance  Patient tolerated treatment well    Behavior During Therapy  Flat affect       Past Medical History:  Diagnosis Date  . ETOH abuse 01/28/2019  . Leg DVT (deep venous thromboembolism), acute, bilateral (HCC) 01/28/2019  . Pulmonary embolus (HCC) 01/28/2019    Past Surgical History:  Procedure Laterality Date  . HERNIA REPAIR    . IR IVC FILTER PLMT / S&I /IMG GUID/MOD SED  02/25/2019  . TONSILLECTOMY      There were no vitals filed for this visit.  Subjective Assessment - 04/09/19 1109    Subjective   Not really sure why I am coming to therapy    Patient is accompanied by:  Family member   mom   Patient Stated Goals  I want my thinking to get better and my balance to get better (with guided questions)    Currently in Pain?  No/denies        [facility] OT Assessment - 04/09/19 0001      Assessment   Medical Diagnosis  B cerebellar infarcts    Referring Provider (OT)  Dr Camille Bal    Onset Date/Surgical Date  02/23/19    Hand Dominance  Right    Prior Therapy  inpt rehab to PT, OT and ST      Precautions   Precautions  Fall      Restrictions   Weight Bearing Restrictions  No      Balance Screen   Has the patient fallen in the past 6 months  No   MD reports higher level balance PT eval     Home  Environment   Family/patient expects to be  discharged to:  Private residence    Living Arrangements  Parent   mom   Available Help at Discharge  Available 24 hours/day    Type of Home  Aartment   townhouse   Home Layout  Two level    Bathroom Shower/Tub  Associate Professor    Additional Comments  shower seat      Prior Function   Level of Independence  Independent    Vocation  Full time employment    Vocation Requirements  worked at [employer] etc    Leisure  watch football      ADL   Eating/Feeding  Independent    Grooming  Independent    Engineer, drilling - Recruitment consultant -  Hygiene  Independent  ADL comments  has shower seat but isn't using at this time. Does occasionally get fatigued after showering and dressing.       IADL   Shopping  Assistance for transportation    Light Housekeeping  Maintains house alone or with occasional assistance    Meal Prep  Plans, prepares and serves adequate meals independently   mom does most of cooking but pt is able to if needed   Community Mobility  Relies on family or friends for transportation    Medication Management  Is not capable of dispensing or managing own medication   mom sets up pill box and needs reminders to take   Financial Management  Requires assistance      Mobility   Mobility Status  Independent      Written Expression   Dominant Hand  Right      Vision - History   Baseline Vision  Wears glasses all the time   reading glasses     Vision Assessment   Comment  Pt denies any visual changes       Activity Tolerance   Activity Tolerance  Tolerates 10-20 min activity with multiple rests    Activity Tolerance Comments  Pt states he can do about 15 minutes and then needs a rest      Cognition   Overall  Cognitive Status  Impaired/Different from baseline    Attention  Sustained;Selective;Alternating;Divided    Memory  Impaired    Awareness  Impaired    Problem Solving  Impaired    Executive Function  Reasoning;Decision Making    Cognition Comments  Pt slow to process info,       Sensation   Light Touch  Appears Intact    Hot/Cold  Appears Intact    Proprioception  Appears Intact      Coordination   Gross Motor Movements are Fluid and Coordinated  Yes    Fine Motor Movements are Fluid and Coordinated  Yes    Other  Slowed but coordinated movements denies any functional issues with UE coordination      Tone   Assessment Location  Right Upper Extremity;Left Upper Extremity      ROM / Strength   AROM / PROM / Strength  AROM;Strength      AROM   Overall AROM   Within functional limits for tasks performed    Overall AROM Comments  BUE's      Strength   Overall Strength  Within functional limits for tasks performed    Overall Strength Comments  BUE's      Hand Function   Right Hand Gross Grasp  Functional    Right Hand Grip (lbs)  58    Left Hand Gross Grasp  Functional    Left Hand Grip (lbs)  48      RUE Tone   RUE Tone  Within Functional Limits      LUE Tone   LUE Tone  Within Functional Limits                           OT Long Term Goals - 04/09/19 1341      OT LONG TERM GOAL #1   Title  n/a            Plan - 04/09/19 1341    Clinical Impression Statement  Pt is a 58 year old male s/p Bil cerebellar CVA's on 02/23/2019. Pt discharged home from inpt rehab  on 03/06/2019.  PMH:  alcohol abuse, PAF, h/o of PE's  Pt presents today with impaired cognition (ST to address) and higher level balance deficits (PT to address as needed).  Currently pt does exhibit any acute OT needs therefore no follow up OT recommended. Pt in agreement.      OT Occupational Profile and History  Problem Focused Assessment - Including review of records relating to  presenting problem    Occupational performance deficits (Please refer to evaluation for details):  --   none   Rehab Potential  --   n/a   Clinical Decision Making  Limited treatment options, no task modification necessary    Comorbidities Affecting Occupational Performance:  May have comorbidities impacting occupational performance    OT Frequency  --   n/a   OT Treatment/Interventions  --   n/a   Plan  no further OT needs at this time    Consulted and Agree with Plan of Care  Patient;Family member/caregiver    Family Member Consulted  mother       Patient will benefit from skilled therapeutic intervention in order to improve the following deficits and impairments:  (n/a)  Visit Diagnosis: Unsteadiness on feet    Problem List Patient Active Problem List   Diagnosis Date Noted  . Cerebral edema (HCC) 02/28/2019  . Atrial fibrillation (HCC) 02/28/2019  . Cigarette smoker 02/28/2019  . Family history of stroke 02/28/2019  . 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 ETOH abuse 01/28/2019  . Pulmonary embolus (HCC) 01/28/2019  . Leg DVT (deep venous thromboembolism), acute, bilateral (HCC) 01/28/2019  . Acute pulmonary embolism (HCC) 01/28/2019    Norton PastelPul[place]i, [place] Enke Halliday, OTR/L 04/09/2019, 1:45 PM  [facility] [address] [place], [place], [address] Phone: [phone]   Fax:  [phone]  Name: Cody Porter MRN: [id number] Date of Birth: 09-Apr-1961

## 2019-04-16 ENCOUNTER — Telehealth: Payer: Self-pay

## 2019-04-16 NOTE — Telephone Encounter (Signed)
Left vm for patients mom that pts on April 21 2019 will be change to video due to COVID 19. I stated we need verbal consent to do video and to file insurance. Also if she has a cell phone with camera we can text link to it. Left [employer] number for call back.

## 2019-04-16 NOTE — Telephone Encounter (Signed)
Pt mother called and informed they do not have Internet access. Pt mother has accepted an in office appointment for 07-01 with a check in of 08:45

## 2019-04-17 ENCOUNTER — Other Ambulatory Visit: Payer: Self-pay

## 2019-04-17 ENCOUNTER — Ambulatory Visit: Payer: Self-pay | Attending: Physical Medicine & Rehabilitation | Admitting: Physical Therapy

## 2019-04-17 ENCOUNTER — Ambulatory Visit: Payer: Self-pay

## 2019-04-17 DIAGNOSIS — R278 Other lack of coordination: Secondary | ICD-10-CM | POA: Insufficient documentation

## 2019-04-17 DIAGNOSIS — R42 Dizziness and giddiness: Secondary | ICD-10-CM | POA: Insufficient documentation

## 2019-04-17 DIAGNOSIS — M6281 Muscle weakness (generalized): Secondary | ICD-10-CM | POA: Insufficient documentation

## 2019-04-17 DIAGNOSIS — R4701 Aphasia: Secondary | ICD-10-CM | POA: Insufficient documentation

## 2019-04-17 DIAGNOSIS — R2681 Unsteadiness on feet: Secondary | ICD-10-CM | POA: Insufficient documentation

## 2019-04-17 DIAGNOSIS — R41841 Cognitive communication deficit: Secondary | ICD-10-CM | POA: Insufficient documentation

## 2019-04-17 DIAGNOSIS — R2689 Other abnormalities of gait and mobility: Secondary | ICD-10-CM | POA: Insufficient documentation

## 2019-04-17 NOTE — Patient Instructions (Signed)
Access Code: [email or web address]  URL: [email or web address]  Date: 04/17/2019  Prepared by: Bufford Lope   Exercises Romberg Sit to Stand - 12 reps - 2x daily - 7x weekly Heel Toe Raises with Counter Support - 10 reps - 2x daily - 7x weekly Single Leg Stance with Support - 5 reps - 2 sets - 10 second hold - 2x daily - 7x weekly Tandem Stance with Support - 10 reps - 4 sets - 2x daily - 7x weekly

## 2019-04-17 NOTE — Therapy (Signed)
[facility] [address] [place], [place], [address] Phone: [phone]   Fax:  [phone]  Physical Therapy Treatment  Patient Details  Name: Cody DivineDamon C Bunte MRN: [id number] Date of Birth: 1961/03/04 Referring Provider (PT): Erick ColaceAndrew E. Kirsteins, MD   Encounter Date: 04/17/2019   CLINIC OPERATION CHANGES: Outpatient Neuro Rehab is open at lower capacity following universal masking, social distancing, and patient screening.  The patient's COVID risk of complications score is 2.   PT End of Session - 04/17/19 0757    Visit Number  2    Number of Visits  9    Date for PT Re-Evaluation  06/08/19    Authorization Type  Medicaid Pending    PT Start Time  0705    PT Stop Time  0750    PT Time Calculation (min)  45 min    Activity Tolerance  Patient tolerated treatment well    Behavior During Therapy  Flat affect       Past Medical History:  Diagnosis Date  . ETOH abuse 01/28/2019  . Leg DVT (deep venous thromboembolism), acute, bilateral (HCC) 01/28/2019  . Pulmonary embolus (HCC) 01/28/2019    Past Surgical History:  Procedure Laterality Date  . HERNIA REPAIR    . IR IVC FILTER PLMT / S&I /IMG GUID/MOD SED  02/25/2019  . TONSILLECTOMY      There were no vitals filed for this visit.  Subjective Assessment - 04/17/19 0706    Subjective  Pt reports he has been able to stand up in the shower the past few days.  When he closes his eyes he gets a little dizzy.    Patient is accompained by:  Family member    Pertinent History  ETOH abuse, tobacco use, bilat LE DVT, PE with IVC filter    Limitations  Standing;Walking    Patient Stated Goals  To get rid of the dizziness.     Currently in Pain?  No/denies             Vestibular Assessment - 04/17/19 0707      Symptom Behavior   Subjective history of current problem  bilat cerebellar CVA    Type of Dizziness   Imbalance;Blurred vision    Frequency of Dizziness   intermittent    Duration of Dizziness  seconds    Symptom Nature  Intermittent    Aggravating Factors  Comment;Turning body quickly   eyes closed, stepping up on something high   Relieving Factors  Not applicable    Progression of Symptoms  Better      Oculomotor Exam   Oculomotor Alignment  Normal    Ocular ROM  WFL    Spontaneous  Absent    Gaze-induced   Absent    Smooth Pursuits  Saccades    Saccades  Dysmetria;Overshoots    Comment  Convergence impaired but pt wears reading glasses; peripheral vision intact in all fields      Oculomotor Exam-Fixation Suppressed    Left Head Impulse  negative    Right Head Impulse  positive but pt also demonstrates impaired coordination of eye movement      Vestibulo-Ocular Reflex   VOR Cancellation  Corrective saccades      Visual Acuity   Static  6    Dynamic  4   2 line difference - WFL     Positional Sensitivities   Nose to Right Knee  No dizziness    Right Knee to Sitting  No dizziness    Nose to Left Knee  No dizziness    Left Knee to Sitting  No dizziness    Head Turning x 5  No dizziness    Head Nodding x 5  No dizziness    Pivot Right in Standing  No dizziness    Pivot Left in Standing  No dizziness         Access Code: [email or web address]  URL: [email or web address]  Date: 04/17/2019  Prepared by: Bufford Lope   Exercises Romberg Sit to Stand - 12 reps - 2x daily - 7x weekly Heel Toe Raises with Counter Support - 10 reps - 2x daily - 7x weekly Single Leg Stance with Support - 5 reps - 2 sets - 10 second hold - 2x daily - 7x weekly Tandem Stance with Support - 10 reps - 4 sets - 2x daily - 7x weekly      PT Education - 04/17/19 0756    Education Details  vestibular findings and impairments in eye coordination; balance and strengthening HEP    Person(s) Educated  Patient    Methods  Explanation;Demonstration;Handout    Comprehension  Verbalized understanding;Returned demonstration          PT Long  Term Goals - 04/09/19 1257      PT LONG TERM GOAL #1   Title  Pt will demonstrate independence with balance and vestibular HEP    Baseline  dependent    Time  8    Period  Weeks    Status  New    Target Date  06/08/19      PT LONG TERM GOAL #2   Title  Pt will demonstrate improved bilat LE strength as indicated by improvement in Five time sit to stand to </= 15 seconds    Baseline  21.44 seconds without use of UE    Time  8    Period  Weeks    Status  New    Target Date  06/08/19      PT LONG TERM GOAL #3   Title  Pt will demonstrate decreased falls risk as indicated by improvement in BERG to >/= 53/56    Baseline  49/56    Time  8    Period  Weeks    Status  New    Target Date  06/08/19      PT LONG TERM GOAL #4   Title  Pt will demonstrate decreased falls risk during ambulation as indicated by improvement in FGA to >/= 23/30    Baseline  19/30    Time  8    Period  Weeks    Status  New    Target Date  06/08/19      PT LONG TERM GOAL #5   Title  Pt will perform ambulation outdoors x 1000' over uneven paved and grassy surfaces, up/down curb and reaching down to ground to pick up items (without dizziness) independently to indicate safe return to work.    Baseline  115' indoors with supervision; dizziness with reaching down to floor    Time  8    Period  Weeks    Status  New    Target Date  06/08/19            Plan - 04/17/19 0757    Clinical Impression Statement  Performed further assessment of vestibular function and impairments after CVA.  Unable to provoke symptoms with various movements; pt does have impaired oculomotor coordination.  Initiated LE  strengthening and dynamic balance training.  Will continue to add to HEP and will continue to address impairments to decrease falls risk.    Personal Factors and Comorbidities  Comorbidity 3+;Finances;Profession    Comorbidities  ETOH abuse, tobacco use, bilat LE DVT, PE with IVC filter    Examination-Activity  Limitations  Bend;Locomotion Level;Stand    Examination-Participation Restrictions  Community Activity;Yard Work    Stability/Clinical Decision Making  Stable/Uncomplicated    Rehab Potential  Good    PT Frequency  1x / week    PT Duration  8 weeks    PT Treatment/Interventions  ADLs/Self Care Home Management;Gait training;Stair training;Functional mobility training;Therapeutic activities;Therapeutic exercise;Balance training;Neuromuscular re-education;Patient/family education;Vestibular    PT Next Visit Plan  Add to HEP: stairs/step ups, compliant surface, picking up items from ground, walk with head turns, eyes closed    Consulted and Agree with Plan of Care  Patient;Family member/caregiver    Family Member Consulted  mother       Patient will benefit from skilled therapeutic intervention in order to improve the following deficits and impairments:  Abnormal gait, Decreased balance, Decreased coordination, Decreased strength, Difficulty walking, Dizziness  Visit Diagnosis: Dizziness and giddiness  Other abnormalities of gait and mobility  Other lack of coordination  Muscle weakness (generalized)  Unsteadiness on feet     Problem List Patient Active Problem List   Diagnosis Date Noted  . Cerebral edema (HCC) 02/28/2019  . Atrial fibrillation (HCC) 02/28/2019  . Cigarette smoker 02/28/2019  . Family history of stroke 02/28/2019  . 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 ETOH abuse 01/28/2019  . Pulmonary embolus (HCC) 01/28/2019  . Leg DVT (deep venous thromboembolism), acute, bilateral (HCC) 01/28/2019  . Acute pulmonary embolism (HCC) 01/28/2019    Dierdre Highman, PT, DPT 04/17/19    8:01 AM    [facility] [address] [place], [place], [address] Phone: [phone]   Fax:  [phone]  Name: OUMAR BRUENING MRN: [id number] Date of  Birth: [birth year]

## 2019-04-17 NOTE — Therapy (Signed)
[facility] [address] [place], [place], [address] Phone: [phone]   Fax:  [phone]  Speech Language Pathology Treatment  Patient Details  Name: YORDAN WARZECHA MRN: [id number] Date of Birth: 1961-02-24 Referring Provider (SLP): Claudette Laws, MD   Encounter Date: 04/17/2019  End of Session - 04/17/19 0857    Visit Number  2    Number of Visits  17    Date for SLP Re-Evaluation  07/02/19    SLP Start Time  0806    SLP Stop Time   0846    SLP Time Calculation (min)  40 min    Activity Tolerance  Patient tolerated treatment well       Past Medical History:  Diagnosis Date  . ETOH abuse 01/28/2019  . Leg DVT (deep venous thromboembolism), acute, bilateral (HCC) 01/28/2019  . Pulmonary embolus (HCC) 01/28/2019    Past Surgical History:  Procedure Laterality Date  . HERNIA REPAIR    . IR IVC FILTER PLMT / S&I /IMG GUID/MOD SED  02/25/2019  . TONSILLECTOMY      There were no vitals filed for this visit.         ADULT SLP TREATMENT - 04/17/19 0850      General Information   Behavior/Cognition  Alert;Cooperative;Pleasant mood      Treatment Provided   Treatment provided  Cognitive-Linquistic      Cognitive-Linquistic Treatment   Treatment focused on  Aphasia    Skilled Treatment  Pt's verbal expression was targeted today in skilled ST - divergent naming: pt was able to provide 10 items from a simple category in one minute with mod extra time  (90 seconds) and rare min A from SLP. Pt thought of average 7 items prior to reqiuiring SLP A. SLP educated pt on semantic feature analysis (SFA) technique and performed 3 with him. Pt's decr'd cognition was noted as pt often stated the same thing for each category (e.g. "It's for looking at roads", "It has stuff for looking at roads", "You would look for the different roads"). As task progressed pt answers were more variable and correct for each feature, however  pt still req'd min cues rarely by end of session.SLP provided words/objects for pt to plug into SFA and provided two examples for pt before he left the clinic. Pt stated he understood the homework.      Assessment / Recommendations / Plan   Plan  Continue with current plan of care      Progression Toward Goals   Progression toward goals  Progressing toward goals       SLP Education - 04/17/19 0857    Education Details  semantic feature analysis    Person(s) Educated  Patient    Methods  Explanation    Comprehension  Verbalized understanding;Need further instruction       SLP Short Term Goals - 04/17/19 0859      SLP SHORT TERM GOAL #1   Title  pt will provide functional verbal desription of two compensations for anomia over two sessions    Time  4    Period  Weeks    Status  On-going      SLP SHORT TERM GOAL #2   Title  pt will complete simple to mod complex naming tasks with 90% success and modified independence    Time  4    Period  Weeks    Status  On-going       SLP Long  Term Goals - 04/17/19 0859      SLP LONG TERM GOAL #1   Title  pt score on [place] Naming Test will improve to >46/60    Time  8    Period  Weeks    Status  On-going      SLP LONG TERM GOAL #2   Title  pt will engage functionally in mod complex conversation with modified independence (compensation use) x 3 sessions    Time  8    Period  Weeks    Status  On-going       Plan - 04/17/19 0857    Clinical Impression Statement  Pt cont to present today with mild (to mod depending on subject) anomia in conversation, with noted cognitive communication deficits at baseline. See "skilled intervention" for details. Pt would benefit from skilled ST addressing expressive verbal language skills as well as compensations in conversation. During the day, pt enjoys watching sports and walking to business establishments near to home.    Speech Therapy Frequency  2x / week    Duration  --   8 weeks (or 17  sessions), however frequenc/duration to be at x1/week for 8 weeks due to pt preference. After Medicaid approved pt stated he may desire incr to x2/week.   Treatment/Interventions  Patient/family education;SLP instruction and feedback;Internal/external aids;Compensatory techniques;Language facilitation;Cueing hierarchy;Functional tasks    Potential to Achieve Goals  Fair    Potential Considerations  Previous level of function    Consulted and Agree with Plan of Care  Patient       Patient will benefit from skilled therapeutic intervention in order to improve the following deficits and impairments:   Aphasia  Cognitive communication deficit    Problem List Patient Active Problem List   Diagnosis Date Noted  . Cerebral edema (HCC) 02/28/2019  . Atrial fibrillation (HCC) 02/28/2019  . Cigarette smoker 02/28/2019  . Family history of stroke 02/28/2019  . 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 ETOH abuse 01/28/2019  . Pulmonary embolus (HCC) 01/28/2019  . Leg DVT (deep venous thromboembolism), acute, bilateral (HCC) 01/28/2019  . Acute pulmonary embolism (HCC) 01/28/2019    [facility]IN[facility],CARL ,MS, CCC-SLP  04/17/2019, 8:59 AM  [facility] [address] [place], [place], [address] Phone: [phone]   Fax:  [phone]   Name: Astrid DivineDamon C Johnson MRN: [id number] Date of Birth: 12/07/1960

## 2019-04-17 NOTE — Patient Instructions (Signed)
  Please complete the assigned speech therapy homework prior to your next session and return it to the speech therapist at your next visit.  

## 2019-04-21 ENCOUNTER — Inpatient Hospital Stay: Payer: Self-pay | Admitting: Adult Health

## 2019-04-23 ENCOUNTER — Other Ambulatory Visit: Payer: Self-pay

## 2019-04-23 ENCOUNTER — Ambulatory Visit: Payer: Self-pay | Admitting: Physical Therapy

## 2019-04-23 ENCOUNTER — Ambulatory Visit: Payer: Self-pay

## 2019-04-23 DIAGNOSIS — M6281 Muscle weakness (generalized): Secondary | ICD-10-CM

## 2019-04-23 DIAGNOSIS — R4701 Aphasia: Secondary | ICD-10-CM

## 2019-04-23 DIAGNOSIS — R2689 Other abnormalities of gait and mobility: Secondary | ICD-10-CM

## 2019-04-23 DIAGNOSIS — R2681 Unsteadiness on feet: Secondary | ICD-10-CM

## 2019-04-23 DIAGNOSIS — R41841 Cognitive communication deficit: Secondary | ICD-10-CM

## 2019-04-23 DIAGNOSIS — R42 Dizziness and giddiness: Secondary | ICD-10-CM

## 2019-04-23 DIAGNOSIS — R278 Other lack of coordination: Secondary | ICD-10-CM

## 2019-04-23 NOTE — Patient Instructions (Signed)
Access Code: [email or web address]  URL: [email or web address]  Date: 04/23/2019  Prepared by: Misty Stanley   Exercises Romberg Sit to Stand - 12 reps - 2x daily - 7x weekly Heel Toe Raises with Counter Support - 10 reps - 2x daily - 7x weekly Single Leg Stance with Support - 5 reps - 2 sets - 10 second hold - 2x daily - 7x weekly Tandem Stance with Support - 10 reps - 4 sets - 2x daily - 7x weekly Supine Lower Trunk Rotation - 10 reps - 1 sets - 2x daily - 7x weekly Hooklying Single Knee to Chest - 2 sets - 30 second hold - 2x daily - 7x weekly Supine Hamstring Stretch with Strap - 2 sets - 20 seconds hold - 2x daily - 7x weekly Seated Alternating Side Stretch with Arm Overhead - 2 sets - 20 seconds hold - 2x daily - 7x weekly Seated Flexion Stretch - 3 sets - 20 seconds hold - 2x daily - 7x weekly

## 2019-04-23 NOTE — Therapy (Signed)
[facility] [address] [place], [place], [address] Phone: [phone]   Fax:  [phone]  Physical Therapy Treatment  Patient Details  Name: Cody Porter MRN: [id number] Date of Birth: 1961-04-02 Referring Provider (PT): Erick ColaceAndrew E. Kirsteins, MD  CLINIC OPERATION CHANGES: Outpatient Neuro Rehab is open at lower capacity following universal masking, social distancing, and patient screening.  The patient's COVID risk of complications score is 2.   Encounter Date: 04/23/2019  PT End of Session - 04/23/19 1117    Visit Number  3    Number of Visits  9    Date for PT Re-Evaluation  06/08/19    Authorization Type  Medicaid Pending    PT Start Time  0901    PT Stop Time  0944    PT Time Calculation (min)  43 min    Activity Tolerance  Patient tolerated treatment well;Patient limited by pain    Behavior During Therapy  Flat affect       Past Medical History:  Diagnosis Date  . ETOH abuse 01/28/2019  . Leg DVT (deep venous thromboembolism), acute, bilateral (HCC) 01/28/2019  . Pulmonary embolus (HCC) 01/28/2019    Past Surgical History:  Procedure Laterality Date  . HERNIA REPAIR    . IR IVC FILTER PLMT / S&I /IMG GUID/MOD SED  02/25/2019  . TONSILLECTOMY      There were no vitals filed for this visit.  Subjective Assessment - 04/23/19 0901    Subjective  Pt reports he has not done his exercises since therapy last week. States his lower back has been painful since lifting a TV 6 days ago.     Patient is accompained by:  Family member    Pertinent History  ETOH abuse, tobacco use, bilat LE DVT, PE with IVC filter    Limitations  Standing;Walking    Patient Stated Goals  To get rid of the dizziness.     Currently in Pain?  Yes    Pain Score  10-Worst pain ever    Pain Location  Back    Pain Orientation  Left    Pain Descriptors / Indicators  Sore;Radiating   Radiates down both legs when going from sit <> stand    Pain Type  Acute pain    Pain Radiating Towards  bilateral posterior LE down to ankle when performing sit <> stands    Aggravating Factors   Sit <> stands     Pain Relieving Factors  Tylenol          [facility] PT Assessment - 04/23/19 0908      ROM / Strength   AROM / PROM / Strength  AROM      AROM   Overall AROM   Deficits    Overall AROM Comments  50% limited in R + L side bending, increased L back pain with left side bending, 75% limited in extension    AROM Assessment Site  Lumbar      Special Tests    Special Tests  Lumbar    Lumbar Tests  Slump Test      Slump test   Findings  Negative    Side  --   Bilaterally   Comment  Feels a stretch         Access Code: [email or web address]      URL: [email or web address]    Date: 04/23/2019  Prepared by: Bufford LopeAudra Potter    Added exercises below to address patient's complaint of acute  low back pain L>R. Demonstration and explanation provided for each exercise and how to perform safely at home.  Attempted seated at edge of mat hamstring stretch bilaterally, however patient unable to perform due to decreased upper thoracic strength/endurance to hold trunk erect to perform correctly.   Exercises Supine Lower Trunk Rotation - 10 reps - 1 sets - 2x daily - 7x weekly Hooklying Single Knee to Chest - 2 sets - 30 second hold - 2x daily - 7x weekly  Using a belt loop, requires verbal and tactile cues in order to perform with correct form.  Verbal cues needed to perform slowly.  Supine Hamstring Stretch with Strap - 2 sets - 20 seconds hold - 2x daily - 7x weekly Seated Alternating Side Stretch with Arm Overhead - 2 sets - 20 seconds hold - 2x daily - 7x weekly Seated Flexion Stretch - 3 sets - 20 seconds hold - 2x daily - 7x weekly                   PT Education - 04/23/19 0952    Education Details  Provided new HEP to include stretches for low back pain. Discussed with patient importance of walking/moving in order  to improve circulation to help improve low back pain.     Person(s) Educated  Patient    Methods  Explanation;Demonstration;Handout;Verbal cues    Comprehension  Verbalized understanding;Returned demonstration;Verbal cues required          PT Long Term Goals - 04/09/19 1257      PT LONG TERM GOAL #1   Title  Pt will demonstrate independence with balance and vestibular HEP    Baseline  dependent    Time  8    Period  Weeks    Status  New    Target Date  06/08/19      PT LONG TERM GOAL #2   Title  Pt will demonstrate improved bilat LE strength as indicated by improvement in Five time sit to stand to </= 15 seconds    Baseline  21.44 seconds without use of UE    Time  8    Period  Weeks    Status  New    Target Date  06/08/19      PT LONG TERM GOAL #3   Title  Pt will demonstrate decreased falls risk as indicated by improvement in BERG to >/= 53/56    Baseline  49/56    Time  8    Period  Weeks    Status  New    Target Date  06/08/19      PT LONG TERM GOAL #4   Title  Pt will demonstrate decreased falls risk during ambulation as indicated by improvement in FGA to >/= 23/30    Baseline  19/30    Time  8    Period  Weeks    Status  New    Target Date  06/08/19      PT LONG TERM GOAL #5   Title  Pt will perform ambulation outdoors x 1000' over uneven paved and grassy surfaces, up/down curb and reaching down to ground to pick up items (without dizziness) independently to indicate safe return to work.    Baseline  115' indoors with supervision; dizziness with reaching down to floor    Time  8    Period  Weeks    Status  New    Target Date  06/08/19  Plan - 04/23/19 1118    Clinical Impression Statement  Performed lumbar AROM and Slump Test due to patient reporting increased lower back pain and occasional radicular symptoms into bilateral LE. Provided patient with additions to HEP to include stretches for hamstrings, lumbar paraspinals, and quadratus  lumborum. Patient reported 5/10 low back pain at end of session (10/10 at start of session). Will continue to assess low back pain and will re-visit previous balance exercises once patient's pain is at a more tolerable level to address impairments and decrease patient's risk of falls.      Personal Factors and Comorbidities  Comorbidity 3+;Finances;Profession    Comorbidities  ETOH abuse, tobacco use, bilat LE DVT, PE with IVC filter    Examination-Activity Limitations  Bend;Locomotion Level;Stand    Examination-Participation Restrictions  Community Activity;Yard Work    Stability/Clinical Decision Making  Stable/Uncomplicated    Rehab Potential  Good    PT Frequency  1x / week    PT Duration  8 weeks    PT Treatment/Interventions  ADLs/Self Care Home Management;Gait training;Stair training;Functional mobility training;Therapeutic activities;Therapeutic exercise;Balance training;Neuromuscular re-education;Patient/family education;Vestibular    PT Next Visit Plan  Review HEP for low back pain. Review previous balance HEP if patient's back pain is more tolerable.  Stairs/step ups, compliant surface, picking up items from ground, walk with head turns, eyes closed, static balance with eyes closed, x1 viewing.    Consulted and Agree with Plan of Care  Patient       Patient will benefit from skilled therapeutic intervention in order to improve the following deficits and impairments:  Abnormal gait, Decreased balance, Decreased coordination, Decreased strength, Difficulty walking, Dizziness  Visit Diagnosis: Dizziness and giddiness  Other abnormalities of gait and mobility  Other lack of coordination  Unsteadiness on feet  Muscle weakness (generalized)     Problem List Patient Active Problem List   Diagnosis Date Noted  . Cerebral edema (HCC) 02/28/2019  . Atrial fibrillation (HCC) 02/28/2019  . Cigarette smoker 02/28/2019  . Family history of stroke 02/28/2019  . ICH (intracerebral  hemorrhage) (HCC) B cerebellar and L external capsule hmg while on Eliquis 02/23/2019  . ETOH abuse 01/28/2019  . Pulmonary embolus (HCC) 01/28/2019  . Leg DVT (deep venous thromboembolism), acute, bilateral (HCC) 01/28/2019  . Acute pulmonary embolism (HCC) 01/28/2019    Drake Leachhloe N Skyelyn Scruggs, PT, DPT 04/23/2019, 12:09 PM  [facility] [address] [place], [place], [address] Phone: [phone]   Fax:  [phone]  Name: Cody Porter MRN: [id number] Date of Birth: [birth year]

## 2019-04-24 NOTE — Therapy (Signed)
[facility] [address] [place], [place], [address] Phone: [phone]   Fax:  [phone]  Speech Language Pathology Treatment  Patient Details  Name: Cody Porter MRN: [id number] Date of Birth: 07-May-1961 Referring Provider (SLP): Claudette LawsKirsteins, Andrew, MD   Encounter Date: 04/23/2019  End of Session - 04/24/19 1725    Visit Number  3    Number of Visits  17    Date for SLP Re-Evaluation  07/02/19    SLP Start Time  1005    SLP Stop Time   1045    SLP Time Calculation (min)  40 min    Activity Tolerance  Patient tolerated treatment well       Past Medical History:  Diagnosis Date  . ETOH abuse 01/28/2019  . Leg DVT (deep venous thromboembolism), acute, bilateral (HCC) 01/28/2019  . Pulmonary embolus (HCC) 01/28/2019    Past Surgical History:  Procedure Laterality Date  . HERNIA REPAIR    . IR IVC FILTER PLMT / S&I /IMG GUID/MOD SED  02/25/2019  . TONSILLECTOMY      There were no vitals filed for this visit.  Subjective Assessment - 04/23/19 1000    Subjective  "She said I did it wrong so she blacked it out." "I didn't do any of them."    Pain Score  10-Worst pain ever    Pain Location  Back    Pain Orientation  Left    Pain Descriptors / Indicators  Sore;Radiating    Pain Type  Acute pain            ADULT SLP TREATMENT - 04/24/19 0001      General Information   Behavior/Cognition  Alert;Cooperative;Pleasant mood      Cognitive-Linquistic Treatment   Treatment focused on  Aphasia    Skilled Treatment  Due to pt not completing any of the [facility] S[facility] went through 6 items with semantic feature analysis (SFA). SLP provided pt with mod cues usually for task completion due to organization/attention and langauge deficits and pt understanding of task. SLP provided detailed written directinos for mother in order to assist pt, with instructions to do so at home. SLP worked with pt's anomia by having him  describe people and places he visits [address], and provided written support for cueing for subsequent people/places. Pt appeared to use these visual cues from previous items occasionally.       Assessment / Recommendations / Plan   Plan  Continue with current plan of care      Progression Toward Goals   Progression toward goals  Progressing toward goals         SLP Short Term Goals - 04/24/19 1726      SLP SHORT TERM GOAL #1   Title  pt will provide functional verbal desription of two compensations for anomia over two sessions    Time  3    Period  Weeks    Status  On-going      SLP SHORT TERM GOAL #2   Title  pt will complete simple to mod complex naming tasks with 90% success and modified independence    Time  3    Period  Weeks    Status  On-going       SLP Long Term Goals - 04/24/19 1727      SLP LONG TERM GOAL #1   Title  pt score on [employer] Naming Test will improve to >46/60  Time  7    Period  Weeks    Status  On-going      SLP LONG TERM GOAL #2   Title  pt will engage functionally in mod complex conversation with modified independence (compensation use) x 3 sessions    Time  7    Period  Weeks    Status  On-going       Plan - 04/24/19 1726    Clinical Impression Statement  Pt cont to present today with mild anomia in conversation, with noted cognitive communication deficits at baseline. See "skilled intervention" for details. Pt would benefit from skilled ST addressing expressive verbal language skills as well as compensations in conversation. During the day, pt enjoys watching sports and walking to business establishments near to home.    Speech Therapy Frequency  2x / week    Duration  --   8 weeks (or 17 sessions), however frequenc/duration to be at x1/week for 8 weeks due to pt preference. After Medicaid approved pt stated he may desire incr to x2/week.   Treatment/Interventions  Patient/family education;SLP instruction and  feedback;Internal/external aids;Compensatory techniques;Language facilitation;Cueing hierarchy;Functional tasks    Potential to Achieve Goals  Fair    Potential Considerations  Previous level of function    Consulted and Agree with Plan of Care  Patient       Patient will benefit from skilled therapeutic intervention in order to improve the following deficits and impairments:   Aphasia  Cognitive communication deficit    Problem List Patient Active Problem List   Diagnosis Date Noted  . Cerebral edema ([place]) 02/28/2019  . Atrial fibrillation ([place]) 02/28/2019  . Cigarette smoker 02/28/2019  . Family history of stroke 02/28/2019  . ICH (intracerebral hemorrhage) ([place]) B cerebellar and L external capsule hmg while on Eliquis 02/23/2019  . ETOH abuse 01/28/2019  . Pulmonary embolus ([place]) 01/28/2019  . Leg DVT (deep venous thromboembolism), acute, bilateral ([place]) 01/28/2019  . Acute pulmonary embolism ([place]) 01/28/2019    [facility] ,MS, [place]  04/24/2019, 5:27 PM  [place] [address] [place], [place], [address] Phone: [phone]   Fax:  [phone]   Name: Cody Porter MRN: [id number] Date of Birth: [birth year]

## 2019-04-29 ENCOUNTER — Other Ambulatory Visit: Payer: Self-pay

## 2019-04-29 ENCOUNTER — Ambulatory Visit: Payer: Self-pay | Admitting: Physical Therapy

## 2019-04-29 ENCOUNTER — Ambulatory Visit: Payer: Self-pay

## 2019-04-29 DIAGNOSIS — R4701 Aphasia: Secondary | ICD-10-CM

## 2019-04-29 DIAGNOSIS — R2681 Unsteadiness on feet: Secondary | ICD-10-CM

## 2019-04-29 DIAGNOSIS — R2689 Other abnormalities of gait and mobility: Secondary | ICD-10-CM

## 2019-04-29 DIAGNOSIS — M6281 Muscle weakness (generalized): Secondary | ICD-10-CM

## 2019-04-29 DIAGNOSIS — R41841 Cognitive communication deficit: Secondary | ICD-10-CM

## 2019-04-29 NOTE — Patient Instructions (Signed)
  Please see if your mom can join [place] next time for speech therapy.

## 2019-04-29 NOTE — Therapy (Signed)
[facility] [address] [place], [place], [address] Phone: [phone]   Fax:  [phone]  Speech Language Pathology Treatment  Patient Details  Name: Cody Porter MRN: [id number] Date of Birth: [birth year] Referring Provider (SLP): Alysia Penna, MD   Encounter Date: 04/29/2019  End of Session - 04/29/19 1202    Visit Number  4    Number of Visits  17    Date for SLP Re-Evaluation  07/02/19    SLP Start Time  1104    SLP Stop Time   1145    SLP Time Calculation (min)  41 min    Activity Tolerance  Patient tolerated treatment well       Past Medical History:  Diagnosis Date  . ETOH abuse 01/28/2019  . Leg DVT (deep venous thromboembolism), acute, bilateral ([place]) 01/28/2019  . Pulmonary embolus ([place]) 01/28/2019    Past Surgical History:  Procedure Laterality Date  . HERNIA REPAIR    . IR IVC FILTER PLMT / S&I /IMG GUID/MOD SED  02/25/2019  . TONSILLECTOMY      There were no vitals filed for this visit.  Subjective Assessment - 04/29/19 1108    Subjective  "That was hard, wasn't it? Cody Porter been easier if I woulda explained it more." (pt, after SLP did pt's exercises solely on pt description).            ADULT SLP TREATMENT - 04/29/19 1131      General Information   Behavior/Cognition  Alert;Cooperative;Pleasant mood      Treatment Provided   Treatment provided  Cognitive-Linquistic      Cognitive-Linquistic Treatment   Treatment focused on  Aphasia    Skilled Treatment  To expand pt's language use, SLP had pt describe how to perform his exercises. Pt cont to say, "like this" and point to his handout throughout his explanation, fading in frequency from usually to occasionally. Pt req'd SLP mod cues for more exact language to perform exericses correctly. Afterwards pt responded, "I just forgot I needed to give you more directions." when given the choice between "forgot to give more directions" and  "hard to say the directions"..SLP had pt describe pictures - min A occasionally needed from SLP for elaboration on details of pictures. -      Assessment / Recommendations / Plan   Plan  Continue with current plan of care      Progression Toward Goals   Progression toward goals  Progressing toward goals   SLP ?s if pt is at or near baseline        SLP Short Term Goals - 04/29/19 1203      SLP SHORT TERM GOAL #1   Title  pt will provide functional verbal desription of two compensations for anomia over two sessions    Time  2    Period  Weeks    Status  On-going      SLP SHORT TERM GOAL #2   Title  pt will complete simple to mod complex naming tasks with 90% success and modified independence    Time  2    Period  Weeks    Status  On-going       SLP Long Term Goals - 04/29/19 1203      SLP LONG TERM GOAL #1   Title  pt score on [place] will improve to >46/60    Time  6    Period  Weeks    Status  On-going      SLP LONG TERM GOAL #2   Title  pt will engage functionally in mod complex conversation with modified independence (compensation use) x 3 sessions    Time  6    Period  Weeks    Status  On-going       Plan - 04/29/19 1202    Clinical Impression Statement  Pt cont to present today with mild anomia in conversation, with noted cognitive communication deficits at baseline. SLP wonders if pt is at baseline - requested mother arrive next session with pt. She did not assist pt lat week with homework despite SLP leaving specific notes for her to do so. See "skilled intervention" for details. Pt would benefit from skilled ST addressing expressive verbal language skills as well as compensations in conversation. During the day, pt enjoys watching sports and walking to business establishments near to home.    Speech Therapy Frequency  2x / week    Duration  --   8 weeks (or 17 sessions), however frequenc/duration to be at x1/week for 8 weeks due to pt preference.  After Medicaid approved pt stated he may desire incr to x2/week.   Treatment/Interventions  Patient/family education;SLP instruction and feedback;Internal/external aids;Compensatory techniques;Language facilitation;Cueing hierarchy;Functional tasks    Potential to Achieve Goals  Fair    Potential Considerations  Previous level of function    Consulted and Agree with Plan of Care  Patient       Patient will benefit from skilled therapeutic intervention in order to improve the following deficits and impairments:   1. Aphasia   2. Cognitive communication deficit       Problem List Patient Active Problem List   Diagnosis Date Noted  . Cerebral edema (HCC) 02/28/2019  . Atrial fibrillation (HCC) 02/28/2019  . Cigarette smoker 02/28/2019  . Family history of stroke 02/28/2019  . 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 ETOH abuse 01/28/2019  . Pulmonary embolus (HCC) 01/28/2019  . Leg DVT (deep venous thromboembolism), acute, bilateral (HCC) 01/28/2019  . Acute pulmonary embolism (HCC) 01/28/2019    Mavi Un ,MS, CCC-SLP  04/29/2019, 12:04 PM  [facility] [address] [place], [place], [address] Phone: [phone]   Fax:  [phone]   Name: Cody Porter MRN: [id number] Date of Birth: [birth year]

## 2019-04-30 ENCOUNTER — Other Ambulatory Visit: Payer: Self-pay | Admitting: Physical Medicine & Rehabilitation

## 2019-04-30 NOTE — Therapy (Signed)
[facility] [address] [facility] [address] [address] [place], [place], [address] Phone: [phone]   Fax:  [phone]  Physical Therapy Treatment  Patient Details  Name: Cody DivineDamon C Boening MRN: [id number] Date of Birth: [birth year] Referring Provider (PT): Erick ColaceAndrew E. Kirsteins, MD  CLINIC OPERATION CHANGES: Outpatient Neuro Rehab is open at lower capacity following universal masking, social distancing, and patient screening.  The patient's COVID risk of  complications score is 2.  Encounter Date: 04/29/2019  PT End of Session - 04/30/19 1831    Visit Number  4    Number of Visits  9    Date for PT Re-Evaluation  06/08/19    Authorization Type  Medicaid Pending    PT Start Time  1002    PT Stop Time  1048    PT Time Calculation (min)  46 min    Activity Tolerance  Patient tolerated treatment well    Behavior During Therapy  WFL for tasks assessed/performed       Past Medical History:  Diagnosis Date  . ETOH abuse 01/28/2019  . Leg DVT (deep venous thromboembolism), acute, bilateral (HCC) 01/28/2019  . Pulmonary embolus (HCC) 01/28/2019    Past Surgical History:  Procedure Laterality Date  . HERNIA REPAIR    . IR IVC FILTER PLMT / S&I /IMG GUID/MOD SED  02/25/2019  . TONSILLECTOMY      There were no vitals filed for this visit.                    [facility] Adult PT Treatment/Exercise - 04/30/19 0001      Exercises   Exercises  Lumbar;Knee/Hip      Lumbar Exercises: Stretches   Active Hamstring Stretch  Right;Left;1 rep;30 seconds   seated in chair with back support   Lower Trunk Rotation  2 reps;20 seconds    Other Lumbar Stretch Exercise  single and double knee to chest  - 2 reps each with 10 sec hold - RLE and LLE      Lumbar Exercises: Seated   Other Seated Lumbar Exercises  pelvic rocking 10 reps on mat     Other Seated Lumbar Exercises  rolling red physioball forward for lumbar stretching; added rolling ball to Rt and  Lt sides for contralateral stretch       Lumbar Exercises: Supine   Pelvic Tilt  10 reps;5 seconds    Bridge  Limitations   attempted but pt stated too painful to do      Knee/Hip Exercises: Stretches   Active Hamstring Stretch  Both;1 rep;30 seconds   runner's stretch          Balance Exercises - 04/30/19 1828      Balance Exercises: Standing   Standing Eyes Closed  Wide (BOA);Head turns;Solid surface;5 reps    Rockerboard  Anterior/posterior;EO;10 reps;UE support    Step Ups  Forward;6 inch;UE support 1   5 reps each leg   Balance Beam  standing perpendicular on blue foam balance beam for improved hip strategy - head turns side to side with EO with UE support on // bars    Other Standing Exercises  heel raises bil. LE 10 reps; amb. 10' x 2 reps on tip toes inside // bars             PT Long Term Goals - 04/30/19 1835      PT LONG TERM GOAL #1   Title  Pt will demonstrate independence with balance and vestibular  HEP    Baseline  dependent    Time  8    Period  Weeks    Status  New      PT LONG TERM GOAL #2   Title  Pt will demonstrate improved bilat LE strength as indicated by improvement in Five time sit to stand to </= 15 seconds    Baseline  21.44 seconds without use of UE    Time  8    Period  Weeks    Status  New      PT LONG TERM GOAL #3   Title  Pt will demonstrate decreased falls risk as indicated by improvement in BERG to >/= 53/56    Baseline  49/56    Time  8    Period  Weeks    Status  New      PT LONG TERM GOAL #4   Title  Pt will demonstrate decreased falls risk during ambulation as indicated by improvement in FGA to >/= 23/30    Baseline  19/30    Time  8    Period  Weeks    Status  New      PT LONG TERM GOAL #5   Title  Pt will perform ambulation outdoors x 1000' over uneven paved and grassy surfaces, up/down curb and reaching down to ground to pick up items (without dizziness) independently to indicate safe return to work.     Baseline  115' indoors with supervision; dizziness with reaching down to floor    Time  8    Period  Weeks    Status  New            Plan - 04/30/19 1832    Clinical Impression Statement  Pt reported no change in back pain at beginning and end of today's PT session - intensity rating 5/10 with dizziness rating 8/10 during session; pt unable to perform bridge ex. due to c/o low back pain; pt able to perform standing balance exercises with no increased c/o low back pain    Personal Factors and Comorbidities  Comorbidity 3+;Finances;Profession    Comorbidities  ETOH abuse, tobacco use, bilat LE DVT, PE with IVC filter    Examination-Activity Limitations  Bend;Locomotion Level;Stand    Examination-Participation Restrictions  Community Activity;Yard Work    Stability/Clinical Decision Making  Stable/Uncomplicated    Rehab Potential  Good    PT Frequency  1x / week    PT Duration  8 weeks    PT Treatment/Interventions  ADLs/Self Care Home Management;Gait training;Stair training;Functional mobility training;Therapeutic activities;Therapeutic exercise;Balance training;Neuromuscular re-education;Patient/family education;Vestibular    PT Next Visit Plan  cont balance and strengthening exercises    Consulted and Agree with Plan of Care  Patient       Patient will benefit from skilled therapeutic intervention in order to improve the following deficits and impairments:  Abnormal gait, Decreased balance, Decreased coordination, Decreased strength, Difficulty walking, Dizziness  Visit Diagnosis: 1. Other abnormalities of gait and mobility   2. Muscle weakness (generalized)   3. Unsteadiness on feet        Problem List Patient Active Problem List   Diagnosis Date Noted  . Cerebral edema (HCC) 02/28/2019  . Atrial fibrillation (HCC) 02/28/2019  . Cigarette smoker 02/28/2019  . Family history of stroke 02/28/2019  . ICH (intracerebral hemorrhage) (HCC) B cerebellar and L external capsule  hmg while on Eliquis 02/23/2019  . ETOH abuse 01/28/2019  . Pulmonary embolus (HCC) 01/28/2019  . Leg  DVT (deep venous thromboembolism), acute, bilateral ([place]) 01/28/2019  . Acute pulmonary embolism ([place]) 01/28/2019    Trevel Dillenbeck, [place], PT 04/30/2019, 6:37 PM  [facility] [address] [place], [place], [address] Phone: [phone]   Fax:  [phone]  Name: MARKEY DEADY MRN: [id number] Date of Birth: [birth year]

## 2019-05-05 ENCOUNTER — Telehealth: Payer: Self-pay | Admitting: *Deleted

## 2019-05-05 NOTE — Telephone Encounter (Signed)
Mr Clinkscales's mother called about his medications. He only has about 3 days of meds left and he has not gotten his medicaid yet and has no insurance and therefore has not been able to see Dr Coletta Memos.  His mother is trying to pay for now and she has to do this with her SS which is their only income. I have advised them to try and call [facility] to see if he can be seen there. She will try that and if not she will call back.

## 2019-05-08 ENCOUNTER — Other Ambulatory Visit: Payer: Self-pay

## 2019-05-08 ENCOUNTER — Ambulatory Visit: Payer: Self-pay

## 2019-05-08 ENCOUNTER — Ambulatory Visit: Payer: Self-pay | Admitting: Physical Therapy

## 2019-05-08 DIAGNOSIS — R2681 Unsteadiness on feet: Secondary | ICD-10-CM

## 2019-05-08 DIAGNOSIS — R4701 Aphasia: Secondary | ICD-10-CM

## 2019-05-08 DIAGNOSIS — R2689 Other abnormalities of gait and mobility: Secondary | ICD-10-CM

## 2019-05-08 DIAGNOSIS — R41841 Cognitive communication deficit: Secondary | ICD-10-CM

## 2019-05-08 NOTE — Therapy (Signed)
[facility] [address] [place], [place], [address] Phone: [phone]   Fax:  [phone]  Speech Language Pathology Treatment  Patient Details  Name: Cody Porter MRN: [id number] Date of Birth: 10-Nov-1961 Referring Provider (SLP): Alysia Penna, MD   Encounter Date: 05/08/2019  End of Session - 05/08/19 1042    Visit Number  5    Number of Visits  17    Date for SLP Re-Evaluation  07/02/19    SLP Start Time  7    SLP Stop Time   6962    SLP Time Calculation (min)  35 min    Activity Tolerance  Patient tolerated treatment well       Past Medical History:  Diagnosis Date  . ETOH abuse 01/28/2019  . Leg DVT (deep venous thromboembolism), acute, bilateral ([place]) 01/28/2019  . Pulmonary embolus ([place]) 01/28/2019    Past Surgical History:  Procedure Laterality Date  . HERNIA REPAIR    . IR IVC FILTER PLMT / S&I /IMG GUID/MOD SED  02/25/2019  . TONSILLECTOMY      There were no vitals filed for this visit.  Subjective Assessment - 05/08/19 1007    Subjective  Pt reports his expressive language skills as being at baseline.   Currently in Pain?  No/denies            ADULT SLP TREATMENT - 05/08/19 1009      Treatment Provided   Treatment provided  Cognitive-Linquistic      Cognitive-Linquistic Treatment   Treatment focused on  Aphasia    Skilled Treatment  SLP and pt discussed pt's last vacation (mod complex) and pt demonstrated functional language/WNL language production. Pt told SLP he feels his language is at baseline. SLP administered pt [employer] and pt scored 48/60. which met pt's LTG. SLP to likely d/c pt next visit - will verify pt has consistency with expressive language.       Assessment / Recommendations / Plan   Plan  Continue with current plan of care;Other (Comment)   likely d/c next visit     Progression Toward Goals   Progression toward goals  Progressing toward goals          SLP Short Term Goals - 05/08/19 1028      SLP SHORT TERM GOAL #1   Title  pt will provide functional verbal desription of two compensations for anomia over two sessions    Status  Deferred   pt reports back to baseline     SLP SHORT TERM GOAL #2   Title  pt will complete simple to mod complex naming tasks with 90% success and modified independence    Status  Achieved       SLP Long Term Goals - 05/08/19 1029      SLP LONG TERM GOAL #1   Title  pt score on [place] will improve to >46/60    Status  Achieved      SLP LONG TERM GOAL #2   Title  pt will engage functionally in mod complex conversation with modified independence (compensation use) x 3 sessions    Baseline  05-08-19    Time  6    Period  Weeks    Status  On-going       Plan - 05/08/19 1042    Clinical Impression Statement  Pt  present today with WNL/WFL production in mod complex conversation, with noted cognitive communication deficits at baseline. Mother  did not attend therapy with pt. Pt believes he is at baseline with language function. D/C likely next session. See "skilled intervention" for details. Pt would benefit from skilled ST addressing expressive verbal language skills as well as compensations in conversation    Speech Therapy Frequency  2x / week    Duration  --   8 weeks (or 17 sessions), however frequenc/duration to be at x1/week for 8 weeks due to pt preference. After Medicaid approved pt stated he may desire incr to x2/week.   Treatment/Interventions  Patient/family education;SLP instruction and feedback;Internal/external aids;Compensatory techniques;Language facilitation;Cueing hierarchy;Functional tasks    Potential to Achieve Goals  Fair    Potential Considerations  Previous level of function    Consulted and Agree with Plan of Care  Patient       Patient will benefit from skilled therapeutic intervention in order to improve the following deficits and impairments:   1.  Aphasia   2. Cognitive communication deficit       Problem List Patient Active Problem List   Diagnosis Date Noted  . Cerebral edema ([place]) 02/28/2019  . Atrial fibrillation ([place]) 02/28/2019  . Cigarette smoker 02/28/2019  . Family history of stroke 02/28/2019  . ICH (intracerebral hemorrhage) ([place]) B cerebellar and L external capsule hmg while on Eliquis 02/23/2019  . ETOH abuse 01/28/2019  . Pulmonary embolus ([place]) 01/28/2019  . Leg DVT (deep venous thromboembolism), acute, bilateral ([place]) 01/28/2019  . Acute pulmonary embolism ([place]) 01/28/2019    [facility] ,[place], [place]  05/08/2019, 10:44 AM  [facility] [address] [place], [place], [address] Phone: [phone]   Fax:  [phone]   Name: Cody Porter MRN: [id number] Date of Birth: 09-Dec-1960

## 2019-05-09 ENCOUNTER — Encounter: Payer: Self-pay | Admitting: Family Medicine

## 2019-05-09 ENCOUNTER — Other Ambulatory Visit: Payer: Self-pay

## 2019-05-09 ENCOUNTER — Ambulatory Visit: Payer: Self-pay | Attending: Family Medicine | Admitting: Family Medicine

## 2019-05-09 DIAGNOSIS — R269 Unspecified abnormalities of gait and mobility: Secondary | ICD-10-CM

## 2019-05-09 DIAGNOSIS — I69398 Other sequelae of cerebral infarction: Secondary | ICD-10-CM

## 2019-05-09 DIAGNOSIS — Z86718 Personal history of other venous thrombosis and embolism: Secondary | ICD-10-CM

## 2019-05-09 DIAGNOSIS — Z7982 Long term (current) use of aspirin: Secondary | ICD-10-CM

## 2019-05-09 DIAGNOSIS — Z86711 Personal history of pulmonary embolism: Secondary | ICD-10-CM

## 2019-05-09 DIAGNOSIS — I69320 Aphasia following cerebral infarction: Secondary | ICD-10-CM

## 2019-05-09 DIAGNOSIS — I614 Nontraumatic intracerebral hemorrhage in cerebellum: Secondary | ICD-10-CM

## 2019-05-09 DIAGNOSIS — I4891 Unspecified atrial fibrillation: Secondary | ICD-10-CM

## 2019-05-09 DIAGNOSIS — I10 Essential (primary) hypertension: Secondary | ICD-10-CM

## 2019-05-09 MED ORDER — AMLODIPINE BESYLATE 10 MG PO TABS: 10.0000 mg | ORAL_TABLET | Freq: Every day | ORAL | 4 refills | Status: DC

## 2019-05-09 MED ORDER — PANTOPRAZOLE SODIUM 40 MG PO TBEC: 40.0000 mg | DELAYED_RELEASE_TABLET | Freq: Every day | ORAL | 11 refills | Status: DC

## 2019-05-09 MED ORDER — ASPIRIN 81 MG PO TBEC: 81.0000 mg | DELAYED_RELEASE_TABLET | Freq: Every day | ORAL | 11 refills | Status: DC

## 2019-05-09 MED ORDER — METOPROLOL TARTRATE 25 MG PO TABS: 25.0000 mg | ORAL_TABLET | Freq: Two times a day (BID) | ORAL | 4 refills | Status: DC

## 2019-05-09 MED FILL — METOPROLOL TARTRATE 25 MG T: 25 | 30 days supply | Qty: 60 | Fill #0

## 2019-05-09 MED FILL — PANTOPRAZOLE SOD DR 40 MG T: 40 | 30 days supply | Qty: 30 | Fill #0

## 2019-05-09 MED FILL — AMLODIPINE BESYLATE 10 MG T: 10 | 30 days supply | Qty: 30 | Fill #0

## 2019-05-09 NOTE — Progress Notes (Signed)
Patient is needing refills for both of his blood pressure medications.

## 2019-05-09 NOTE — Progress Notes (Signed)
New Patient telephone encounter to establish care    Subjective:  Patient ID: Cody Porter, male    DOB: [birth year]  Age: 58 y.o. MRN: [id number]  Patient location: Home Provider location: Office Others participating in call: Call was initiated by Emilio Aspen, RMA and patient's mother also participated in call as patient asked his mother to speak with me regarding instructions for medication pickup  I connected with Lenise Arena on 05/09/2019 at 11:15 AM by telephone and verified that I was speaking with the correct person using 2 identifiers.  Limitations, risk, security and privacy concerns of performing an evaluation and management medical service by telephone and the availability of in person appointments was discussed with the patient.  Patient was also made aware that there will be a patient responsible chart related to the service.  Patient expressed understanding and agreed to proceed.  CC:  Chief Complaint  Patient presents with  . 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  Patient's Eliquis was discontinued and he was placed on 81 mg aspirin for anticoagulation due to paroxysmal atrial fibrillation.  He also has a history of hypertension.  Patient also with history of alcohol and tobacco use.  Patient reports that he is still attending physical therapy as he still has issues with walking due to lack of balance.  He reports that he does not use a cane or walker but he usually stays near a wall or furniture that he can lean against or hold onto if needed if he loses his balance.  When possible, he will keep his hand against the wall when walking to  help with balance.  Patient denies any headaches or dizziness related to his blood pressure.  He has had no issues with chest pain or sensation of palpitations.  He denies any unusual bruising or bleeding other than recently cutting himself when shaving and states that this did take a little while for the bleeding to stop.  He does feel better than prior to hospitalization.  He denies any current issues with acute cough or shortness of breath.  No fever or chills.  Patient reports that he is almost out of his medications and needs to have all of his medications refilled.  Past Medical History:  Diagnosis Date  . ETOH abuse 01/28/2019  . Leg DVT (deep venous thromboembolism), acute, bilateral ([place]) 01/28/2019  . Pulmonary embolus ([place]) 01/28/2019    Past Surgical History:  Procedure Laterality Date  . HERNIA REPAIR    . IR IVC FILTER PLMT / S&I /IMG GUID/MOD SED  02/25/2019  . TONSILLECTOMY      Family History  Problem Relation Age of Onset  . COPD Mother   . Cancer Mother        Breast x2  . Anuerysm Father    Social History   Tobacco Use  . Smoking status: Current Every Day Smoker    Packs/day: 0.75    Types: Cigarettes  . Smokeless tobacco: Never Used  Substance Use Topics  . Alcohol use: Not Currently    Alcohol/week: 14.0 standard drinks    Types: 14 Cans of beer per week    Comment: Patient states no alcohol x 1 month  .  Drug use: Not Currently    Types: Marijuana  Patient is currently unemployed secondary to his recent CVA and other health issues and patient's 59 year old mother is financially supporting the patient at this time  ROS Review of Systems  Constitutional: Positive for fatigue. Negative for chills and fever.  HENT: Negative for sore throat and trouble swallowing.   Respiratory: Negative for cough and shortness of breath.   Cardiovascular: Negative for chest pain, palpitations and leg swelling.  Gastrointestinal: Negative for abdominal pain, blood in stool,  constipation, diarrhea and nausea.  Endocrine: Negative for polydipsia, polyphagia and polyuria.  Genitourinary: Negative for dysuria and frequency.  Musculoskeletal: Positive for gait problem. Negative for arthralgias.  Neurological: Negative for dizziness and headaches.       Balance issues  Hematological: Negative for adenopathy. Bruises/bleeds easily.    Objective:   No physical examination or vital signs obtained as visit was conducted via telephone   Assessment & Plan:  1. Atrial fibrillation, unspecified type [facility](HCC) Patient with paroxysmal atrial fibrillation which increases patient's risk of embolic CVA.  Continue metoprolol for rate control and aspirin for antiplatelet/antithrombotic effect. - aspirin 81 MG EC tablet; Take 1 tablet (81 mg total) by mouth daily.  Dispense: 30 tablet; Refill: 11 - metoprolol tartrate (LOPRESSOR) 25 MG tablet; Take 1 tablet (25 mg total) by mouth 2 (two) times daily. For blood pressure/heart rate  Dispense: 60 tablet; Refill: 4  2. Nontraumatic intracerebral hemorrhage of cerebellum, unspecified laterality (HCC) Patient is currently unable to work after intracerebral hemorrhage while on Eliquis for treatment of DVT and pulmonary embolism.  Patient will be referred to social work as patient is currently financially dependent on his 59 year old mother.  Patient is to continue physical and speech therapy due to deficits in cognitive communication as well as difficulty with gait and balance status post CVA.  Urged compliance with current medications to control blood pressure, heart rate and prevent embolic stroke. - Ambulatory referral to Social Work  3. Long-term use of aspirin therapy Patient with paroxysmal atrial fibrillation and can no longer take stronger anticoagulants as he had intracerebral hemorrhage while on Eliquis however he does need antiplatelet therapy to prevent embolic CVA due to his atrial fibrillation.  Continue pantoprazole for stomach  protection and aspirin refilled - pantoprazole (PROTONIX) 40 MG tablet; Take 1 tablet (40 mg total) by mouth daily.  Dispense: 30 tablet; Refill: 11  4. Essential hypertension Continue use of amlodipine to control blood pressure secondary to history of intracerebral hemorrhage - amLODipine (NORVASC) 10 MG tablet; Take 1 tablet (10 mg total) by mouth daily. To control blood pressure  Dispense: 30 tablet; Refill: 4  5. Abnormality of gait following cerebrovascular accident (CVA); 6.  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  Patient is at increased fall risk secondary to his gait abnormality.  Fall precautions discussed. - Ambulatory referral to Social Work  7. History of DVT (deep vein thrombosis 8. History of pulmonary embolism Patient is status post hospitalization 01/28/2019 through 01/31/2019 due to acute pulmonary embolism with acute respiratory failure and bilateral lower extremity DVT.  Patient was placed on Eliquis then subsequently sustained intracerebral hemorrhage for which he required readmission April 11/02/2019 through 03/01/2019.  IVC filter placed to prevent future PE during admission in April 2020.   Outpatient Encounter Medications as of 05/09/2019  Medication  Sig  . amLODipine (NORVASC) 10 MG tablet Take 1 tablet (10 mg total) by mouth daily.  Marland Kitchen. aspirin EC 81 MG EC tablet Take 1 tablet (81 mg total) by mouth daily.  . metoprolol tartrate (LOPRESSOR) 25 MG tablet Take 1 tablet (25 mg total) by mouth 2 (two) times daily for 30 days.  Marland Kitchen. acetaminophen (TYLENOL) 325 MG tablet Take 2 tablets (650 mg total) by mouth every 4 (four) hours as needed for mild pain (or temp > 37.5 C (99.5 F)). (Patient not taking: Reported on 05/09/2019)  . pantoprazole (PROTONIX) 40 MG tablet Take 1 tablet (40 mg total)  by mouth daily. (Patient not taking: Reported on 05/09/2019)   No facility-administered encounter medications on file as of 05/09/2019.    12 minutes of non-face-to-face encounter time spent with the patient at today's visit  Follow-up: Return in about 4 weeks (around 06/06/2019) for HTN/Afib and s/p CVA.  Cain Saupeammie Erastus Bartolomei, MD

## 2019-05-09 NOTE — Therapy (Signed)
[facility] [address] [place], [place], [address] Phone: [phone]   Fax:  [phone]  Physical Therapy Treatment  Patient Details  Name: Cody Porter MRN: [id number] Date of Birth: May 08, 1961 Referring Provider (PT): Erick ColaceAndrew E. Kirsteins, MD  CLINIC OPERATION CHANGES: Outpatient Neuro Rehab is open at lower capacity following universal masking, social distancing, and patient screening.  The patient's COVID risk of complications score is 2.  Encounter Date: 05/08/2019  PT End of Session - 05/09/19 1609    Visit Number  5    Number of Visits  9    Date for PT Re-Evaluation  06/08/19    Authorization Type  Medicaid Pending    PT Start Time  1106    PT Stop Time  1150    PT Time Calculation (min)  44 min    Activity Tolerance  Patient tolerated treatment well    Behavior During Therapy  WFL for tasks assessed/performed       Past Medical History:  Diagnosis Date  . ETOH abuse 01/28/2019  . Leg DVT (deep venous thromboembolism), acute, bilateral (HCC) 01/28/2019  . Pulmonary embolus (HCC) 01/28/2019    Past Surgical History:  Procedure Laterality Date  . HERNIA REPAIR    . IR IVC FILTER PLMT / S&I /IMG GUID/MOD SED  02/25/2019  . TONSILLECTOMY      There were no vitals filed for this visit.  Subjective Assessment - 05/09/19 1601    Subjective  Pt states his back pain is better than it was last week but is not completely gone yet - pt has brought his HEP with him to PT session today    Patient is accompained by:  Family member    Pertinent History  ETOH abuse, tobacco use, bilat LE DVT, PE with IVC filter    Limitations  Standing;Walking    Patient Stated Goals  To get rid of the dizziness.     Currently in Pain?  Yes    Pain Score  7     Pain Location  Back    Pain Orientation  Left    Pain Descriptors / Indicators  Discomfort;Guarding;Aching    Pain Type  Acute pain    Pain Onset  1 to 4 weeks ago     Pain Frequency  Intermittent                       [facility] Adult PT Treatment/Exercise - 05/09/19 0001      Transfers   Transfers  Sit to Stand    Number of Reps  10 reps    Comments  no UE support used      Ambulation/Gait   Ambulation/Gait  Yes    Ambulation/Gait Assistance  6: Modified independent (Device/Increase time)    Ambulation Distance (Feet)  115 Feet    Assistive device  None    Gait Pattern  Step-through pattern;Decreased step length - right;Decreased step length - left;Decreased stride length    Ambulation Surface  Level;Indoor      Knee/Hip Exercises: Standing   Heel Raises  Both;1 set;10 reps          Balance Exercises - 05/09/19 1604      Balance Exercises: Standing   Standing Eyes Opened  Wide (BOA);Head turns;Foam/compliant surface;5 reps    Standing Eyes Closed  Wide (BOA);Head turns;Foam/compliant surface;5 reps    Tandem Stance  Eyes open;Intermittent upper extremity support;2 reps;30 secs  SLS  Eyes open;2 reps;10 secs   each leg   Rockerboard  Anterior/posterior;EO;10 reps;UE support    Step Ups  Forward;6 inch;UE support 1   5 reps each leg   Step Over Hurdles / Cones  stepping over and back of balance beam inside // bars 10 reps each leg with minimal UE suport     Other Standing Exercises  Pt performed amb. tossing ball up and catching it approx. 40' x 2 reps with CGA:  amb. with horizontal and verticla head turns 40' x 1 reps each direction         PT Education - 05/09/19 1607    Education Details  pt was given copy of balance exercises from [facility] - issued by primary PT    Person(s) Educated  Patient    Methods  Explanation;Handout;Demonstration    Comprehension  Verbalized understanding;Returned demonstration          PT Long Term Goals - 05/09/19 1612      PT LONG TERM GOAL #1   Title  Pt will demonstrate independence with balance and vestibular HEP    Baseline  dependent    Time  8    Period  Weeks     Status  New      PT LONG TERM GOAL #2   Title  Pt will demonstrate improved bilat LE strength as indicated by improvement in Five time sit to stand to </= 15 seconds    Baseline  21.44 seconds without use of UE    Time  8    Period  Weeks    Status  New      PT LONG TERM GOAL #3   Title  Pt will demonstrate decreased falls risk as indicated by improvement in BERG to >/= 53/56    Baseline  49/56    Time  8    Period  Weeks    Status  New      PT LONG TERM GOAL #4   Title  Pt will demonstrate decreased falls risk during ambulation as indicated by improvement in FGA to >/= 23/30    Baseline  19/30    Time  8    Period  Weeks    Status  New      PT LONG TERM GOAL #5   Title  Pt will perform ambulation outdoors x 1000' over uneven paved and grassy surfaces, up/down curb and reaching down to ground to pick up items (without dizziness) independently to indicate safe return to work.    Baseline  115' indoors with supervision; dizziness with reaching down to floor    Time  8    Period  Weeks    Status  New            Plan - 05/09/19 1609    Clinical Impression Statement  Pt reported dizziness intensity 8/10 with activites but no significant LOB and no other symptoms (nausea, etc.) reported - pt did not require frequent rest breaks. Pt is progressing well towards goals - he states he is planning on finishing PT next session (on 05-13-19).    Personal Factors and Comorbidities  Comorbidity 3+;Finances;Profession    Comorbidities  ETOH abuse, tobacco use, bilat LE DVT, PE with IVC filter    Examination-Activity Limitations  Bend;Locomotion Level;Stand    Examination-Participation Restrictions  Community Activity;Yard Work    Stability/Clinical Decision Making  Stable/Uncomplicated    Rehab Potential  Good    PT Frequency  1x /  week    PT Duration  8 weeks    PT Treatment/Interventions  ADLs/Self Care Home Management;Gait training;Stair training;Functional mobility  training;Therapeutic activities;Therapeutic exercise;Balance training;Neuromuscular re-education;Patient/family education;Vestibular    PT Next Visit Plan  cont balance and strengthening exercises - D/C next session per pt's request?    Consulted and Agree with Plan of Care  Patient       Patient will benefit from skilled therapeutic intervention in order to improve the following deficits and impairments:  Abnormal gait, Decreased balance, Decreased coordination, Decreased strength, Difficulty walking, Dizziness  Visit Diagnosis: 1. Other abnormalities of gait and mobility   2. Unsteadiness on feet        Problem List Patient Active Problem List   Diagnosis Date Noted  . Cerebral edema (HCC) 02/28/2019  . Atrial fibrillation (HCC) 02/28/2019  . Cigarette smoker 02/28/2019  . Family history of stroke 02/28/2019  . 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 ETOH abuse 01/28/2019  . Pulmonary embolus (HCC) 01/28/2019  . Leg DVT (deep venous thromboembolism), acute, bilateral (HCC) 01/28/2019  . Acute pulmonary embolism (HCC) 01/28/2019    Malikiah Debarr, Donavan BurnetLinda Suzanne, PT 05/09/2019, 4:14 PM  [facility] [address] [place], [place], [address] Phone: [phone]   Fax:  [phone]  Name: Cody Porter MRN: [id number] Date of Birth: Aug 18, 1961

## 2019-05-13 ENCOUNTER — Other Ambulatory Visit: Payer: Self-pay

## 2019-05-13 ENCOUNTER — Ambulatory Visit: Payer: Self-pay

## 2019-05-13 ENCOUNTER — Ambulatory Visit: Payer: Self-pay | Admitting: Physical Therapy

## 2019-05-13 DIAGNOSIS — R4701 Aphasia: Secondary | ICD-10-CM

## 2019-05-13 DIAGNOSIS — R2681 Unsteadiness on feet: Secondary | ICD-10-CM

## 2019-05-13 DIAGNOSIS — R41841 Cognitive communication deficit: Secondary | ICD-10-CM

## 2019-05-13 DIAGNOSIS — M6281 Muscle weakness (generalized): Secondary | ICD-10-CM

## 2019-05-13 DIAGNOSIS — R2689 Other abnormalities of gait and mobility: Secondary | ICD-10-CM

## 2019-05-13 NOTE — Therapy (Addendum)
[facility] [address] [place], [place], [address] Phone: [phone]   Fax:  [phone]  Physical Therapy Treatment  Patient Details  Name: Cody Porter MRN: [id number] Date of Birth: 05/09/1961 Referring Provider (PT): Charlett Blake, MD  CLINIC OPERATION CHANGES: Outpatient Neuro Rehab is open at lower capacity following universal masking, social distancing, and patient screening.  The patient's COVID risk of complications score is 2.  Encounter Date: 05/13/2019  PT End of Session - 05/13/19 2112    Visit Number  6    Number of Visits  9    Date for PT Re-Evaluation  06/08/19    Authorization Type  Medicaid Pending    PT Start Time  1001    PT Stop Time  1048    PT Time Calculation (min)  47 min    Activity Tolerance  Patient tolerated treatment well    Behavior During Therapy  WFL for tasks assessed/performed       Past Medical History:  Diagnosis Date  . ETOH abuse 01/28/2019  . Leg DVT (deep venous thromboembolism), acute, bilateral ([place]) 01/28/2019  . Pulmonary embolus ([place]) 01/28/2019    Past Surgical History:  Procedure Laterality Date  . HERNIA REPAIR    . IR IVC FILTER PLMT / S&I /IMG GUID/MOD SED  02/25/2019  . TONSILLECTOMY      There were no vitals filed for this visit.                         Balance Exercises - 05/14/19 2024      Balance Exercises: Standing   Standing Eyes Opened  Wide (BOA);Head turns;Foam/compliant surface;5 reps    Standing Eyes Closed  Wide (BOA);Head turns;Foam/compliant surface;5 reps             PT Long Term Goals - 05/13/19 1003      PT LONG TERM GOAL #1   Title  Pt will demonstrate independence with balance and vestibular HEP    Baseline  dependent    Time  8    Period  Weeks    Status  Achieved      PT LONG TERM GOAL #2   Title  Pt will demonstrate improved bilat LE strength as indicated by improvement in Five time  sit to stand to </= 15 seconds    Baseline  21.44 seconds without use of UE;  15.22 secs;;13.41 secs without UE support    Time  8    Period  Weeks    Status  Achieved      PT LONG TERM GOAL #3   Title  Pt will demonstrate decreased falls risk as indicated by improvement in BERG to >/= 53/56    Baseline  49/56;  score 55/56 on 05-13-19    Time  8    Period  Weeks    Status  Achieved      PT LONG TERM GOAL #4   Title  Pt will demonstrate decreased falls risk during ambulation as indicated by improvement in FGA to >/= 23/30    Baseline  19/30;  28/30 on 05-13-19    Time  8    Period  Weeks    Status  Achieved      PT LONG TERM GOAL #5   Title  Pt will perform ambulation outdoors x 1000' over uneven paved and grassy surfaces, up/down curb and reaching down to ground to pick up items (without  dizziness) independently to indicate safe return to work.    Baseline  115' indoors with supervision; dizziness with reaching down to floor ; pt continues to c/o mild dizziness with reaching down to pick up object off floor    Time  8    Period  Weeks    Status  Partially Met              Patient will benefit from skilled therapeutic intervention in order to improve the following deficits and impairments:     Visit Diagnosis: 1. Muscle weakness (generalized)   2. Other abnormalities of gait and mobility   3. Unsteadiness on feet        Problem List Patient Active Problem List   Diagnosis Date Noted  . Cerebral edema ([place]) 02/28/2019  . Atrial fibrillation ([place]) 02/28/2019  . Cigarette smoker 02/28/2019  . Family history of stroke 02/28/2019  . ICH (intracerebral hemorrhage) ([place]) B cerebellar and L external capsule hmg while on Eliquis 02/23/2019  . ETOH abuse 01/28/2019  . Pulmonary embolus ([place]) 01/28/2019  . Leg DVT (deep venous thromboembolism), acute, bilateral ([place]) 01/28/2019  . Acute pulmonary embolism ([place]) 01/28/2019    PHYSICAL THERAPY DISCHARGE SUMMARY  Visits  from Start of Care: 6  Current functional level related to goals / functional outcomes: Pt has met 4/5 LTG's and partially met LTG #5 due to pt continues to c/o mild dizziness with reaching down to floor and returning to upright position.   Remaining deficits: Cont. C/o mild dizziness and decreased high level balance sklls   Education / Equipment: Pt has been instructed in HEP for balance and vestibular exercises. Plan: Patient agrees to discharge.  Patient goals were met. Patient is being discharged due to the patient's request.  ?????        Pt states he is pleased with progress achieved and requests D/C from PT at this time (he states he is being D/C'd from [facility] and requests to be D/C'd from PT at this time also).   Alda Lea, PT 05/14/2019, 8:27 PM  [facility] [address] [place], [place], [address] Phone: [phone]   Fax:  [phone]  Name: Cody Porter MRN: [id number] Date of Birth: [birth year]

## 2019-05-13 NOTE — Patient Instructions (Signed)
Feet Apart (Compliant Surface) Head Motion - Eyes Open AND EYES CLOSED     With eyes open, standing on compliant surface: _PILLOWS_______, feet shoulder width apart, move head slowly: up and down. Repeat __1-2__ times per session. Do _1___ sessions per day.   THEN DO STANDING ON PILLOW WITH EYES CLOSED - HOLD FOR 10 SECS; THEN ADD HEAD TURNS SIDE TO SIDE AND UP/DOWN  10 REPS EACH DIRECTION

## 2019-05-13 NOTE — Therapy (Signed)
[facility] [address] [place], [place], [address] Phone: [phone]   Fax:  [phone]  Speech Language Pathology Treatment/Discharge summary  Patient Details  Name: Cody Porter MRN: [id number] Date of Birth: 1961-05-31 Referring Provider (SLP): Alysia Penna, MD   Encounter Date: 05/13/2019  End of Session - 05/13/19 1336    Visit Number  6    Number of Visits  17    Date for SLP Re-Evaluation  07/02/19    SLP Start Time  30    SLP Stop Time   1130    SLP Time Calculation (min)  28 min    Activity Tolerance  Patient tolerated treatment well       Past Medical History:  Diagnosis Date  . ETOH abuse 01/28/2019  . Leg DVT (deep venous thromboembolism), acute, bilateral ([place]) 01/28/2019  . Pulmonary embolus ([place]) 01/28/2019    Past Surgical History:  Procedure Laterality Date  . HERNIA REPAIR    . IR IVC FILTER PLMT / S&I /IMG GUID/MOD SED  02/25/2019  . TONSILLECTOMY      There were no vitals filed for this visit.  Subjective Assessment - 05/13/19 1106    Subjective  "Nope, no questions for you." (pt, re: any questions for ST)    Currently in Pain?  Yes    Pain Score  2     Pain Location  Back    Pain Orientation  Left    Pain Descriptors / Indicators  Aching;Discomfort    Pain Type  Acute pain    Pain Onset  1 to 4 weeks ago    Pain Frequency  Intermittent    Aggravating Factors   transferring sit/stand    Pain Relieving Factors  meds            ADULT SLP TREATMENT - 05/13/19 1108      General Information   Behavior/Cognition  Alert;Cooperative;Pleasant mood      Treatment Provided   Treatment provided  Cognitive-Linquistic      Cognitive-Linquistic Treatment   Treatment focused on  Aphasia    Skilled Treatment  SLP asked pt if he had any questions for SLP since this was d/c day; pt had none. SLP and pt conversed both indoors in tx room and walked outdoors for 25 minutes with  conversation varying back and forth between simple and mod complex conversation. Pt without notable anomia during this time. Pt tells SLP his speech/langauge skills are at baseline and that he is ready for d/c.       Assessment / Recommendations / Plan   Plan  Discharge SLP treatment due to (comment)   meeting goals     Progression Toward Goals   Progression toward goals  Goals met, education completed, patient discharged from [facility] - 05/08/19 [place] #1   Title  pt will provide functional verbal desription of two compensations for anomia over two sessions    Status  Deferred   pt reports back to baseline     SLP [place] #2   Title  pt will complete simple to mod complex naming tasks with 90% success and modified independence    Status  Achieved       SLP Long Term Goals - 05/13/19 1335      SLP LONG TERM GOAL #1   Title  pt score  on [place] will improve to >46/60    Status  Achieved      SLP LONG TERM GOAL #2   Title  pt will engage functionally in mod complex conversation with modified independence (compensation use) x 3 sessions    Baseline  05-08-19,05-13-19    Time  --    Period  --    Status  Partially Met       Plan - 05/13/19 1337    Clinical Impression Statement  Pt  present today with WNL production in mod complex conversation, with noted cognitive communication deficits at baseline. Mother did not attend therapy with pt. Pt believes he is at baseline with language function. D/C today, pt is in agreement. See "skilled intervention" for details.    Treatment/Interventions  Patient/family education;SLP instruction and feedback;Internal/external aids;Compensatory techniques;Language facilitation;Cueing hierarchy;Functional tasks    Potential to Achieve Goals  Fair    Potential Considerations  Previous level of function    Consulted and Agree with Plan of Care  Patient       Patient will benefit  from skilled therapeutic intervention in order to improve the following deficits and impairments:   1. Aphasia   2. Cognitive communication deficit      SPEECH THERAPY DISCHARGE SUMMARY  Visits from Start of Care: 6  Current functional level related to goals / functional outcomes: Pt tells SLP his language skills and thinking skills appear at baseline. Pt met both LTGs.   Remaining deficits: Cognitive communication deficits (premorbid).   Education / Equipment: Chartered loss adjuster, compensations for anomia.  Plan: Patient agrees to discharge.  Patient goals were partially met. Patient is being discharged due to being pleased with the current functional level.  ?????and pt reporting skills at baseline.       Problem List Patient Active Problem List   Diagnosis Date Noted  . Cerebral edema ([facility]) 02/28/2019  . Atrial fibrillation ([place]) 02/28/2019  . Cigarette smoker 02/28/2019  . Family history of stroke 02/28/2019  . 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 ETOH abuse 01/28/2019  . Pulmonary embolus ([facility]) 01/28/2019  . Leg DVT (deep venous thromboembolism), acute, bilateral ([facility]) 01/28/2019  . Acute pulmonary embolism ([facility]) 01/28/2019    [facility] ,MS, CCC-SLP  05/13/2019, 1:42 PM  [facility] [address] [place], [place], [address] Phone: [phone]   Fax:  [phone]   Name: Cody Porter MRN: [id number] Date of Birth: 07/12/1961

## 2019-05-14 ENCOUNTER — Telehealth: Payer: Self-pay

## 2019-05-14 ENCOUNTER — Inpatient Hospital Stay: Payer: Self-pay | Admitting: Adult Health

## 2019-05-14 NOTE — Telephone Encounter (Signed)
Patient was a no call/no show for their appointment today.   

## 2019-05-15 ENCOUNTER — Other Ambulatory Visit: Payer: Self-pay | Admitting: Diagnostic Radiology

## 2019-05-15 ENCOUNTER — Ambulatory Visit: Payer: Self-pay | Admitting: Family Medicine

## 2019-05-15 DIAGNOSIS — Z86718 Personal history of other venous thrombosis and embolism: Secondary | ICD-10-CM

## 2019-05-19 ENCOUNTER — Ambulatory Visit: Payer: Self-pay | Admitting: Speech Pathology

## 2019-05-19 ENCOUNTER — Ambulatory Visit: Payer: Self-pay | Admitting: Physical Therapy

## 2019-05-22 ENCOUNTER — Telehealth: Payer: Self-pay | Admitting: Family Medicine

## 2019-05-22 NOTE — Telephone Encounter (Signed)
Patients mother called concerned that her son, he patient had only eaten a donut and a cup of coffee today and had been out in the sun.  Patient became pale and diaphoretic but did not have a syncopal episode.  Mother was very concern as patient had recently had a stroke and she was worried about what to do.  Patient at time of call was doing a puzzle and drinking coffee.  Patient's mother informed about the symptoms of a stroke, to which she denied the patient having.  Triage Nurse had mothere ask patient to repeat simple stroke words to [place] for slurred speech, of which there was none.  Triage Nurse advised mother to do a facial movement exam to check for paralysis which none was seen.  Patients mother informed of stroke symptoms that would raise an alarm to call EMS and go to the hospital.  Patient;s mother told that if symptoms appeared to send son to hospital.    Patient's mother advised to tell the patient that he should eat more and better food and stay out of the sun.  Patients mother acknowledged understanding of advise.

## 2019-05-22 NOTE — Telephone Encounter (Signed)
Patient mother called stating that the patient almost passed out this morning and that they are not sure what to do. Patient mother states that the patient was outside in the sun and only had a cup of coffee and a donught. Please follow up.

## 2019-05-25 ENCOUNTER — Other Ambulatory Visit: Payer: Self-pay

## 2019-05-25 ENCOUNTER — Emergency Department (HOSPITAL_COMMUNITY)
Admission: EM | Admit: 2019-05-25 | Discharge: 2019-05-26 | Disposition: A | Discharge status: Home / Self Care | Payer: Self-pay | Attending: Emergency Medicine | Admitting: Emergency Medicine

## 2019-05-25 ENCOUNTER — Encounter (HOSPITAL_COMMUNITY): Payer: Self-pay | Admitting: *Deleted

## 2019-05-25 DIAGNOSIS — H538 Other visual disturbances: Secondary | ICD-10-CM | POA: Insufficient documentation

## 2019-05-25 DIAGNOSIS — Z7982 Long term (current) use of aspirin: Secondary | ICD-10-CM | POA: Insufficient documentation

## 2019-05-25 DIAGNOSIS — Z79899 Other long term (current) drug therapy: Secondary | ICD-10-CM | POA: Insufficient documentation

## 2019-05-25 DIAGNOSIS — F17210 Nicotine dependence, cigarettes, uncomplicated: Secondary | ICD-10-CM | POA: Insufficient documentation

## 2019-05-25 DIAGNOSIS — R42 Dizziness and giddiness: Secondary | ICD-10-CM | POA: Insufficient documentation

## 2019-05-25 LAB — URINALYSIS, ROUTINE W REFLEX MICROSCOPIC
Bilirubin Urine: NEGATIVE
Glucose, UA: NEGATIVE mg/dL
Hgb urine dipstick: NEGATIVE
Ketones, ur: 5 mg/dL — AB
Leukocytes,Ua: NEGATIVE
Nitrite: NEGATIVE
Protein, ur: NEGATIVE mg/dL
Specific Gravity, Urine: 1.025 (ref 1.005–1.030)
pH: 5 (ref 5.0–8.0)

## 2019-05-25 MED ORDER — SODIUM CHLORIDE 0.9% FLUSH
3.0000 mL | Freq: Once | INTRAVENOUS | Status: AC
  Administered 2019-05-26: 3 mL via INTRAVENOUS

## 2019-05-25 NOTE — ED Triage Notes (Signed)
Pt reports that every time he eats at cookout he has an episode of feeling lightheaded and vision change and "sees spots". Denies syncope, vomiting, or any neuro symptoms. Vision has returned to normal pta.

## 2019-05-26 ENCOUNTER — Other Ambulatory Visit: Payer: Self-pay

## 2019-05-26 ENCOUNTER — Ambulatory Visit: Payer: Self-pay | Admitting: Physical Therapy

## 2019-05-26 LAB — BASIC METABOLIC PANEL
Anion gap: 10 (ref 5–15)
BUN: 15 mg/dL (ref 6–20)
CO2: 24 mmol/L (ref 22–32)
Calcium: 9.5 mg/dL (ref 8.9–10.3)
Chloride: 105 mmol/L (ref 98–111)
Creatinine, Ser: 1.72 mg/dL — ABNORMAL HIGH (ref 0.61–1.24)
GFR calc Af Amer: 49 mL/min — ABNORMAL LOW (ref >60)
GFR calc non Af Amer: 43 mL/min — ABNORMAL LOW (ref >60)
Glucose, Bld: 162 mg/dL — ABNORMAL HIGH (ref 70–99)
Potassium: 3.7 mmol/L (ref 3.5–5.1)
Sodium: 139 mmol/L (ref 135–145)

## 2019-05-26 LAB — CBC
HCT: 43.1 % (ref 39.0–52.0)
Hemoglobin: 14.1 g/dL (ref 13.0–17.0)
MCH: 29.7 pg (ref 26.0–34.0)
MCHC: 32.7 g/dL (ref 30.0–36.0)
MCV: 90.9 fL (ref 80.0–100.0)
Platelets: 379 10*3/uL (ref 150–400)
RBC: 4.74 MIL/uL (ref 4.22–5.81)
RDW: 12.9 % (ref 11.5–15.5)
WBC: 10.8 10*3/uL — ABNORMAL HIGH (ref 4.0–10.5)
nRBC: 0 % (ref 0.0–0.2)

## 2019-05-26 LAB — CBG MONITORING, ED
Glucose-Capillary: 91 mg/dL (ref 70–99)
Glucose-Capillary: 95 mg/dL (ref 70–99)

## 2019-05-26 MED ORDER — SODIUM CHLORIDE 0.9 % IV BOLUS
1000.0000 mL | Freq: Once | INTRAVENOUS | Status: AC
  Administered 2019-05-26: 1000 mL via INTRAVENOUS

## 2019-05-26 NOTE — Discharge Instructions (Signed)
Try to make sure to increase your water intake, limit soda, coffee, and tea. Follow-up with your primary care doctor within the next 2 weeks for re-check of your kidney function. Return here for any new/acute changes.

## 2019-05-26 NOTE — ED Provider Notes (Signed)
[facility] EM[facility]GENCY [facility] Provider Note   CSN: [id number] Arrival date & time: 05/25/19  2243     History   Chief Complaint Chief Complaint  Patient presents with   Dizziness    HPI Cody Porter is a 58 y.o. male.     The history is provided by the patient and medical records.  Dizziness    58 y.o. M with hx of alcohol abuse (sober since March 2020), hx of DVT/PE but not on anticoagulation due to ICH, presenting to the ED for lightheadedness.  Patient describes symptoms as feeling somewhat dizzy and seeing black spots in his vision.  He states this occurs when he eats food from cookout.  States he ate a BLT earlier today and had similar symptoms.  Reports this happened previously when eating cheeseburgers.  He states "maybe is something in the food".  He has not had any chest pain, shortness of breath, or syncopal events.  He does report his urine has been dark yellow lately.  He has not been drinking water, has been drinking a lot of coffee and smoking cigarettes.  Currently, patient is asymptomatic.  Past Medical History:  Diagnosis Date   ETOH abuse 01/28/2019   Leg DVT (deep venous thromboembolism), acute, bilateral ([facility]) 01/28/2019   Pulmonary embolus ([facility]) 01/28/2019    Patient Active Problem List   Diagnosis Date Noted   Cerebral edema ([facility]) 02/28/2019   Atrial fibrillation ([facility]) 02/28/2019   Cigarette smoker 02/28/2019   Family history of stroke 02/28/2019   ICH (intracerebral hemorrhage) ([facility]) B cerebellar and L external capsule hmg while on Eliquis 02/23/2019   ETOH abuse 01/28/2019   Pulmonary embolus ([facility]) 01/28/2019   Leg DVT (deep venous thromboembolism), acute, bilateral ([facility]) 01/28/2019   Acute pulmonary embolism ([facility]) 01/28/2019    Past Surgical History:  Procedure Laterality Date   HERNIA REPAIR     IR IVC FILTER PLMT / S&I /IMG GUID/MOD SED  02/25/2019   TONSILLECTOMY          Home Medications    Prior  to Admission medications   Medication Sig Start Date End Date Taking? Authorizing Provider  acetaminophen (TYLENOL) 325 MG tablet Take 2 tablets (650 mg total) by mouth every 4 (four) hours as needed for mild pain (or temp > 37.5 C (99.5 F)). Patient not taking: Reported on 05/09/2019 03/07/19   Angiulli, Mcarthur Rossettianiel J, PA-C  amLODipine (NORVASC) 10 MG tablet Take 1 tablet (10 mg total) by mouth daily. To control blood pressure 05/09/19   Fulp, Cammie, MD  aspirin 81 MG EC tablet Take 1 tablet (81 mg total) by mouth daily. 05/09/19   Fulp, Cammie, MD  metoprolol tartrate (LOPRESSOR) 25 MG tablet Take 1 tablet (25 mg total) by mouth 2 (two) times daily. For blood pressure/heart rate 05/09/19   Fulp, Cammie, MD  pantoprazole (PROTONIX) 40 MG tablet Take 1 tablet (40 mg total) by mouth daily. 05/09/19   Cain SaupeFulp, Cammie, MD    Family History Family History  Problem Relation Age of Onset   COPD Mother    Cancer Mother        Breast x2   Anuerysm Father     Social History Social History   Tobacco Use   Smoking status: Current Every Day Smoker    Packs/day: 0.75    Types: Cigarettes   Smokeless tobacco: Never Used  Substance Use Topics   Alcohol use: Not Currently    Alcohol/week: 14.0 standard drinks  Types: 14 Cans of beer per week    Comment: Patient states no alcohol x 1 month   Drug use: Not Currently    Types: Marijuana     Allergies   Aspirin   Review of Systems Review of Systems  Neurological: Positive for light-headedness.  All other systems reviewed and are negative.    Physical Exam Updated Vital Signs BP (!) 150/88 (BP Location: Right Arm) Comment: Simultaneous filing. User may not have seen previous data.   Pulse 73 Comment: Simultaneous filing. User may not have seen previous data.   Temp 98.1 F (36.7 C) (Oral)    Resp 16 Comment: Simultaneous filing. User may not have seen previous data.   SpO2 99% Comment: Simultaneous filing. User may not have seen previous  data.  Physical Exam Vitals signs and nursing note reviewed.  Constitutional:      Appearance: He is well-developed.  HENT:     Head: Normocephalic and atraumatic.  Eyes:     Conjunctiva/sclera: Conjunctivae normal.     Pupils: Pupils are equal, round, and reactive to light.     Comments: PERRL  Neck:     Musculoskeletal: Normal range of motion.  Cardiovascular:     Rate and Rhythm: Normal rate and regular rhythm.     Heart sounds: Normal heart sounds.  Pulmonary:     Effort: Pulmonary effort is normal.     Breath sounds: Normal breath sounds.  Abdominal:     General: Bowel sounds are normal.     Palpations: Abdomen is soft.  Musculoskeletal: Normal range of motion.  Skin:    General: Skin is warm and dry.  Neurological:     Mental Status: He is alert and oriented to person, place, and time.     Comments: AAOx3, answering questions and following commands appropriately; equal strength UE and LE bilaterally; CN grossly intact; moves all extremities appropriately without ataxia; no focal neuro deficits or facial asymmetry appreciated      ED Treatments / Results  Labs (all labs ordered are listed, but only abnormal results are displayed) Labs Reviewed  BASIC METABOLIC PANEL - Abnormal; Notable for the following components:      Result Value   Glucose, Bld 162 (*)    Creatinine, Ser 1.72 (*)    GFR calc non Af Amer 43 (*)    GFR calc Af Amer 49 (*)    All other components within normal limits  CBC - Abnormal; Notable for the following components:   WBC 10.8 (*)    All other components within normal limits  URINALYSIS, ROUTINE W REFLEX MICROSCOPIC - Abnormal; Notable for the following components:   Color, Urine AMBER (*)    APPearance HAZY (*)    Ketones, ur 5 (*)    All other components within normal limits  CBG MONITORING, ED  CBG MONITORING, ED    EKG EKG Interpretation  Date/Time:  Sunday May 25 2019 23:04:04 EDT Ventricular Rate:  84 PR  Interval:  162 QRS Duration: 86 QT Interval:  380 QTC Calculation: 449 R Axis:   -15 Text Interpretation:  Normal sinus rhythm septal infarct old Confirmed by Randal Buba, April ([id number]) on 05/26/2019 3:23:53 AM   Radiology No results found.  Procedures Procedures (including critical care time)  Medications Ordered in ED Medications  sodium chloride flush (NS) 0.9 % injection 3 mL (3 mLs Intravenous Given 05/26/19 0508)  sodium chloride 0.9 % bolus 1,000 mL (0 mLs Intravenous Stopped 05/26/19 0508)  Initial Impression / Assessment and Plan / ED Course  I have reviewed the triage vital signs and the nursing notes.  Pertinent labs & imaging results that were available during my care of the patient were reviewed by me and considered in my medical decision making (see chart for details).  58 year old male here with episode of lightheadedness and black spots in his vision after eating cookout.  He reports this is happened before but only seems to happen when eating food from this particular restaurant.  He is currently asymptomatic.  Labs here are overall reassuring, his creatinine is elevated from prior.  He reports he has not been drinking much water but mostly coffee.  Orthostatics here are normal.  He was given a liter of fluids.  He remains asymptomatic.  His symptoms are not concerning for TIA, stroke, or other intracranial process.  I do not feel he needs emergent head CT or further work-up.  Recommended they continue good oral hydration at home, close follow-up with PCP for recheck of renal function within the next 2 weeks.  Return here for any new or acute changes.  Final Clinical Impressions(s) / ED Diagnoses   Final diagnoses:  Lightheadedness    ED Discharge Orders    None       Garlon HatchetSanders, Ulyess Muto M, PA-C 05/26/19 0520    Palumbo, April, MD 05/26/19 [id number]

## 2019-06-02 ENCOUNTER — Telehealth: Payer: Self-pay | Admitting: Licensed Clinical Social Worker

## 2019-06-02 NOTE — Telephone Encounter (Signed)
Call placed to patient regarding consult from PCP to address behavioral health and/or resource needs. Pt's mother, Kerrie Pleasure, answered stating pt was unavailable.   LCSW introduced self and explained role at [facility]. Ms. Kerrie Pleasure shared that she will be present during pt's upcoming appointment with PCP on 06/06/2019. LCSW will attempt to meet with patient during appointment to offer supportive resources. No additional concerns noted.

## 2019-06-03 ENCOUNTER — Ambulatory Visit: Payer: Self-pay | Admitting: Physical Therapy

## 2019-06-06 ENCOUNTER — Ambulatory Visit: Payer: Self-pay | Attending: Family Medicine | Admitting: Family Medicine

## 2019-06-06 ENCOUNTER — Encounter: Payer: Self-pay | Admitting: Family Medicine

## 2019-06-06 ENCOUNTER — Ambulatory Visit: Payer: Self-pay | Attending: Family Medicine | Admitting: Licensed Clinical Social Worker

## 2019-06-06 ENCOUNTER — Other Ambulatory Visit: Payer: Self-pay

## 2019-06-06 VITALS — BP 120/80 | HR 61 | Temp 97.5°F | Ht 68.0 in | Wt 175.4 lb

## 2019-06-06 DIAGNOSIS — I480 Paroxysmal atrial fibrillation: Secondary | ICD-10-CM

## 2019-06-06 DIAGNOSIS — I614 Nontraumatic intracerebral hemorrhage in cerebellum: Secondary | ICD-10-CM

## 2019-06-06 DIAGNOSIS — Z86718 Personal history of other venous thrombosis and embolism: Secondary | ICD-10-CM

## 2019-06-06 DIAGNOSIS — Z09 Encounter for follow-up examination after completed treatment for conditions other than malignant neoplasm: Secondary | ICD-10-CM

## 2019-06-06 DIAGNOSIS — R7303 Prediabetes: Secondary | ICD-10-CM

## 2019-06-06 DIAGNOSIS — Z598 Other problems related to housing and economic circumstances: Secondary | ICD-10-CM

## 2019-06-06 DIAGNOSIS — Z86711 Personal history of pulmonary embolism: Secondary | ICD-10-CM

## 2019-06-06 DIAGNOSIS — Z599 Problem related to housing and economic circumstances, unspecified: Secondary | ICD-10-CM

## 2019-06-06 DIAGNOSIS — R739 Hyperglycemia, unspecified: Secondary | ICD-10-CM

## 2019-06-06 DIAGNOSIS — I10 Essential (primary) hypertension: Secondary | ICD-10-CM

## 2019-06-06 DIAGNOSIS — I69193 Ataxia following nontraumatic intracerebral hemorrhage: Secondary | ICD-10-CM

## 2019-06-06 DIAGNOSIS — N183 Chronic kidney disease, stage 3 unspecified: Secondary | ICD-10-CM

## 2019-06-06 DIAGNOSIS — I69320 Aphasia following cerebral infarction: Secondary | ICD-10-CM

## 2019-06-06 DIAGNOSIS — R7309 Other abnormal glucose: Secondary | ICD-10-CM

## 2019-06-06 LAB — POCT GLYCOSYLATED HEMOGLOBIN (HGB A1C): HbA1c, POC (prediabetic range): 5.7 % (ref 5.7–6.4)

## 2019-06-06 NOTE — Patient Instructions (Signed)
Preventing Type 2 Diabetes Mellitus Type 2 diabetes (type 2 diabetes mellitus) is a long-term (chronic) disease that affects blood sugar (glucose) levels. Normally, a hormone called insulin allows glucose to enter cells in the body. The cells use glucose for energy. In type 2 diabetes, one or both of these problems may be present:  The body does not make enough insulin.  The body does not respond properly to insulin that it makes (insulin resistance). Insulin resistance or lack of insulin causes excess glucose to build up in the blood instead of going into cells. As a result, high blood glucose (hyperglycemia) develops, which can cause many complications. Being overweight or obese and having an inactive (sedentary) lifestyle can increase your risk for diabetes. Type 2 diabetes can be delayed or prevented by making certain nutrition and lifestyle changes. What nutrition changes can be made?   Eat healthy meals and snacks regularly. Keep a healthy snack with you for when you get hungry between meals, such as fruit or a handful of nuts.  Eat lean meats and proteins that are low in saturated fats, such as chicken, fish, egg whites, and beans. Avoid processed meats.  Eat plenty of fruits and vegetables and plenty of grains that have not been processed (whole grains). It is recommended that you eat: ? 1?2 cups of fruit every day. ? 2?3 cups of vegetables every day. ? 6?8 oz of whole grains every day, such as oats, whole wheat, bulgur, brown rice, quinoa, and millet.  Eat low-fat dairy products, such as milk, yogurt, and cheese.  Eat foods that contain healthy fats, such as nuts, avocado, olive oil, and canola oil.  Drink water throughout the day. Avoid drinks that contain added sugar, such as soda or sweet tea.  Follow instructions from your health care provider about specific eating or drinking restrictions.  Control how much food you eat at a time (portion size). ? Check food labels to find  out the serving sizes of foods. ? Use a kitchen scale to weigh amounts of foods.  Saute or steam food instead of frying it. Cook with water or broth instead of oils or butter.  Limit your intake of: ? Salt (sodium). Have no more than 1 tsp (2,400 mg) of sodium a day. If you have heart disease or high blood pressure, have less than ? tsp (1,500 mg) of sodium a day. ? Saturated fat. This is fat that is solid at room temperature, such as butter or fat on meat. What lifestyle changes can be made? Activity   Do moderate-intensity physical activity for at least 30 minutes on at least 5 days of the week, or as much as told by your health care provider.  Ask your health care provider what activities are safe for you. A mix of physical activities may be best, such as walking, swimming, cycling, and strength training.  Try to add physical activity into your day. For example: ? Park in spots that are farther away than usual, so that you walk more. For example, park in a far corner of the parking lot when you go to the office or the grocery store. ? Take a walk during your lunch break. ? Use stairs instead of elevators or escalators. Weight Loss  Lose weight as directed. Your health care provider can determine how much weight loss is best for you and can help you lose weight safely.  If you are overweight or obese, you may be instructed to lose at least 5?7 %   of your body weight. Alcohol and Tobacco   Limit alcohol intake to no more than 1 drink a day for nonpregnant women and 2 drinks a day for men. One drink equals 12 oz of beer, 5 oz of wine, or 1 oz of hard liquor.  Do not use any tobacco products, such as cigarettes, chewing tobacco, and e-cigarettes. If you need help quitting, ask your health care provider. Work With Your Health Care Provider  Have your blood glucose tested regularly, as told by your health care provider.  Discuss your risk factors and how you can reduce your risk for  diabetes.  Get screening tests as told by your health care provider. You may have screening tests regularly, especially if you have certain risk factors for type 2 diabetes.  Make an appointment with a diet and nutrition specialist (registered dietitian). A registered dietitian can help you make a healthy eating plan and can help you understand portion sizes and food labels. Why are these changes important?  It is possible to prevent or delay type 2 diabetes and related health problems by making lifestyle and nutrition changes.  It can be difficult to recognize signs of type 2 diabetes. The best way to avoid possible damage to your body is to take actions to prevent the disease before you develop symptoms. What can happen if changes are not made?  Your blood glucose levels may keep increasing. Having high blood glucose for a long time is dangerous. Too much glucose in your blood can damage your blood vessels, heart, kidneys, nerves, and eyes.  You may develop prediabetes or type 2 diabetes. Type 2 diabetes can lead to many chronic health problems and complications, such as: ? Heart disease. ? Stroke. ? Blindness. ? Kidney disease. ? Depression. ? Poor circulation in the feet and legs, which could lead to surgical removal (amputation) in severe cases. Where to find support  Ask your health care provider to recommend a registered dietitian, diabetes educator, or weight loss program.  Look for local or online weight loss groups.  Join a gym, fitness club, or outdoor activity group, such as a walking club. Where to find more information To learn more about diabetes and diabetes prevention, visit:  American Diabetes Association (ADA): www.diabetes.[employer]  National Institute of Diabetes and Digestive and Kidney Diseases: ToyArticles.cawww.niddk.nih.gov/health-information/diabetes To learn more about healthy eating, visit:  The U.S. Department of Agriculture Architect(USDA), Choose My Plate:  http://yates.biz/www.choosemyplate.gov/food-groups  Office of Disease Prevention and Health Promotion (ODPHP), Dietary Guidelines: ListingMagazine.siwww.health.gov/dietaryguidelines Summary  You can reduce your risk for type 2 diabetes by increasing your physical activity, eating healthy foods, and losing weight as directed.  Talk with your health care provider about your risk for type 2 diabetes. Ask about any blood tests or screening tests that you need to have. This information is not intended to replace advice given to you by your health care provider. Make sure you discuss any questions you have with your health care provider. Document Released: 02/21/2016 Document Revised: 02/21/2019 Document Reviewed: 12/21/2015 Elsevier Patient Education  2020 Elsevier Inc.  Chronic Kidney Disease, Adult Chronic kidney disease (CKD) occurs when the kidneys become damaged slowly over a long period of time. The kidneys are a pair of organs that do many important jobs in the body, including:  Removing waste and extra fluid from the blood to make urine.  Making hormones that maintain the amount of fluid in tissues and blood vessels.  Maintaining the right amount of fluids and chemicals in the  body. A small amount of kidney damage may not cause problems, but a large amount of damage may make it hard or impossible for the kidneys to work the way they should. If steps are not taken to slow down kidney damage or to stop it from getting worse, the kidneys may stop working permanently (end-stage renal disease or ESRD). Most of the time, CKD does not go away, but it can often be controlled. People who have CKD are usually able to live normal lives. What are the causes? The most common causes of this condition are diabetes and high blood pressure (hypertension). Other causes include:  Heart and blood vessel (cardiovascular) disease.  Kidney diseases, such as: ? Glomerulonephritis. ? Interstitial nephritis. ? Polycystic kidney  disease. ? Renal vascular disease.  Diseases that affect the immune system.  Genetic diseases.  Medicines that damage the kidneys, such as anti-inflammatory medicines.  Being around or being in contact with poisonous (toxic) substances.  A kidney or urinary infection that occurs again and again (recurs).  Vasculitis. This is swelling or inflammation of the blood vessels.  A problem with urine flow that may be caused by: ? Cancer. ? Having kidney stones more than one time. ? An enlarged prostate, in males. What increases the risk? You are more likely to develop this condition if you:  Are older than age 59.  Are male.  Are African-American, Hispanic, Asian, [place], or American [place].  Are a current or former smoker.  Are obese.  Have a family history of kidney disease or failure.  Often take medicines that are damaging to the kidneys. What are the signs or symptoms? Symptoms of this condition include:  Swelling (edema) of the face, legs, ankles, or feet.  Tiredness (lethargy) and having less energy.  Nausea or vomiting.  Confusion or trouble concentrating.  Problems with urination, such as: ? Painful or burning feeling during urination. ? Decreased urine production. ? Frequent urination, especially at night. ? Bloody urine.  Muscle twitches and cramps, especially in the legs.  Shortness of breath.  Weakness.  Loss of appetite.  Metallic taste in the mouth.  Trouble sleeping.  Dry, itchy skin.  A low blood count (anemia).  Pale lining of the eyelids and surface of the eye (conjunctiva). Symptoms develop slowly and may not be obvious until the kidney damage becomes severe. It is possible to have kidney disease for years without having any symptoms. How is this diagnosed? This condition may be diagnosed based on:  Blood tests.  Urine tests.  Imaging tests, such as an ultrasound or CT scan.  A test in which a sample of tissue is  removed from the kidneys to be examined under a microscope (kidney biopsy). These test results will help your health care provider determine how serious the CKD is. How is this treated? There is no cure for most cases of this condition, but treatment usually relieves symptoms and prevents or slows the progression of the disease. Treatment may include:  Making diet changes, which may require you to avoid alcohol, salty foods (sodium), and foods that are high in potassium, calcium, and protein.  Medicines: ? To lower blood pressure. ? To control blood glucose. ? To relieve anemia. ? To relieve swelling. ? To protect your bones. ? To improve the balance of electrolytes in your blood.  Removing toxic waste from the body through types of dialysis, if the kidneys can no longer do their job (kidney failure).  Managing any other conditions that  are causing your CKD or making it worse. Follow these instructions at home: Medicines  Take over-the-counter and prescription medicines only as told by your health care provider. The dose of some medicines that you take may need to be adjusted.  Do not take any new medicines unless approved by your health care provider. Many medicines can worsen your kidney damage.  Do not take any vitamin and mineral supplements unless approved by your health care provider. Many nutritional supplements can worsen your kidney damage. General instructions  Follow your prescribed diet as told by your health care provider.  Do not use any products that contain nicotine or tobacco, such as cigarettes and e-cigarettes. If you need help quitting, ask your health care provider.  Monitor and track your blood pressure at home. Report changes in your blood pressure as told by your health care provider.  If you are being treated for diabetes, monitor and track your blood sugar (blood glucose) levels as told by your health care provider.  Maintain a healthy weight. If you need  help with this, ask your health care provider.  Start or continue an exercise plan. Exercise at least 30 minutes a day, 5 days a week.  Keep your immunizations up to date as told by your health care provider.  Keep all follow-up visits as told by your health care provider. This is important. Where to find more information  American Association of Kidney Patients: ResidentialShow.iswww.aakp.org  [employer]ney Foundation: www.kidney.org  American Kidney Fund: FightingMatch.com.eewww.akfinc.org  [facility]: www.lifeoptions.org and www.kidneyschool.org Contact a health care provider if:  Your symptoms get worse.  You develop new symptoms. Get help right away if:  You develop symptoms of ESRD, which include: ? Headaches. ? Numbness in the hands or feet. ? Easy bruising. ? Frequent hiccups. ? Chest pain. ? Shortness of breath. ? Lack of menstruation, in women.  You have a fever.  You have decreased urine production.  You have pain or bleeding when you urinate. Summary  Chronic kidney disease (CKD) occurs when the kidneys become damaged slowly over a long period of time.  The most common causes of this condition are diabetes and high blood pressure (hypertension).  There is no cure for most cases of this condition, but treatment usually relieves symptoms and prevents or slows the progression of the disease. Treatment may include a combination of medicines and lifestyle changes. This information is not intended to replace advice given to you by your health care provider. Make sure you discuss any questions you have with your health care provider. Document Released: 08/08/2008 Document Revised: 10/12/2017 Document Reviewed: 12/07/2016 Elsevier Patient Education  2020 [employer]

## 2019-06-06 NOTE — Progress Notes (Signed)
Established Patient Office Visit  Subjective:  Patient ID: Cody Porter, male    DOB: 1961-08-18  Age: 58 y.o. MRN: [id number]  CC:  Chief Complaint  Patient presents with  . New Patient (Initial Visit)  Patient established care on 05/09/2019 by tele-health visit  HPI Cody Porter presents for in-person visit after establishing initial visit by phone. Patient with history of PE/DVT for which he was placed on Eliquis and then suffered an intracranial hemorrhage. Patient is now on aspirin therapy for anticoagulation/paroxysmal atrial fibrillation and has IVC filter. Patient has a history of etoh dependence but stopped drinking in March. Per his mother, he now only drinks coffee with a lot of sugar multiple times per day.  He is s/p ED visit on 05/25/2019 due to dizziness/lightheadedness.  Patient had been walking outside and then ate fast food and began to not feel well.  Patient states that he is limiting his fast food intake and has increased water intake and has had no further symptoms since his emergency department visit.       Mother states that patient has been compliant with his blood pressure medication as well as daily aspirin.  Patient reports no unusual bruising or bleeding.  Patient has had no episodes of chest pain and no sensation of palpitations.  Patient continues to have some issues with his balance and has to be careful while walking to help prevent falls.  Patient also will place his hand on or hold onto nearby objects to balance himself while walking.  Patient is not currently able to work since he had his recent intracerebral hemorrhage in April of this year.  Patient is currently living with his mother and his mother states that she is currently trying to take care of all of the patient's medical expenses as well as household expenses on her retirement income.  She reports that her son has been good company during the current quarantine secondary to the 1219 pandemic.  She states  that she and her son mostly watch television and talk with one another.  She has noticed that since patient stopped drinking alcohol that he now drinks multiple cups of coffee with sugar throughout the day.  Patient denies any issues with difficulty sleeping.  He has had no sensation of increased heart rate related to his coffee intake.  He denies headache related to his blood pressure.  He denies any abdominal pain or unusual bruising or bleeding related to his daily use of aspirin.  He does wonder if he needs to continue use of the filter that was placed to help prevent blood clots from entering his lungs. (I also discussed with the patient why it was important to have the IVC filter during his initial telemedicine visit) both patient and mother report that the swelling in his legs that occurred prior to his diagnosis of bilateral DVTs has greatly improved.  Patient wonders when he can start driving again as he has not driven since his hospitalization for his brain bleed.        Past Medical History:  Diagnosis Date  . ETOH abuse 01/28/2019  . Leg DVT (deep venous thromboembolism), acute, bilateral (HCC) 01/28/2019  . Pulmonary embolus (HCC) 01/28/2019    Past Surgical History:  Procedure Laterality Date  . HERNIA REPAIR    . IR IVC FILTER PLMT / S&I /IMG GUID/MOD SED  02/25/2019  . TONSILLECTOMY      Family History  Problem Relation Age of Onset  .  COPD Mother   . Cancer Mother        Breast x2  . Anuerysm Father     Social History   Socioeconomic History  . Marital status: Single    Spouse name: Not on file  . Number of children: Not on file  . Years of education: Not on file  . Highest education level: Not on file  Occupational History  . Not on file  Social Needs  . Financial resource strain: Not on file  . Food insecurity    Worry: Not on file    Inability: Not on file  . Transportation needs    Medical: Not on file    Non-medical: Not on file  Tobacco Use  . Smoking  status: Current Every Day Smoker    Packs/day: 0.75    Types: Cigarettes  . Smokeless tobacco: Never Used  Substance and Sexual Activity  . Alcohol use: Not Currently    Alcohol/week: 14.0 standard drinks    Types: 14 Cans of beer per week    Comment: Patient states no alcohol x 1 month  . Drug use: Not Currently    Types: Marijuana  . Sexual activity: Not on file  Lifestyle  . Physical activity    Days per week: Not on file    Minutes per session: Not on file  . Stress: Not on file  Relationships  . Social Herbalist on phone: Not on file    Gets together: Not on file    Attends religious service: Not on file    Active member of club or organization: Not on file    Attends meetings of clubs or organizations: Not on file    Relationship status: Not on file  . Intimate partner violence    Fear of current or ex partner: Not on file    Emotionally abused: Not on file    Physically abused: Not on file    Forced sexual activity: Not on file  Other Topics Concern  . Not on file  Social History Narrative  . Not on file    Outpatient Medications Prior to Visit  Medication Sig Dispense Refill  . amLODipine (NORVASC) 10 MG tablet Take 1 tablet (10 mg total) by mouth daily. To control blood pressure 30 tablet 4  . aspirin 81 MG EC tablet Take 1 tablet (81 mg total) by mouth daily. 30 tablet 11  . metoprolol tartrate (LOPRESSOR) 25 MG tablet Take 1 tablet (25 mg total) by mouth 2 (two) times daily. For blood pressure/heart rate 60 tablet 4  . pantoprazole (PROTONIX) 40 MG tablet Take 1 tablet (40 mg total) by mouth daily. 30 tablet 11  . acetaminophen (TYLENOL) 325 MG tablet Take 2 tablets (650 mg total) by mouth every 4 (four) hours as needed for mild pain (or temp > 37.5 C (99.5 F)). (Patient not taking: Reported on 05/09/2019)     No facility-administered medications prior to visit.     No Known Allergies  ROS Review of Systems  Constitutional: Positive for fatigue  (improving). Negative for chills and fever.  HENT: Negative for sore throat and trouble swallowing.   Eyes: Negative for photophobia and visual disturbance.  Respiratory: Negative for cough and shortness of breath.   Cardiovascular: Negative for chest pain and palpitations.  Gastrointestinal: Negative for abdominal pain, constipation, diarrhea and nausea.  Endocrine: Negative for cold intolerance, heat intolerance, polydipsia, polyphagia and polyuria.  Genitourinary: Negative for dysuria and frequency.  Musculoskeletal:  Positive for gait problem. Negative for arthralgias and back pain.  Neurological: Negative for dizziness and headaches.  Hematological: Negative for adenopathy. Does not bruise/bleed easily.      Objective:    Physical Exam  Constitutional: He is oriented to person, place, and time. He appears well-developed and well-nourished.  Older male in NAD sitting on exam table; he is accompanied by his mother at today's visit  Neck: Normal range of motion. Neck supple. No JVD present. No thyromegaly present.  Has some anterior neck fullness below the chin but no palpable thyromegaly or mass  Cardiovascular: Normal rate and regular rhythm.  Pulmonary/Chest: Effort normal and breath sounds normal.  Abdominal: Soft. There is no abdominal tenderness. There is no rebound and no guarding.  Mild abdominal distension/truncal obesity but no tenderness to palpation  Genitourinary:    Genitourinary Comments: No CVA tenderness   Musculoskeletal:        General: Edema (mild bilateral LE edema/varicose veins left greater than right nontender) present. No tenderness or deformity.  Lymphadenopathy:    He has no cervical adenopathy.  Neurological: He is alert and oriented to person, place, and time.  Has slow response time to questions and slow speech  Skin: Skin is warm and dry.  Abrasion to left lower leg above ankle that is scabbed but no erythema and no increased; presence of varicose  veins  Psychiatric: He has a normal mood and affect. His behavior is normal.  Mildly flattened affect and slow response time with speech  Nursing note and vitals reviewed.   BP 120/80   Pulse 61   Temp (!) 97.5 F (36.4 C) (Oral)   Ht  (1.727 m)   Wt 175 lb 6.4 oz (79.6 kg)   SpO2 98%   BMI 26.67 kg/m  Wt Readings from Last 3 Encounters:  06/06/19 175 lb 6.4 oz (79.6 kg)  03/21/19 172 lb (78 kg)  03/05/19 172 lb 6.4 oz (78.2 kg)     Health Maintenance Due  Topic Date Due  . Hepatitis C Screening  [birth year]  . HIV Screening  02/08/1975  . TETANUS/TDAP  02/08/1979  . COLONOSCOPY  02/07/2010     No results found for: TSH Lab Results  Component Value Date   WBC 10.8 (H) 05/25/2019   HGB 14.1 05/25/2019   HCT 43.1 05/25/2019   MCV 90.9 05/25/2019   PLT 379 05/25/2019   Lab Results  Component Value Date   NA 139 05/25/2019   K 3.7 05/25/2019   CO2 24 05/25/2019   GLUCOSE 162 (H) 05/25/2019   BUN 15 05/25/2019   CREATININE 1.72 (H) 05/25/2019   BILITOT 0.7 03/03/2019   ALKPHOS 53 03/03/2019   AST 17 03/03/2019   ALT 13 03/03/2019   PROT 7.0 03/03/2019   ALBUMIN 3.7 03/03/2019   CALCIUM 9.5 05/25/2019   ANIONGAP 10 05/25/2019   Lab Results  Component Value Date   CHOL 249 (H) 02/25/2019   Lab Results  Component Value Date   HDL 47 02/25/2019   Lab Results  Component Value Date   LDLCALC 157 (H) 02/25/2019   Lab Results  Component Value Date   TRIG 225 (H) 02/25/2019   Lab Results  Component Value Date   CHOLHDL 5.3 02/25/2019   Lab Results  Component Value Date   HGBA1C 5.7 (H) 02/25/2019      Assessment & Plan:  1. Paroxysmal atrial fibrillation [facility]); encounter for examination following treatment at hospital (status post ED visit 05/25/2019  through 05/26/2019) Discussed paroxysmal atrial fibrillation with patient and mother at today's visit.  Patient is to continue the use of daily 81 mg aspirin for anticoagulant due to his paroxysmal  atrial fibrillation which increases the risk of embolic stroke.  Patient will also continue the use of metoprolol to control heart rate.  Patient's emergency department visit notes were reviewed at today's visit and discussed with the patient and his mother.  Patient is encouraged to remain hydrated as part of his symptoms for which he sought recent emergency department visit were likely related to dehydration as well as food choices.  2. Essential hypertension Discussed blood pressure goal of 130/80 or less.  Blood pressure is stable/controlled to today's visit with BP reading of 120/80.  Patient with history of intracerebral hemorrhage on 02/23/2019 while on Eliquis for anticoagulation due to bilateral lower extremity DVTs and pulmonary embolism for which patient was admitted to the hospital on 3/17 through 02/03/2019.  Continue amlodipine and metoprolol for control of blood pressure. - Basic Metabolic Panel  3. Pre-diabetes; Elevated random blood glucose level Discussed with patient and mother that patient had hemoglobin A1c during hospitalization in April that was slightly abnormal at 5.7 indicating a future increased risk of developing diabetes.  Patient additionally has had random blood sugars on other BMPs which was slightly elevated.  At his emergency department visit on 05/25/2019 patient with elevated glucose of 162.  Will repeat hemoglobin A1c at today's visit.  Discussed low carbohydrate, low concentrated sweets diet.  Patient also with history of EtOH abuse but his mother reports that he has stopped all use of alcohol. - HgB A1c - Basic Metabolic Panel  5. Stage 3 chronic kidney disease (HCC) Patient will have repeat BMP as recent creatinine during his ED visit was elevated at 1.72 and patient is encouraged to avoid nonsteroidal anti-inflammatories, remain hydrated and make sure that his blood pressure remains controlled.  Creatinine on 03/03/2019 was within normal at 1.13. - Basic Metabolic  Panel  6. 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  Patient has been attending rehabilitation/physical therapy.  Patient would like to drive however patient with history of EtOH abuse and likely has some cognitive impairment in addition to his issues with gait and speech output as a result of a stroke.  Patient/mother encouraged to speak with patient's rehabilitation physician and therapist regarding patient safety to drive.  At this time, patient was told that he was not allowed to drive or operate a motor vehicle on roadways but with supervision of his mother or another adult, patient may practice driving an empty parking lot where there are no other people or vehicles present to see if he feels that he can safely operate a vehicle but it is not recommended that he drive or operate a motor vehicle independently at this time or where patient may present a danger to himself or others.  7. History of DVT (deep vein thrombosis); History of pulmonary embolism Discussed patient's history of DVT and pulmonary embolism.  Patient with hospitalization in March due to bilateral lower extremity DVTs with resultant pulmonary embolism.  Patient was placed on Eliquis however developed intracranial hemorrhage.  Patient had placement of an IVC filter and I discussed with the patient that because he can no longer take stronger anticoagulants due to his prior intracerebral hemorrhage  that the IVC filter is necessary to help prevent passage of blood clots to the pulmonary system.  Patient however should continue the use of aspirin 81 mg daily to help prevent embolic stroke due to his PAF- paroxysmal atrial fibrillation and patient seem to understand the reasoning behind his filter as well as daily aspirin use.  -Patient and mother  were also able to meet with the social worker at today's visit.  Social work consult had been placed at patient's initial visit which was conducted by telephone and social worker has been contacting patient and his mother in the meantime but were able to meet in person at today's visit.   An After Visit Summary was printed and given to the patient.  Follow-up: Return in about 4 months (around 10/07/2019) for chronic issues and f/u as needed .   Cain Saupeammie Jalil Lorusso, MD

## 2019-06-07 LAB — BASIC METABOLIC PANEL WITH GFR
BUN/Creatinine Ratio: 9 (ref 9–20)
BUN: 11 mg/dL (ref 6–24)
CO2: 23 mmol/L (ref 20–29)
Calcium: 9.9 mg/dL (ref 8.7–10.2)
Chloride: 99 mmol/L (ref 96–106)
Creatinine, Ser: 1.24 mg/dL (ref 0.76–1.27)
GFR calc Af Amer: 73 mL/min/1.73
GFR calc non Af Amer: 63 mL/min/1.73
Glucose: 99 mg/dL (ref 65–99)
Potassium: 5.1 mmol/L (ref 3.5–5.2)
Sodium: 138 mmol/L (ref 134–144)

## 2019-06-09 ENCOUNTER — Telehealth: Payer: Self-pay | Admitting: Family Medicine

## 2019-06-09 ENCOUNTER — Telehealth: Payer: Self-pay | Admitting: *Deleted

## 2019-06-09 MED FILL — ?METOPROLOL 25 MG TABLET: 25 | 30 days supply | Qty: 60 | Fill #1

## 2019-06-09 MED FILL — ?AMLODIPINE BESYLATE 10 MG: 10 | 30 days supply | Qty: 30 | Fill #1

## 2019-06-09 NOTE — Telephone Encounter (Signed)
Patient mother came into office requesting a copy of pt lab results. Mother verified pt and dob and her name and a copy was given to her.

## 2019-06-09 NOTE — Telephone Encounter (Signed)
Patients mother returned call, please follow up with results when possible.

## 2019-06-10 NOTE — BH Specialist Note (Signed)
Integrated Behavioral Health Initial Visit  MRN: [id number] Name: Cody Porter  Number of [place] Clinician visits:: 1/6 Session Start time: 12:00 PM  Session End time: 12:15 PM Total time: 15 minutes  Type of Service: [place] Interpretor:No. Interpretor Name and Language: NA   Warm Hand Off Completed.       SUBJECTIVE: Cody Porter is a 58 y.o. male accompanied by Mother Patient was referred by Dr. Chapman Fitch for resources. Patient reports the following symptoms/concerns: Pt has ongoing medical conditions and limited finances Duration of problem: Ongoing; Severity of problem: na  OBJECTIVE: Mood: Appropriate and Affect: Appropriate Risk of harm to self or others: No plan to harm self or others  LIFE CONTEXT: Family and Social: Pt receives strong support from his mother, who he resides with School/Work: Pt is uninsured. He has pending medicaid, application was submitted during inpatient hospital stay Self-Care: No report of substance use. He receives strong support from family Life Changes: Pt is trying to manage chronic medical conditions. He is grieving the passing of his brother last year from an unexpected heart attack  GOALS ADDRESSED: Patient will: 1. Reduce symptoms of: stress 2. Increase knowledge and/or ability of: coping skills and healthy habits  3. Demonstrate ability to: Increase healthy adjustment to current life circumstances  INTERVENTIONS: Interventions utilized: Solution-Focused Strategies  Standardized Assessments completed: GAD-7 and PHQ 2&9  ASSESSMENT: Patient currently experiencing stress triggered by management of chronic medical conditions. He receives strong support from family. Denies suicidal/homicidal ideations.   LCSW educated pt on the correlation between one's physical and mental health. Healthy coping skills were discussed. LCSW encouraged family to schedule appointment with Financial  Counselor, in the case he has been denied medicaid.  PLAN: 1. Follow up with behavioral health clinician on : Schedule follow up appointment, if needed 2. Behavioral recommendations: LCSW recommends pt contact financial counseling, if denied medicaid to assist with medical coverage 3. Referral(s): [place] (In Clinic) 4. "From scale of 1-10, how likely are you to follow plan?": 10  Rebekah Chesterfield, LCSW 06/10/2019 10:13 AM

## 2019-06-12 NOTE — Telephone Encounter (Signed)
Spoke with both patient and his mother and results was provided to them

## 2019-07-01 ENCOUNTER — Other Ambulatory Visit: Payer: Self-pay

## 2019-07-09 MED FILL — ?AMLODIPINE BESYLATE 10 MG: 10 | 30 days supply | Qty: 30 | Fill #2

## 2019-07-09 MED FILL — ?METOPROLOL 25 MG TABLET: 25 | 30 days supply | Qty: 60 | Fill #2

## 2019-07-16 ENCOUNTER — Other Ambulatory Visit: Payer: Self-pay | Admitting: Diagnostic Radiology

## 2019-07-17 ENCOUNTER — Encounter: Payer: Self-pay | Admitting: Radiology

## 2019-07-17 ENCOUNTER — Ambulatory Visit
Admission: RE | Admit: 2019-07-17 | Discharge: 2019-07-17 | Disposition: A | Discharge status: Home / Self Care | Payer: Self-pay | Source: Ambulatory Visit | Attending: Diagnostic Radiology | Admitting: Diagnostic Radiology

## 2019-07-17 DIAGNOSIS — Z86718 Personal history of other venous thrombosis and embolism: Secondary | ICD-10-CM

## 2019-07-17 HISTORY — PX: IR RADIOLOGIST EVAL & MGMT: IMG5224

## 2019-07-17 NOTE — Progress Notes (Signed)
Chief Complaint: Patient was seen in consultation today for IVC filter follow-up   History of Present Illness: Cody Porter is a 58 y.o. male with history of pulmonary embolism and DVT in March 2020.  Patient was placed on Eliquis.  Patient suffered an intracranial hemorrhage and was taken off Eliquis and IVC filter was placed on 02/25/2019.  Patient is now on aspirin therapy.  Patient has a history of alcohol dependence but stopped drinking this year.  Patient is living with his mother and she helps him with his daily activities and his medications.  Patient has no complaints today.  However, when I asked him about his lower extremity swelling, he said there has been swelling in his ankles since the DVTs.  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  He does not complain of leg pain.  Patient did not want to undergo lower extremity venous duplex examination today.  Past Medical History:  Diagnosis Date  . ETOH abuse 01/28/2019  . Leg DVT (deep venous thromboembolism), acute, bilateral (HCC) 01/28/2019  . Pulmonary embolus (HCC) 01/28/2019    Past Surgical History:  Procedure Laterality Date  . HERNIA REPAIR    . IR IVC FILTER PLMT / S&I /IMG GUID/MOD SED  02/25/2019  . IR RADIOLOGIST EVAL & MGMT  07/17/2019  . TONSILLECTOMY      Allergies: Patient has no known allergies.  Medications: Prior to Admission medications   Medication Sig Start Date End Date Taking? Authorizing Provider  acetaminophen (TYLENOL) 325 MG tablet Take 2 tablets (650 mg total) by mouth every 4 (four) hours as needed for mild pain (or temp > 37.5 C (99.5 F)). Patient not taking: Reported on 05/09/2019 03/07/19   Angiulli, Mcarthur Rossetti, PA-C  amLODipine (NORVASC) 10 MG tablet Take 1 tablet (10 mg total) by mouth daily. To control blood pressure 05/09/19   Fulp, Cammie, MD  aspirin 81 MG EC tablet Take 1 tablet (81 mg total) by mouth daily. 05/09/19   Fulp, Cammie, MD   metoprolol tartrate (LOPRESSOR) 25 MG tablet Take 1 tablet (25 mg total) by mouth 2 (two) times daily. For blood pressure/heart rate 05/09/19   Fulp, Cammie, MD  pantoprazole (PROTONIX) 40 MG tablet Take 1 tablet (40 mg total) by mouth daily. 05/09/19   Cain Saupe, MD     Family History  Problem Relation Age of Onset  . COPD Mother   . Cancer Mother        Breast x2  . Anuerysm Father     Social History   Socioeconomic History  . Marital status: Single    Spouse name: Not on file  . Number of children: Not on file  . Years of education: Not on file  . Highest education level: Not on file  Occupational History  . Not on file  Social Needs  . Financial resource strain: Not on file  . Food insecurity    Worry: Not on file    Inability: Not on file  . Transportation needs    Medical: Not on file    Non-medical: Not on file  Tobacco Use  . Smoking status: Current Every Day Smoker    Packs/day: 0.75    Types: Cigarettes  . Smokeless tobacco: Never Used  Substance and Sexual Activity  . Alcohol use: Not Currently    Alcohol/week: 14.0 standard drinks    Types: 14 Cans of beer per week    Comment: Patient  states no alcohol x 1 month  . Drug use: Not Currently    Types: Marijuana  . Sexual activity: Not on file  Lifestyle  . Physical activity    Days per week: Not on file    Minutes per session: Not on file  . Stress: Not on file  Relationships  . Social Musicianconnections    Talks on phone: Not on file    Gets together: Not on file    Attends religious service: Not on file    Active member of club or organization: Not on file    Attends meetings of clubs or organizations: Not on file    Relationship status: Not on file  Other Topics Concern  . Not on file  Social History Narrative  . Not on file      Review of Systems  Musculoskeletal:       Bilateral lower extremity swelling.    Vital Signs: There were no vitals taken for this visit.  Physical Exam  Constitutional:      General: He is not in acute distress. Musculoskeletal:        General: Swelling present.     Comments: Significant swelling in both ankles.  No significant swelling in the upper calves.  Patient has palpable varicosities along the medial lower thighs bilaterally.  Neurological:     Mental Status: He is alert.         Imaging: Ir Radiologist Eval & Mgmt  Result Date: 07/17/2019 Please refer to notes tab for details about interventional procedure. (Op Note)   Labs:  CBC: Recent Labs    02/28/19 0846 03/01/19 0440 03/03/19 0725 05/25/19 2308  WBC 6.5 7.5 6.6 10.8*  HGB 16.3 15.8 16.8 14.1  HCT 50.0 46.7 51.6 43.1  PLT 297 294 334 379    COAGS: Recent Labs    01/28/19 1210 02/23/19 1818  INR 0.9 1.0  APTT 38* 37*    BMP: Recent Labs    03/01/19 0440 03/03/19 0725 05/25/19 2308 06/06/19 1203  NA 136 138 139 138  K 3.8 3.5 3.7 5.1  CL 100 98 105 99  CO2 24 28 24 23   GLUCOSE 103* 130* 162* 99  BUN 12 14 15 11   CALCIUM 9.5 9.6 9.5 9.9  CREATININE 0.96 1.13 1.72* 1.24  GFRNONAA >60 >60 43* 63  GFRAA >60 >60 49* 73    LIVER FUNCTION TESTS: Recent Labs    01/29/19 0217 02/01/19 0253 02/23/19 1639 03/03/19 0725  BILITOT 1.2 0.7 0.5 0.7  AST 19 17 15 17   ALT 14 15 14 13   ALKPHOS 61 52 57 53  PROT 7.2 6.3* 7.5 7.0  ALBUMIN 3.2* 2.6* 3.8 3.7    TUMOR MARKERS: No results for input(s): AFPTM, CEA, CA199, CHROMGRNA in the last 8760 hours.  Assessment and Plan:  58 year old male with history of DVT/PE and IVC filter was placed due to intracranial hemorrhages while the patient was on Eliquis.  The patient is alert and oriented but there appears to be some confusion.  I had the patient's mother join us for the visit because but she seems to be very active in his healthcare decisions.  We discussed IVC filters in general.  Explained how IVC filter is typically used when patient is not an anticoagulation candidate.  We discussed the  short-term benefits of an IVC filter and some of the long-term complications associated with a filter including filter fracture, migration and increased risk of DVT or caval thrombosis.  I  feel like the patient and his mother have a good understanding of the IVC filter situation.  At this time, the patient has not restarted anticoagulation and based on his extensive lower extremity DVT and pulmonary embolism that he had in March, he will likely need an IVC filter unless he restarts anticoagulation.  Not clear if the patient will ever be a candidate for anticoagulation again.  At this time, we will consider the IVC filter permanent.  Patient and his mother were comfortable with this plan.  However, there is not a time limit for retrieval of the [employer] filter and we could revisit filter retrieval in the future.  During my examination and interview with the patient, I discover the patient has significant swelling in both ankles.  Patient did not want to undergo lower extremity venous duplex examination today because he does not want pursue IVC filter retrieval at this time.  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  In addition, the patient probably has underlying venous insufficiency based on the history of varicosities.  I gave the patient a prescription for compression stockings and recommend he try at least knee-high stockings.  I recommend follow-up with his primary care provider to see if the swelling improves with the compression stockings.  If the swelling does not improve, he will need follow-up lower extremity venous duplex imaging and may even need to be evaluated for his superficial venous insufficiency.  Patient can follow-up with IR as needed.   Electronically Signed: Burman Riis 07/17/2019, 4:14 PM   I spent a total of    25 Minutes in face to face in clinical consultation, greater than 50% of which was  counseling/coordinating care for IVC filter management. patient ID: Cody Porter, male   DOB: 25-May-1961, 58 y.o.   MRN: [id number]

## 2019-08-07 MED FILL — METOPROLOL TARTRATE 25 MG T: 25 | 30 days supply | Qty: 60 | Fill #3

## 2019-08-07 MED FILL — ?AMLODIPINE BESYLATE 10 MG: 10 | 30 days supply | Qty: 30 | Fill #3

## 2019-09-08 MED FILL — ?METOPROLOL 25 MG TABLET: 25 | 30 days supply | Qty: 60 | Fill #4

## 2019-09-08 MED FILL — ?AMLODIPINE BESYLATE 10 MG: 10 | 30 days supply | Qty: 30 | Fill #4

## 2019-10-10 ENCOUNTER — Other Ambulatory Visit: Payer: Self-pay | Admitting: Physical Medicine & Rehabilitation

## 2019-10-10 DIAGNOSIS — I4891 Unspecified atrial fibrillation: Secondary | ICD-10-CM

## 2019-10-10 DIAGNOSIS — I10 Essential (primary) hypertension: Secondary | ICD-10-CM

## 2019-10-13 ENCOUNTER — Ambulatory Visit: Payer: Self-pay | Admitting: Family Medicine

## 2019-10-13 ENCOUNTER — Other Ambulatory Visit: Payer: Self-pay | Admitting: Family Medicine

## 2019-10-13 DIAGNOSIS — I10 Essential (primary) hypertension: Secondary | ICD-10-CM

## 2019-10-13 DIAGNOSIS — I4891 Unspecified atrial fibrillation: Secondary | ICD-10-CM

## 2019-10-13 MED FILL — ?AMLODIPINE BESYLATE 10 MG: 10 | 30 days supply | Qty: 30 | Fill #0

## 2019-10-15 ENCOUNTER — Other Ambulatory Visit: Payer: Self-pay

## 2019-10-15 ENCOUNTER — Ambulatory Visit: Payer: Self-pay | Attending: Family Medicine | Admitting: Family Medicine

## 2019-10-15 ENCOUNTER — Encounter: Payer: Self-pay | Admitting: Family Medicine

## 2019-10-15 VITALS — BP 122/78 | HR 70 | Temp 98.5°F | Resp 18 | Ht 67.0 in | Wt 177.0 lb

## 2019-10-15 DIAGNOSIS — I480 Paroxysmal atrial fibrillation: Secondary | ICD-10-CM

## 2019-10-15 DIAGNOSIS — I10 Essential (primary) hypertension: Secondary | ICD-10-CM

## 2019-10-15 DIAGNOSIS — Z87898 Personal history of other specified conditions: Secondary | ICD-10-CM

## 2019-10-15 DIAGNOSIS — R7303 Prediabetes: Secondary | ICD-10-CM

## 2019-10-15 LAB — POCT GLYCOSYLATED HEMOGLOBIN (HGB A1C): Hemoglobin A1C: 5.6 % (ref 4.0–5.6)

## 2019-10-15 NOTE — Patient Instructions (Signed)
Continue your current medications and work on decreasing your number of cigarettes smoked per day with goal of quitting tobacco use. Your blood sugars were normal over the past 90 days.

## 2019-10-15 NOTE — Progress Notes (Signed)
Established Patient Office Visit  Subjective:  Patient ID: Cody Porter, male    DOB: 04/02/1961  Age: 58 y.o. MRN: [id number]  CC: No chief complaint on file.   HPI Cody Porter presents for follow-up of chronic issues as well as prediabetes follow-up.  Patient has history of prior CVA as well as history of atrial fibrillation. He was previously on blood thinning medication, Eliquis, after having bilateral DVT and PE in March of this year.  He also was discovered to have paroxysmal atrial fibrillation during his hospitalization for which he was placed on metoprolol in addition to Eliquis.  He also had acute metabolic encephalopathy upon initial hospitalization due to alcohol withdrawal but patient reports that he has not had any further alcohol since his hospitalization in March.  Patient unfortunately had a nontraumatic intracerebral hemorrhage in April which was thought to be related to his use of Eliquis as a blood thinner.  Patient's Eliquis was stopped and he had placement of IVC filter due to his history of bilateral DVT and pulmonary embolism as he could no longer remain on Eliquis after the intracerebral hemorrhage.  He is currently on a daily baby aspirin as anticoagulant due to history of paroxysmal atrial fibrillation and prior DVTs and PE.  He is on amlodipine and metoprolol for hypertension and heart rate control.  He is additionally on pantoprazole for stomach protection.  He reports that he is taking the daily baby aspirin and he reports no unusual bruising or bleeding.  He states that he is not currently taking his blood pressure medications.  In July of this year he had hemoglobin A1c of 5.7 consistent with prediabetes.  Patient reports that he does not have any increased thirst or urinary frequency related to his blood sugars.  He has not always been able to afford food over the past few months and he believes that this is why his blood sugars will likely be better controlled with  today's test. 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  Patient usually accompanied by his mother who is able to give more accurate medical history/information however due to COVID-19 pandemic, patient's mother is waiting for patient in the car at today's visit).  Patient reports he continues to smoke that he knows that he needs to stop smoking.  He reports no current issues with cough or shortness of breath, no chest pain but has occasional sensation of palpitations/abnormal heart rhythm.  He denies any syncopal episodes.  He has had no increase in swelling in his legs since his last visit.  He denies any abdominal pain-no nausea or vomiting.  He states that overall he feels well.  Past Medical History:  Diagnosis Date  . ETOH abuse 01/28/2019  . Leg DVT (deep venous thromboembolism), acute, bilateral ([place]) 01/28/2019  . Pulmonary embolus ([place]) 01/28/2019    Past Surgical History:  Procedure Laterality Date  . HERNIA REPAIR    . IR IVC FILTER PLMT / S&I /IMG GUID/MOD SED  02/25/2019  . IR RADIOLOGIST EVAL & MGMT  07/17/2019  . TONSILLECTOMY      Family History  Problem Relation Age of Onset  . COPD Mother   . Cancer Mother        Breast x2  . Anuerysm Father     Social History   Socioeconomic History  . Marital status: Single    Spouse name: Not on file  .  Number of children: Not on file  . Years of education: Not on file  . Highest education level: Not on file  Occupational History  . Not on file  Social Needs  . Financial resource strain: Not on file  . Food insecurity    Worry: Not on file    Inability: Not on file  . Transportation needs    Medical: Not on file    Non-medical: Not on file  Tobacco Use  . Smoking status: Current Every Day Smoker    Packs/day: 0.75    Types: Cigarettes  . Smokeless tobacco: Never Used  Substance and Sexual Activity  . Alcohol use:  Not Currently    Alcohol/week: 14.0 standard drinks    Types: 14 Cans of beer per week    Comment: Patient states no alcohol x 1 month  . Drug use: Not Currently    Types: Marijuana  . Sexual activity: Not on file  Lifestyle  . Physical activity    Days per week: Not on file    Minutes per session: Not on file  . Stress: Not on file  Relationships  . Social Musician on phone: Not on file    Gets together: Not on file    Attends religious service: Not on file    Active member of club or organization: Not on file    Attends meetings of clubs or organizations: Not on file    Relationship status: Not on file  . Intimate partner violence    Fear of current or ex partner: Not on file    Emotionally abused: Not on file    Physically abused: Not on file    Forced sexual activity: Not on file  Other Topics Concern  . Not on file  Social History Narrative  . Not on file    Outpatient Medications Prior to Visit  Medication Sig Dispense Refill  . acetaminophen (TYLENOL) 325 MG tablet Take 2 tablets (650 mg total) by mouth every 4 (four) hours as needed for mild pain (or temp > 37.5 C (99.5 F)). (Patient not taking: Reported on 05/09/2019)    . amLODipine (NORVASC) 10 MG tablet TAKE 1 TABLET BY MOUTH EVERY DAY 30 tablet 0  . amLODipine (NORVASC) 10 MG tablet TAKE 1 TABLET (10 MG TOTAL) BY MOUTH DAILY. TO CONTROL BLOOD PRESSURE 30 tablet 4  . aspirin 81 MG EC tablet Take 1 tablet (81 mg total) by mouth daily. 30 tablet 11  . metoprolol tartrate (LOPRESSOR) 25 MG tablet Take 1 tablet (25 mg total) by mouth 2 (two) times daily. For blood pressure/heart rate 60 tablet 4  . pantoprazole (PROTONIX) 40 MG tablet Take 1 tablet (40 mg total) by mouth daily. 30 tablet 11   No facility-administered medications prior to visit.     No Known Allergies  ROS Review of Systems  Constitutional: Positive for fatigue (mild). Negative for chills and fever.  HENT: Negative for sore throat and  trouble swallowing.   Eyes: Negative for photophobia and visual disturbance.  Respiratory: Negative for cough and shortness of breath.   Cardiovascular: Positive for palpitations. Negative for chest pain and leg swelling.  Gastrointestinal: Negative for abdominal pain, blood in stool, constipation and diarrhea.  Endocrine: Negative for polydipsia, polyphagia and polyuria.  Genitourinary: Negative for dysuria, frequency and hematuria.  Musculoskeletal: Negative for arthralgias and back pain.  Neurological: Negative for dizziness and headaches.  Hematological: Negative for adenopathy. Does not bruise/bleed easily.  Objective:    Physical Exam  Constitutional: He is oriented to person, place, and time. He appears well-developed and well-nourished.  Neck: Normal range of motion. Neck supple. No JVD present. No thyromegaly present.  Cardiovascular: Normal rate and regular rhythm.  Heart sounds were somewhat distant but sounded as if patient was in normal sinus rhythm at this time  Pulmonary/Chest: Effort normal and breath sounds normal.  Abdominal: Soft. There is no abdominal tenderness. There is no rebound and no guarding.  Musculoskeletal:        General: No tenderness or edema.  Lymphadenopathy:    He has no cervical adenopathy.  Neurological: He is alert and oriented to person, place, and time.  Skin:  Patient with erythematous appearance to the skin at today's visit, no increased warmth  Psychiatric: He has a normal mood and affect. His behavior is normal.  Nursing note and vitals reviewed.   BP 122/78 (BP Location: Left Arm, Patient Position: Sitting, Cuff Size: Normal)   Pulse 70   Temp 98.5 F (36.9 C) (Oral)   Resp 18   Ht  (1.702 m)   Wt 177 lb (80.3 kg)   SpO2 100%   BMI 27.72 kg/m  Wt Readings from Last 3 Encounters:  10/15/19 177 lb (80.3 kg)  06/06/19 175 lb 6.4 oz (79.6 kg)  03/21/19 172 lb (78 kg)     Health Maintenance Due  Topic Date Due  .  Hepatitis C Screening  February 06, 1961  . HIV Screening  02/08/1975  . TETANUS/TDAP  02/08/1979  . COLONOSCOPY  02/07/2010  . INFLUENZA VACCINE  06/14/2019    Patient will have influenza immunization at today's visit as nurse visit  No results found for: TSH Lab Results  Component Value Date   WBC 10.8 (H) 05/25/2019   HGB 14.1 05/25/2019   HCT 43.1 05/25/2019   MCV 90.9 05/25/2019   PLT 379 05/25/2019   Lab Results  Component Value Date   NA 138 06/06/2019   K 5.1 06/06/2019   CO2 23 06/06/2019   GLUCOSE 99 06/06/2019   BUN 11 06/06/2019   CREATININE 1.24 06/06/2019   BILITOT 0.7 03/03/2019   ALKPHOS 53 03/03/2019   AST 17 03/03/2019   ALT 13 03/03/2019   PROT 7.0 03/03/2019   ALBUMIN 3.7 03/03/2019   CALCIUM 9.9 06/06/2019   ANIONGAP 10 05/25/2019   Lab Results  Component Value Date   CHOL 249 (H) 02/25/2019   Lab Results  Component Value Date   HDL 47 02/25/2019   Lab Results  Component Value Date   LDLCALC 157 (H) 02/25/2019   Lab Results  Component Value Date   TRIG 225 (H) 02/25/2019   Lab Results  Component Value Date   CHOLHDL 5.3 02/25/2019   Lab Results  Component Value Date   HGBA1C 5.6 10/15/2019      Assessment & Plan:  1. History of prediabetes Patient with hemoglobin A1c earlier this year 5.7.  Patient had recheck of A1c today and hemoglobin A1c was within normal at 5.6.  Unfortunately, patient is having issues with food insecurity and this may be the reason for his decrease in A1c.  Referral will be placed to social work to contact patient's mother to see if there is any needed assistance. - HgB A1c  2. Essential hypertension Patient with good control of blood pressure at today's visit.  Patient unfortunately has history of CVA and EtOH abuse and is not accompanied by his mother at today's visit  who is a more reliable source of information.  Patient states that he is not currently taking his blood pressure medication every day but I am not  sure if this is factual because when patient was asked if he needed refills of his medications he stated that his mother has taken care of picking up his needed refills.  3. Paroxysmal atrial fibrillation (HCC) He reports occasional sensation of abnormal heart rhythm.  No chest pain.  No episodes of focal numbness or weakness.  He continues to take daily aspirin and denies any unusual bruising or bleeding.  Heart rate is currently controlled.   An After Visit Summary was printed and given to the patient.  Follow-up: Return in about 4 months (around 02/13/2020) for HTN/afib and as needed.   Cain Saupeammie Gray Doering, MD

## 2019-11-10 MED FILL — ?METOPROLOL 25 MG TABLET: 25 | 30 days supply | Qty: 60 | Fill #0

## 2019-11-10 MED FILL — ?AMLODIPINE BESYLATE 10 MG: 10 | 30 days supply | Qty: 30 | Fill #1

## 2019-11-21 ENCOUNTER — Telehealth: Payer: Self-pay | Admitting: Licensed Clinical Social Worker

## 2019-11-21 NOTE — Telephone Encounter (Signed)
Call placed to patient regarding [facility] referral. LCSW left message for a return call.  

## 2019-11-21 NOTE — Telephone Encounter (Signed)
Call placed to family to inquire about any behavioral health and/or resource needs. Pt's mother shared that the family has been experiencing food insecurity due to financial strain. Pt was recently denied Medicaid, in addition, to [employer]. They are living on her fixed income.   LCSW provided support and encouragement. Family was informed of supportive services to assist with disability and medicaid appeals provided through [employer]. Family was also provided contact information for [employer] to assist with food insecurity. Family was appreciative for the assistance.   LCSW faxed completed referral to [employer]. No additional concerns noted.

## 2019-11-27 ENCOUNTER — Telehealth: Payer: Self-pay | Admitting: Licensed Clinical Social Worker

## 2019-11-27 NOTE — Telephone Encounter (Signed)
MSW Intern placed call to patient in response to a voicemail left for [name], LCSW on 11/21/19 requesting a return call. MSW Intern spoke with patient's mother, "Cody Porter". Cody Porter reported that she and Cody Porter's apartment rent is set to increase on 12/2019 due to a change in management. Cody Porter is in search of affordable senior housing for her and Cody Porter. Cody Porter stated that she has been in contact with Cody Porter), but [facility] only has high rise apartments available.   Cody Porter reported that Cody Porter from [employer] contacted her this morning. During phone call with MSW Intern, Cody Porter expressed a desire to appeal Cody Porter's recent disability denial. This MSW Intern encouraged Cody Porter to contact [employer] again and request assistance in appealing Cody Porter's disability.  MSW Intern is not aware of additional affordable housing options available at this time. MSW Intern will continue to look for affordable options and contact [name], LCSW for suggestions.

## 2019-12-09 MED FILL — ?METOPROLOL 25 MG TABLET: 25 | 30 days supply | Qty: 60 | Fill #0

## 2019-12-09 MED FILL — AMLODIPINE BESYLATE 10 MG T: 10 | 30 days supply | Qty: 30 | Fill #2

## 2020-01-12 ENCOUNTER — Other Ambulatory Visit: Payer: Self-pay | Admitting: Family Medicine

## 2020-01-12 DIAGNOSIS — I4891 Unspecified atrial fibrillation: Secondary | ICD-10-CM

## 2020-01-12 MED FILL — AMLODIPINE BESYLATE 10 MG T: 10 | 30 days supply | Qty: 30 | Fill #3

## 2020-01-13 MED FILL — METOPROLOL TARTRATE 25 MG T: 25 | 30 days supply | Qty: 60 | Fill #0

## 2020-02-09 ENCOUNTER — Other Ambulatory Visit: Payer: Self-pay

## 2020-02-09 MED FILL — AMLODIPINE BESYLATE 10 MG T: 10 | 30 days supply | Qty: 30 | Fill #4

## 2020-02-09 MED FILL — METOPROLOL TARTRATE 25 MG T: 25 | 30 days supply | Qty: 60 | Fill #1

## 2020-03-03 ENCOUNTER — Other Ambulatory Visit: Payer: Self-pay | Admitting: Family Medicine

## 2020-03-03 DIAGNOSIS — I4891 Unspecified atrial fibrillation: Secondary | ICD-10-CM

## 2020-03-03 DIAGNOSIS — I10 Essential (primary) hypertension: Secondary | ICD-10-CM

## 2020-03-03 MED FILL — ?METOPROLOL TART 25MG TABLE: 25 | 30 days supply | Qty: 60 | Fill #2

## 2020-03-04 MED FILL — AMLODIPINE BESYLATE 10 MG T: 10 | 30 days supply | Qty: 30 | Fill #0

## 2020-04-07 ENCOUNTER — Other Ambulatory Visit: Payer: Self-pay | Admitting: Family Medicine

## 2020-04-07 DIAGNOSIS — I4891 Unspecified atrial fibrillation: Secondary | ICD-10-CM

## 2020-04-07 MED FILL — ?AMLODIPINE BESYL 10MG TABL: 10 | 30 days supply | Qty: 30 | Fill #1

## 2020-04-08 MED FILL — ?METOPROLOL TART 25MG TABLE: 25 | 30 days supply | Qty: 60 | Fill #0

## 2020-05-10 ENCOUNTER — Other Ambulatory Visit: Payer: Self-pay | Admitting: Family Medicine

## 2020-05-10 DIAGNOSIS — I4891 Unspecified atrial fibrillation: Secondary | ICD-10-CM

## 2020-05-10 MED FILL — ?AMLODIPINE BESYL 10MG TABL: 10 | 30 days supply | Qty: 30 | Fill #2

## 2020-05-13 ENCOUNTER — Ambulatory Visit: Payer: Self-pay | Attending: Physician Assistant | Admitting: Physician Assistant

## 2020-05-13 ENCOUNTER — Encounter: Payer: Self-pay | Admitting: Physician Assistant

## 2020-05-13 ENCOUNTER — Other Ambulatory Visit: Payer: Self-pay

## 2020-05-13 ENCOUNTER — Other Ambulatory Visit: Payer: Self-pay | Admitting: Physician Assistant

## 2020-05-13 VITALS — BP 133/92 | HR 63 | Temp 97.7°F | Wt 175.4 lb

## 2020-05-13 DIAGNOSIS — Z7982 Long term (current) use of aspirin: Secondary | ICD-10-CM

## 2020-05-13 DIAGNOSIS — I4891 Unspecified atrial fibrillation: Secondary | ICD-10-CM

## 2020-05-13 DIAGNOSIS — R7303 Prediabetes: Secondary | ICD-10-CM

## 2020-05-13 DIAGNOSIS — I10 Essential (primary) hypertension: Secondary | ICD-10-CM

## 2020-05-13 DIAGNOSIS — Z13220 Encounter for screening for lipoid disorders: Secondary | ICD-10-CM

## 2020-05-13 LAB — POCT GLYCOSYLATED HEMOGLOBIN (HGB A1C): Hemoglobin A1C: 5.6 % (ref 4.0–5.6)

## 2020-05-13 LAB — GLUCOSE, POCT (MANUAL RESULT ENTRY): POC Glucose: 107 mg/dl — AB (ref 70–99)

## 2020-05-13 MED ORDER — METOPROLOL TARTRATE 25 MG PO TABS: 25.0000 mg | ORAL_TABLET | Freq: Two times a day (BID) | ORAL | 1 refills | Status: DC

## 2020-05-13 MED ORDER — AMLODIPINE BESYLATE 10 MG PO TABS: 10.0000 mg | ORAL_TABLET | Freq: Every day | ORAL | 1 refills | Status: DC

## 2020-05-13 MED ORDER — PANTOPRAZOLE SODIUM 40 MG PO TBEC: 40.0000 mg | DELAYED_RELEASE_TABLET | Freq: Every day | ORAL | 1 refills | Status: DC

## 2020-05-13 MED ORDER — ASPIRIN 81 MG PO TBEC: 81.0000 mg | DELAYED_RELEASE_TABLET | Freq: Every day | ORAL | 1 refills | Status: DC

## 2020-05-13 MED FILL — ?PANTOPRAZOLE SO DR 40MG TA: 40 | 30 days supply | Qty: 30 | Fill #0

## 2020-05-13 MED FILL — ?METOPROLOL TART 25MG TABLE: 25 | 30 days supply | Qty: 60 | Fill #0

## 2020-05-13 NOTE — Progress Notes (Signed)
Cody Porter, is a 59 y.o. male  HYI:[id number]  NOM:[id number]  DOB - 29-Jul-1961  Subjective:  Chief Complaint and HPI: Cody Porter is a 59 y.o. male here today for med RF.  No concerns or complaints.  Out of BP meds.  No CP/palpitations or dizziness.  Here with his mom  ROS:   Constitutional:  No f/c, No night sweats, No unexplained weight loss. EENT:  No vision changes, No blurry vision, No hearing changes. No mouth, throat, or ear problems.  Respiratory: No cough, No SOB Cardiac: No CP, no palpitations GI:  No abd pain, No N/V/D. GU: No Urinary s/sx Musculoskeletal: No joint pain Neuro: No headache, no dizziness, no motor weakness.  Skin: No rash Endocrine:  No polydipsia. No polyuria.  Psych: Denies SI/HI  No problems updated.  ALLERGIES: No Known Allergies  PAST MEDICAL HISTORY: Past Medical History:  Diagnosis Date  . ETOH abuse 01/28/2019  . Leg DVT (deep venous thromboembolism), acute, bilateral (HCC) 01/28/2019  . Pulmonary embolus (HCC) 01/28/2019    MEDICATIONS AT HOME: Prior to Admission medications   Medication Sig Start Date End Date Taking? Authorizing Provider  acetaminophen (TYLENOL) 325 MG tablet Take 2 tablets (650 mg total) by mouth every 4 (four) hours as needed for mild pain (or temp > 37.5 C (99.5 F)). 03/07/19  Yes Angiulli, Mcarthur Rossetti, PA-C  amLODipine (NORVASC) 10 MG tablet Take 1 tablet (10 mg total) by mouth daily. To control blood pressure 05/13/20  Yes Astin Rape, Marzella Schlein, PA-C  aspirin 81 MG EC tablet Take 1 tablet (81 mg total) by mouth daily. 05/13/20  Yes Georgian Co M, PA-C  metoprolol tartrate (LOPRESSOR) 25 MG tablet Take 1 tablet (25 mg total) by mouth 2 (two) times daily. 05/13/20  Yes Jamesyn Moorefield M, PA-C  pantoprazole (PROTONIX) 40 MG tablet Take 1 tablet (40 mg total) by mouth daily. 05/13/20  Yes Anders Simmonds, PA-C     Objective:  EXAM:   Vitals:   05/13/20 1038  BP: (!) 133/92  Pulse: 63  Temp: 97.7 F (36.5 C)    TempSrc: Temporal  SpO2: 98%  Weight: 175 lb 6.4 oz (79.6 kg)    General appearance : A&OX3. NAD. Non-toxic-appearing HEENT: Atraumatic and Normocephalic.  PERRLA. EOM intact.  Chest/Lungs:  Breathing-non-labored, Good air entry bilaterally, breath sounds normal without rales, rhonchi, or wheezing  CVS: S1 S2 regular, no murmurs, gallops, rubs  Extremities: Bilateral Lower Ext shows no edema, both legs are warm to touch with = pulse throughout Neurology:  CN II-XII grossly intact, Non focal.   Psych:  TP linear. J/I WNL. Normal speech. Appropriate eye contact and affect.  Skin:  No Rash  Data Review Lab Results  Component Value Date   HGBA1C 5.6 05/13/2020   HGBA1C 5.6 10/15/2019   HGBA1C 5.7 06/06/2019     Assessment & Plan   1. Pre-diabetes I have had a lengthy discussion and provided education about insulin resistance and the intake of too much sugar/refined carbohydrates.  I have advised the patient to work at a goal of eliminating sugary drinks, candy, desserts, sweets, refined sugars, processed foods, and white carbohydrates.  The patient expresses understanding.   - Glucose (CBG) - HgB A1c  2. Atrial fibrillation, unspecified type (HCC) stable - amLODipine (NORVASC) 10 MG tablet; Take 1 tablet (10 mg total) by mouth daily. To control blood pressure  Dispense: 90 tablet; Refill: 1 - metoprolol tartrate (LOPRESSOR) 25 MG tablet; Take 1 tablet (25 mg total)  by mouth 2 (two) times daily.  Dispense: 180 tablet; Refill: 1 - aspirin 81 MG EC tablet; Take 1 tablet (81 mg total) by mouth daily.  Dispense: 90 tablet; Refill: 1 - Lipid panel  3. Essential hypertension Not at goal but out of meds-resume meds  - amLODipine (NORVASC) 10 MG tablet; Take 1 tablet (10 mg total) by mouth daily. To control blood pressure  Dispense: 90 tablet; Refill: 1 - Comprehensive metabolic panel - Lipid panel - CBC with Differential/Platelet  4. Long-term use of aspirin therapy - pantoprazole  (PROTONIX) 40 MG tablet; Take 1 tablet (40 mg total) by mouth daily.  Dispense: 90 tablet; Refill: 1 - CBC with Differential/Platelet  5. Screening cholesterol level - Lipid panel   Patient have been counseled extensively about nutrition and exercise  Return in about 6 months (around 11/13/2020) for PCP;  chronic conditions.  The patient was given clear instructions to go to ER or return to medical center if symptoms don't improve, worsen or new problems develop. The patient verbalized understanding. The patient was told to call to get lab results if they haven't heard anything in the next week.     Georgian Co, PA-C [facility] [place], [place] [phone]   05/13/2020, 11:08 AMPatient ID: Cody Porter, male   DOB: 02/01/1961, 59 y.o.   MRN: [id number]

## 2020-05-14 LAB — COMPREHENSIVE METABOLIC PANEL
ALT: 9 IU/L (ref 0–44)
AST: 14 IU/L (ref 0–40)
Albumin/Globulin Ratio: 1.6 (ref 1.2–2.2)
Albumin: 4.8 g/dL (ref 3.8–4.9)
Alkaline Phosphatase: 66 IU/L (ref 48–121)
BUN/Creatinine Ratio: 8 — ABNORMAL LOW (ref 10–24)
BUN: 10 mg/dL (ref 8–27)
Bilirubin Total: 0.4 mg/dL (ref 0.0–1.2)
CO2: 24 mmol/L (ref 20–29)
Calcium: 10.2 mg/dL (ref 8.6–10.2)
Chloride: 99 mmol/L (ref 96–106)
Creatinine, Ser: 1.3 mg/dL — ABNORMAL HIGH (ref 0.76–1.27)
GFR calc Af Amer: 69 mL/min/{1.73_m2} (ref >59)
GFR calc non Af Amer: 59 mL/min/{1.73_m2} — ABNORMAL LOW (ref >59)
Globulin, Total: 3 g/dL (ref 1.5–4.5)
Glucose: 99 mg/dL (ref 65–99)
Potassium: 4.7 mmol/L (ref 3.5–5.2)
Sodium: 140 mmol/L (ref 134–144)
Total Protein: 7.8 g/dL (ref 6.0–8.5)

## 2020-05-14 LAB — LIPID PANEL
Chol/HDL Ratio: 4.2 ratio (ref 0.0–5.0)
Cholesterol, Total: 198 mg/dL (ref 100–199)
HDL: 47 mg/dL (ref >39)
LDL Chol Calc (NIH): 117 mg/dL — ABNORMAL HIGH (ref 0–99)
Triglycerides: 192 mg/dL — ABNORMAL HIGH (ref 0–149)
VLDL Cholesterol Cal: 34 mg/dL (ref 5–40)

## 2020-05-14 LAB — CBC WITH DIFFERENTIAL/PLATELET
Basophils Absolute: 0.2 10*3/uL (ref 0.0–0.2)
Basos: 2 %
EOS (ABSOLUTE): 0.5 10*3/uL — ABNORMAL HIGH (ref 0.0–0.4)
Eos: 5 %
Hematocrit: 44.2 % (ref 37.5–51.0)
Hemoglobin: 14.9 g/dL (ref 13.0–17.7)
Immature Grans (Abs): 0 10*3/uL (ref 0.0–0.1)
Immature Granulocytes: 0 %
Lymphocytes Absolute: 2.5 10*3/uL (ref 0.7–3.1)
Lymphs: 27 %
MCH: 29.8 pg (ref 26.6–33.0)
MCHC: 33.7 g/dL (ref 31.5–35.7)
MCV: 88 fL (ref 79–97)
Monocytes Absolute: 0.7 10*3/uL (ref 0.1–0.9)
Monocytes: 8 %
Neutrophils Absolute: 5.5 10*3/uL (ref 1.4–7.0)
Neutrophils: 58 %
Platelets: 355 10*3/uL (ref 150–450)
RBC: 5 x10E6/uL (ref 4.14–5.80)
RDW: 12.7 % (ref 11.6–15.4)
WBC: 9.4 10*3/uL (ref 3.4–10.8)

## 2020-05-20 ENCOUNTER — Ambulatory Visit: Payer: Self-pay | Admitting: *Deleted

## 2020-05-20 NOTE — Telephone Encounter (Signed)
Pt called in and was given the message from Georgian Co dated 05/19/2020 at 1:05 Pm. No questions from pt.  Reason for Disposition . Nursing judgment or information in reference    Lab results given  Answer Assessment - Initial Assessment Questions 1. REASON FOR CALL: "What is your main concern right now?"     I'm returning a call from the office about lab results. 2. ONSET: "When did the N/A start?"     N/A 3. SEVERITY: "How bad is the N/A?"     N/A 4. FEVER: "Do you have a fever?"     N/A 5. OTHER SYMPTOMS: "Do you have any other new symptoms?"     N/A 6. INTERVENTIONS AND RESPONSE: "What have you done so far to try to make this better? What medications have you used?"     N/A 7. PREGNANCY: "Is there any chance you are pregnant?"     N/A  He was given the lab result message from Georgian Co 05/19/2020 at 1:05 PM.     No questions from pt.  Protocols used: NO GUIDELINE AVAILABLE-A-AH

## 2020-06-10 MED FILL — AMLODIPINE BESYLATE 10 MG T: 10 | 30 days supply | Qty: 30 | Fill #3

## 2020-06-10 MED FILL — METOPROLOL TARTRATE 25 MG T: 25 | 30 days supply | Qty: 60 | Fill #1

## 2020-07-08 MED FILL — AMLODIPINE BESYLATE 10 MG T: 10 | 30 days supply | Qty: 30 | Fill #4

## 2020-07-08 MED FILL — METOPROLOL TARTRATE 25 MG T: 25 | 30 days supply | Qty: 60 | Fill #2

## 2020-08-09 IMAGING — CT CT HEAD WITHOUT CONTRAST
3 series · 14 of 47 positions shown, 16 images · non-contrast
Comparison: 09/26/2008

CLINICAL DATA: Acute metabolic encephalopathy.

EXAM:
CT HEAD WITHOUT CONTRAST
TECHNIQUE: Contiguous axial images were obtained from the base of the skull
through the vertex without intravenous contrast.

[Series 2: head wo · axial · 0.47mm/px · z∈[+1635,+1765]mm · 8 of 32 slices shown, 10 images]
[im 3/32  brain]
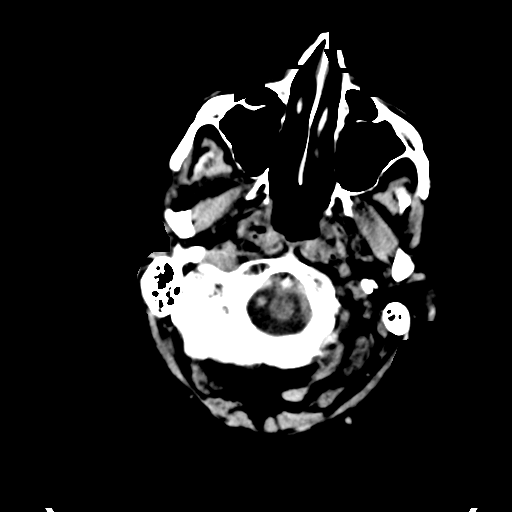
[im 3/32  bone]
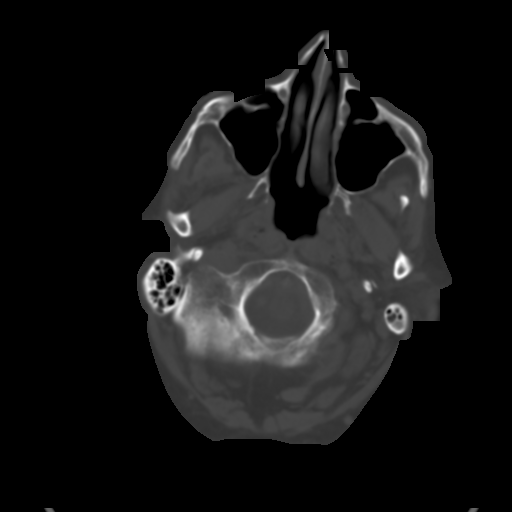
[im 7/32  brain]
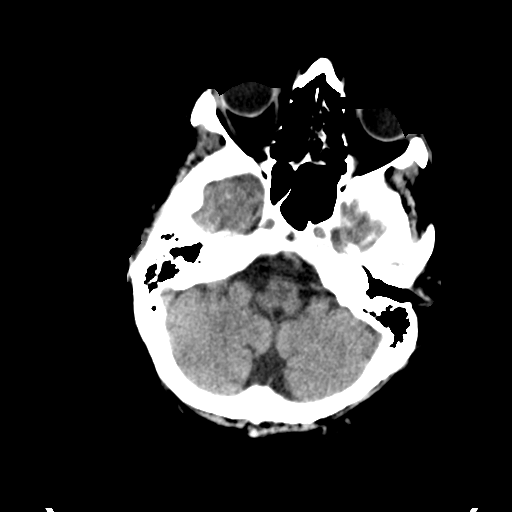
[im 10/32  brain]
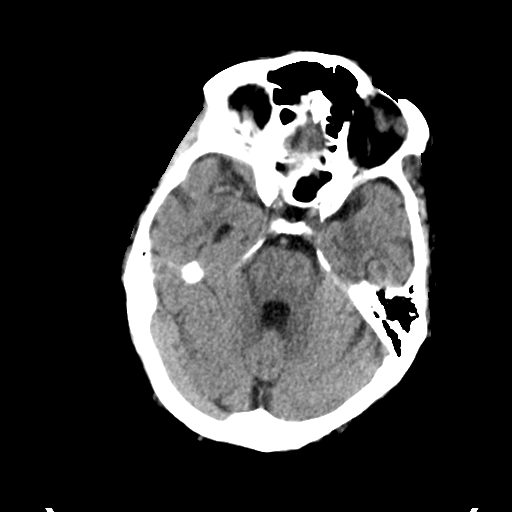
[im 14/32  brain]
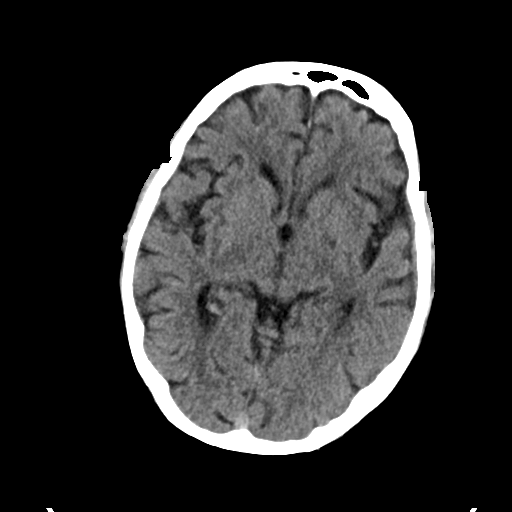
[im 18/32  brain]
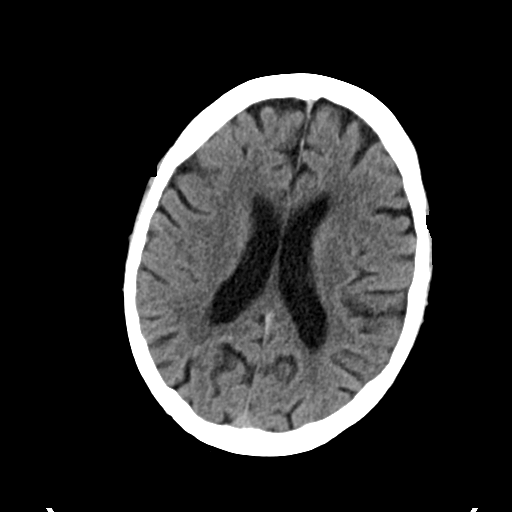
[im 18/32  bone]
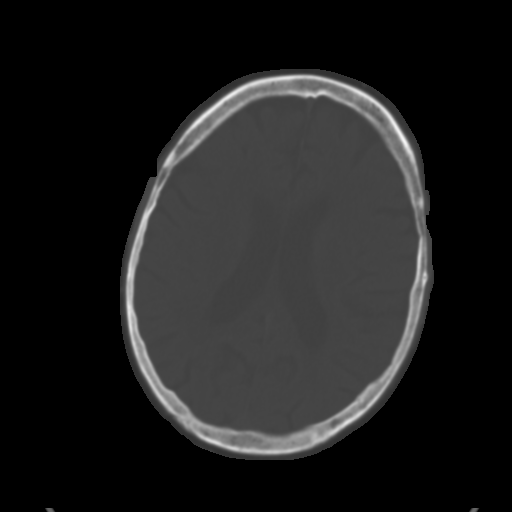
[im 22/32  brain]
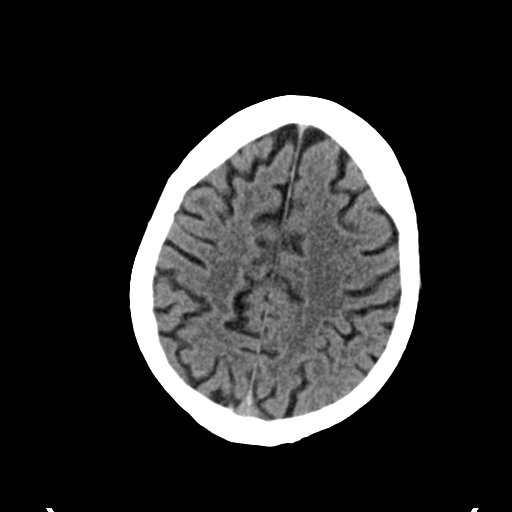
[im 25/32  brain]
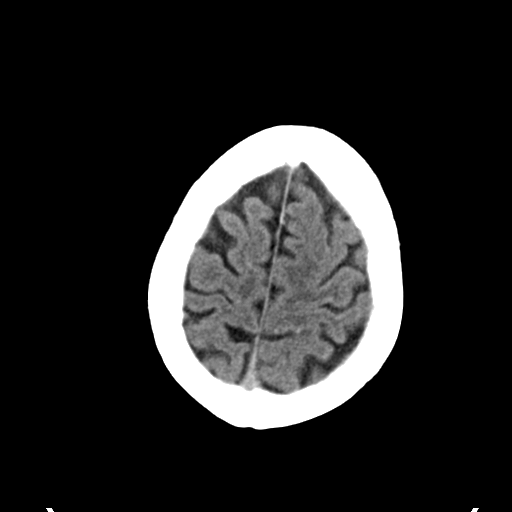
[im 29/32  brain]
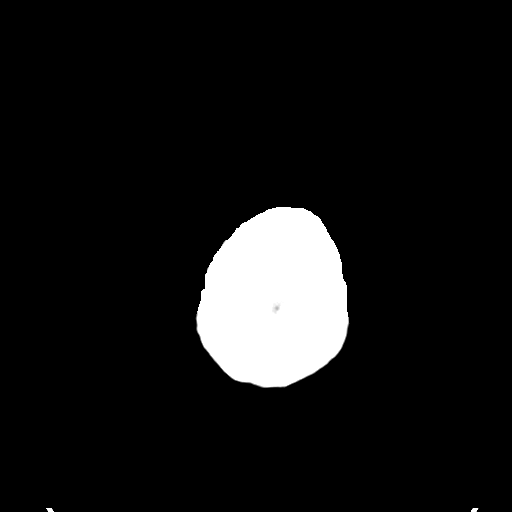

[Series 4: coronal soft tissue · coronal · 0.30mm/px · 3 of 72 slices shown]
[im 24/72  brain]
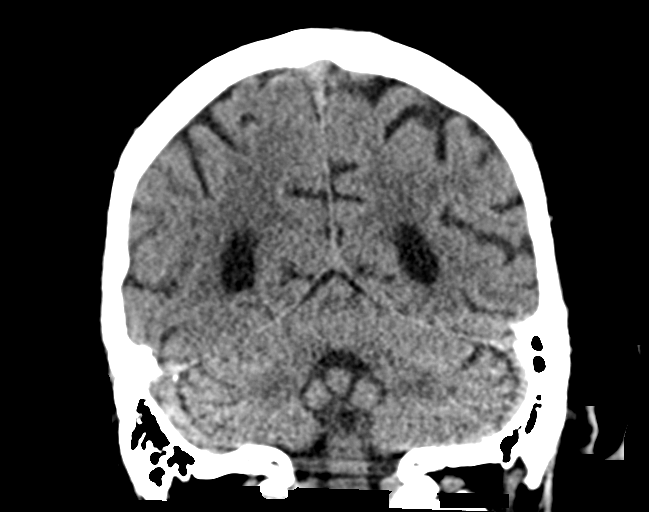
[im 32/72  brain]
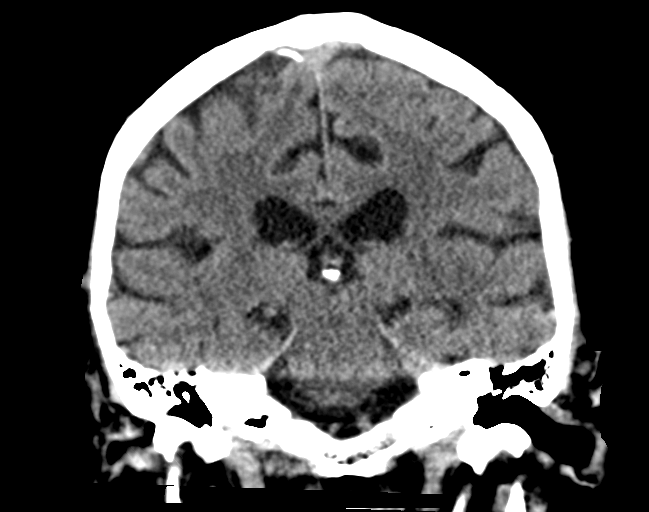
[im 40/72  brain]
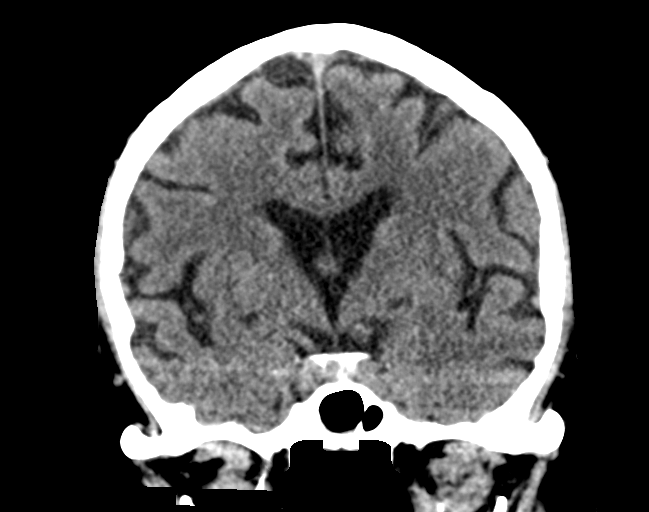

[Series 5: sagittal soft tissue · sagittal · 0.31mm/px · 3 of 67 slices shown]
[im 23/67  brain]
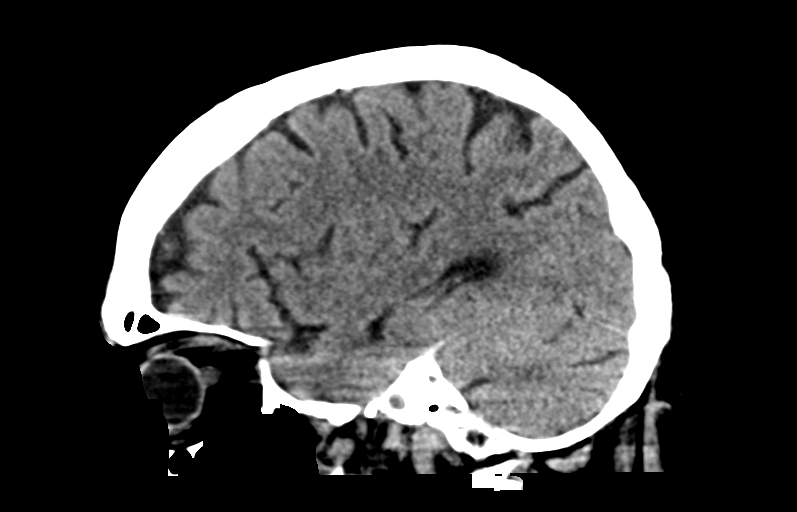
[im 34/67  brain]
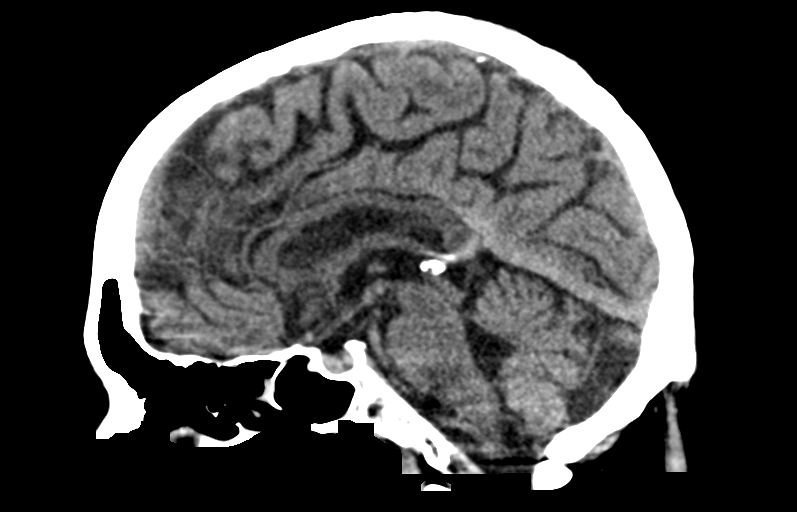
[im 45/67  brain]
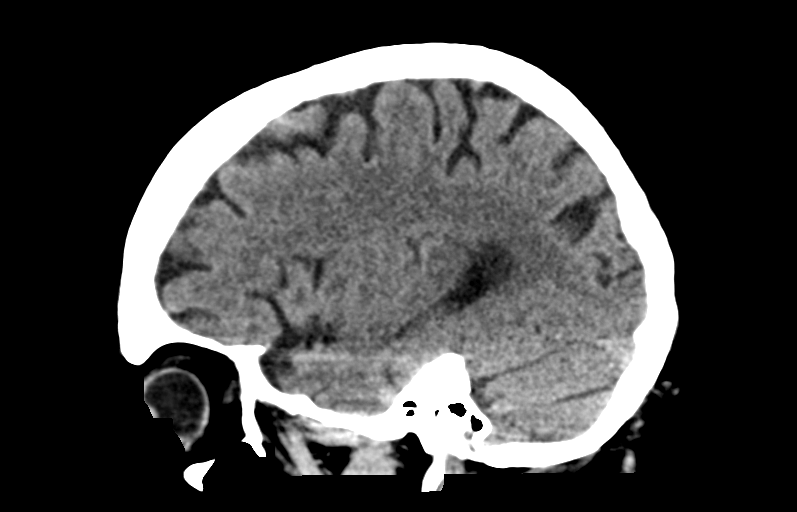

[14 of 47 positions shown; findings below may reference images not displayed]

FINDINGS: Brain: No evidence of acute infarction, hemorrhage, hydrocephalus,
extra-axial collection or mass lesion/mass effect. Lacunar infarct
in the bilateral thalamus that are discrete and chronic appearing on
coronal reformats.

Vascular: No hyperdense vessel or unexpected calcification.

Skull: Normal. Negative for fracture or focal lesion.

Sinuses/Orbits: No acute finding.
IMPRESSION: 1. No acute finding.
2. Chronic appearing lacunar infarcts in the thalami.

## 2020-08-09 MED FILL — ?METOPROLOL TART 25MG TABLE: 25 | 30 days supply | Qty: 60 | Fill #3

## 2020-08-09 MED FILL — AMLODIPINE BESYLATE 10 MG T: 10 | 30 days supply | Qty: 30 | Fill #0

## 2020-09-06 MED FILL — ?METOPROLOL TART 25MG TABLE: 25 | 30 days supply | Qty: 60 | Fill #4

## 2020-09-06 MED FILL — AMLODIPINE BESYLATE 10 MG T: 10 | 30 days supply | Qty: 30 | Fill #1

## 2020-10-06 ENCOUNTER — Other Ambulatory Visit: Payer: Self-pay | Admitting: Family Medicine

## 2020-10-06 DIAGNOSIS — I4891 Unspecified atrial fibrillation: Secondary | ICD-10-CM

## 2020-10-06 MED ORDER — METOPROLOL TARTRATE 25 MG PO TABS: 25.0000 mg | ORAL_TABLET | Freq: Two times a day (BID) | ORAL | 0 refills | Status: DC

## 2020-10-06 MED FILL — AMLODIPINE BESYLATE 10 MG T: 10 | 30 days supply | Qty: 30 | Fill #2

## 2020-10-06 MED FILL — ?METOPROLOL TART 25MG TABLE: 25 | 30 days supply | Qty: 60 | Fill #0

## 2020-10-06 NOTE — Telephone Encounter (Signed)
Medication Refill - Medication: metoprolol tartrate (LOPRESSOR) 25 MG tablet amLODipine (NORVASC) 10 MG tablet     Preferred Pharmacy (with phone number or street name): [facility] - [place], [place] - [address] Phone:  [phone]  Fax:  [phone]       Agent: Please be advised that RX refills may take up to 3 business days. We ask that you follow-up with your pharmacy.

## 2020-11-10 MED FILL — AMLODIPINE BESYLATE 10 MG T: 10 | 30 days supply | Qty: 30 | Fill #3

## 2020-11-10 MED FILL — ?METOPROLOL TARTRATE 25MG T: 25 | 30 days supply | Qty: 60 | Fill #1

## 2020-11-17 ENCOUNTER — Ambulatory Visit: Payer: Self-pay | Admitting: Family Medicine

## 2020-12-01 ENCOUNTER — Other Ambulatory Visit: Payer: Self-pay | Admitting: Nurse Practitioner

## 2020-12-01 ENCOUNTER — Encounter: Payer: Self-pay | Admitting: Nurse Practitioner

## 2020-12-01 ENCOUNTER — Ambulatory Visit: Payer: Self-pay | Attending: Nurse Practitioner | Admitting: Nurse Practitioner

## 2020-12-01 ENCOUNTER — Other Ambulatory Visit: Payer: Self-pay

## 2020-12-01 VITALS — BP 108/70 | HR 60 | Temp 98.7°F | Ht 67.0 in | Wt 167.0 lb

## 2020-12-01 DIAGNOSIS — I4891 Unspecified atrial fibrillation: Secondary | ICD-10-CM

## 2020-12-01 DIAGNOSIS — R7303 Prediabetes: Secondary | ICD-10-CM

## 2020-12-01 DIAGNOSIS — Z1159 Encounter for screening for other viral diseases: Secondary | ICD-10-CM

## 2020-12-01 DIAGNOSIS — I10 Essential (primary) hypertension: Secondary | ICD-10-CM

## 2020-12-01 DIAGNOSIS — Z1211 Encounter for screening for malignant neoplasm of colon: Secondary | ICD-10-CM

## 2020-12-01 DIAGNOSIS — Z7982 Long term (current) use of aspirin: Secondary | ICD-10-CM

## 2020-12-01 DIAGNOSIS — Z130 Encounter for screening for diseases of the blood and blood-forming organs and certain disorders involving the immune mechanism: Secondary | ICD-10-CM

## 2020-12-01 DIAGNOSIS — I2699 Other pulmonary embolism without acute cor pulmonale: Secondary | ICD-10-CM

## 2020-12-01 LAB — GLUCOSE, POCT (MANUAL RESULT ENTRY): POC Glucose: 147 mg/dl — AB (ref 70–99)

## 2020-12-01 LAB — POCT GLYCOSYLATED HEMOGLOBIN (HGB A1C): HbA1c, POC (prediabetic range): 5.6 % — AB (ref 5.7–6.4)

## 2020-12-01 MED ORDER — ACETAMINOPHEN 325 MG PO TABS: 650.0000 mg | ORAL_TABLET | ORAL | 0 refills | Status: DC | PRN

## 2020-12-01 MED ORDER — METOPROLOL TARTRATE 25 MG PO TABS
25.0000 mg | ORAL_TABLET | Freq: Two times a day (BID) | ORAL | 0 refills | Status: DC
  Filled 2021-02-17: qty 60, 30d supply, fill #0

## 2020-12-01 MED ORDER — AMLODIPINE BESYLATE 10 MG PO TABS: 10.0000 mg | ORAL_TABLET | Freq: Every day | ORAL | 1 refills | Status: DC

## 2020-12-01 MED ORDER — ASPIRIN 81 MG PO TBEC: 81.0000 mg | DELAYED_RELEASE_TABLET | Freq: Every day | ORAL | 1 refills | Status: DC

## 2020-12-01 MED ORDER — PANTOPRAZOLE SODIUM 40 MG PO TBEC: 40.0000 mg | DELAYED_RELEASE_TABLET | Freq: Every day | ORAL | 1 refills | Status: DC

## 2020-12-01 NOTE — Progress Notes (Signed)
Assessment & Plan:  Welcome was seen today for establish care.  Diagnoses and all orders for this visit:  Atrial fibrillation, unspecified type ([place]) -     metoprolol tartrate (LOPRESSOR) 25 MG tablet; Take 1 tablet (25 mg total) by mouth 2 (two) times daily. -     acetaminophen (TYLENOL) 325 MG tablet; Take 2 tablets (650 mg total) by mouth every 4 (four) hours as needed for mild pain (or temp > 37.5 C (99.5 F)). -     aspirin 81 MG EC tablet; Take 1 tablet (81 mg total) by mouth daily.  Essential hypertension -     amLODipine (NORVASC) 10 MG tablet; Take 1 tablet (10 mg total) by mouth daily. To control blood pressure  Pre-diabetes -     HgB A1c -     Glucose (CBG) -     CMP14+EGFR  Colon cancer screening -     Fecal occult blood, imunochemical([facility])  Need for hepatitis C screening test -     HCV Ab w Reflex to Quant PCR  Screening for deficiency anemia -     CBC  Long-term use of aspirin therapy -     pantoprazole (PROTONIX) 40 MG tablet; Take 1 tablet (40 mg total) by mouth daily. -     CBC  Acute pulmonary embolism without acute cor pulmonale, unspecified pulmonary embolism type (HCC) -     aspirin 81 MG EC tablet; Take 1 tablet (81 mg total) by mouth daily.    Patient has been counseled on age-appropriate routine health concerns for screening and prevention. These are reviewed and up-to-date. Referrals have been placed accordingly. Immunizations are up-to-date or declined.    Subjective:   Chief Complaint  Patient presents with  . Establish Care    Patient is here to re-establish care.    HPI Cody Porter 60 y.o. male presents to office today to establish care. PMH of  ETOH abuse (01/28/2019), PAF, B/L Leg DVT (01/28/2019), IVC filter,  Pulmonary embolus ([place]) (01/28/2019), ICH (after being place don Eliquis for PE/DVT. Currently on ASA therapy   His mother whom he lives with drove him to his appointment today. He has not been driving since his ICH.   Continues to smoke cigarettes. Down to 1ppd from 1-2 ppd.   Essential Hypertension Well controlled. He is taking metoprolol 25 mg BID and amlodipine 10 mg daily. Denies chest pain, shortness of breath, palpitations, lightheadedness, dizziness, headaches or BLE edema.  BP Readings from Last 3 Encounters:  12/01/20 108/70  05/13/20 (!) 133/92  10/15/19 122/78    Prediabetes Well controlled with diet only at this time Lab Results  Component Value Date   HGBA1C 5.6 (A) 12/01/2020    Review of Systems  Constitutional: Negative for fever, malaise/fatigue and weight loss.  HENT: Negative.  Negative for nosebleeds.   Eyes: Negative.  Negative for blurred vision, double vision and photophobia.  Respiratory: Negative.  Negative for cough and shortness of breath.   Cardiovascular: Negative.  Negative for chest pain, palpitations and leg swelling.  Gastrointestinal: Negative.  Negative for heartburn, nausea and vomiting.  Musculoskeletal: Negative.  Negative for myalgias.  Neurological: Negative.  Negative for dizziness, focal weakness, seizures and headaches.  Psychiatric/Behavioral: Negative.  Negative for suicidal ideas.    Past Medical History:  Diagnosis Date  . ETOH abuse 01/28/2019  . Leg DVT (deep venous thromboembolism), acute, bilateral ([place]) 01/28/2019  . Pulmonary embolus ([place]) 01/28/2019    Past Surgical History:  Procedure  Laterality Date  . HERNIA REPAIR    . IR IVC FILTER PLMT / S&I /IMG GUID/MOD SED  02/25/2019  . IR RADIOLOGIST EVAL & MGMT  07/17/2019  . TONSILLECTOMY      Family History  Problem Relation Age of Onset  . COPD Mother   . Cancer Mother        Breast x2  . Anuerysm Father     Social History Reviewed with no changes to be made today.   Outpatient Medications Prior to Visit  Medication Sig Dispense Refill  . acetaminophen (TYLENOL) 325 MG tablet Take 2 tablets (650 mg total) by mouth every 4 (four) hours as needed for mild pain (or temp > 37.5 C  (99.5 F)).    Marland Kitchen amLODipine (NORVASC) 10 MG tablet Take 1 tablet (10 mg total) by mouth daily. To control blood pressure 90 tablet 1  . aspirin 81 MG EC tablet Take 1 tablet (81 mg total) by mouth daily. 90 tablet 1  . metoprolol tartrate (LOPRESSOR) 25 MG tablet Take 1 tablet (25 mg total) by mouth 2 (two) times daily. 180 tablet 0  . pantoprazole (PROTONIX) 40 MG tablet Take 1 tablet (40 mg total) by mouth daily. 90 tablet 1   No facility-administered medications prior to visit.    No Known Allergies     Objective:    BP 108/70 (BP Location: Left Arm, Patient Position: Sitting, Cuff Size: Normal)   Pulse 60   Temp 98.7 F (37.1 C) (Oral)   Ht $R'5\' 7"'ML$  (1.702 m)   Wt 167 lb (75.8 kg)   SpO2 98%   BMI 26.16 kg/m  Wt Readings from Last 3 Encounters:  12/01/20 167 lb (75.8 kg)  05/13/20 175 lb 6.4 oz (79.6 kg)  10/15/19 177 lb (80.3 kg)    Physical Exam Vitals and nursing note reviewed.  Constitutional:      Appearance: He is well-developed and well-nourished.  HENT:     Head: Normocephalic and atraumatic.  Eyes:     Extraocular Movements: EOM normal.  Cardiovascular:     Rate and Rhythm: Normal rate and regular rhythm.     Pulses: Intact distal pulses.     Heart sounds: Normal heart sounds. No murmur heard. No friction rub. No gallop.   Pulmonary:     Effort: Pulmonary effort is normal. No tachypnea or respiratory distress.     Breath sounds: Normal breath sounds. No decreased breath sounds, wheezing, rhonchi or rales.  Chest:     Chest wall: No tenderness.  Abdominal:     General: Bowel sounds are normal.     Palpations: Abdomen is soft.  Musculoskeletal:        General: No edema. Normal range of motion.     Cervical back: Normal range of motion.  Skin:    General: Skin is warm and dry.  Neurological:     Mental Status: He is alert and oriented to person, place, and time.     Coordination: Coordination normal.  Psychiatric:        Mood and Affect: Mood and  affect normal.        Behavior: Behavior normal. Behavior is cooperative.        Thought Content: Thought content normal.        Judgment: Judgment normal.          Patient has been counseled extensively about nutrition and exercise as well as the importance of adherence with medications and regular follow-up. The patient was  given clear instructions to go to ER or return to medical center if symptoms don't improve, worsen or new problems develop. The patient verbalized understanding.   Follow-up: Return in about 3 months (around 03/01/2021).   Gildardo Pounds, FNP-BC [facility], [place]   12/01/2020, 9:10 PM

## 2020-12-02 LAB — CMP14+EGFR
ALT: 10 IU/L (ref 0–44)
AST: 18 IU/L (ref 0–40)
Albumin/Globulin Ratio: 1.8 (ref 1.2–2.2)
Albumin: 4.8 g/dL (ref 3.8–4.9)
Alkaline Phosphatase: 64 IU/L (ref 44–121)
BUN/Creatinine Ratio: 11 (ref 10–24)
BUN: 14 mg/dL (ref 8–27)
Bilirubin Total: 0.3 mg/dL (ref 0.0–1.2)
CO2: 22 mmol/L (ref 20–29)
Calcium: 9.7 mg/dL (ref 8.6–10.2)
Chloride: 109 mmol/L — ABNORMAL HIGH (ref 96–106)
Creatinine, Ser: 1.26 mg/dL (ref 0.76–1.27)
GFR calc Af Amer: 71 mL/min/{1.73_m2} (ref >59)
GFR calc non Af Amer: 62 mL/min/{1.73_m2} (ref >59)
Globulin, Total: 2.7 g/dL (ref 1.5–4.5)
Glucose: 86 mg/dL (ref 65–99)
Potassium: 4.9 mmol/L (ref 3.5–5.2)
Sodium: 147 mmol/L — ABNORMAL HIGH (ref 134–144)
Total Protein: 7.5 g/dL (ref 6.0–8.5)

## 2020-12-02 LAB — HCV INTERPRETATION

## 2020-12-02 LAB — CBC
Hematocrit: 43.2 % (ref 37.5–51.0)
Hemoglobin: 14.6 g/dL (ref 13.0–17.7)
MCH: 29.9 pg (ref 26.6–33.0)
MCHC: 33.8 g/dL (ref 31.5–35.7)
MCV: 88 fL (ref 79–97)
Platelets: 352 10*3/uL (ref 150–450)
RBC: 4.89 x10E6/uL (ref 4.14–5.80)
RDW: 12.5 % (ref 11.6–15.4)
WBC: 6.9 10*3/uL (ref 3.4–10.8)

## 2020-12-02 LAB — HCV AB W REFLEX TO QUANT PCR: HCV Ab: <0.1 s/co ratio (ref 0.0–0.9)

## 2020-12-03 ENCOUNTER — Telehealth: Payer: Self-pay | Admitting: Nurse Practitioner

## 2020-12-03 NOTE — Telephone Encounter (Signed)
Pt received his 1st  moderna vaccine on 04-19-2020, 2nd dose on 05-17-2020 and moderna booster on 11-20-2020

## 2020-12-10 MED FILL — ?METOPROLOL TARTRATE 25MG T: 25 | 30 days supply | Qty: 60 | Fill #2

## 2020-12-10 MED FILL — AMLODIPINE BESYLATE 10 MG T: 10 | 30 days supply | Qty: 30 | Fill #4

## 2020-12-17 ENCOUNTER — Telehealth: Payer: Self-pay | Admitting: Nurse Practitioner

## 2020-12-17 NOTE — Telephone Encounter (Signed)
Copied from CRM [id number]. Topic: General - Other >> Dec 17, 2020 10:04 AM Jaquita Rector A wrote: Reason for CRM: Patient mom [name] called in to say that they have done the collection for the stool sample for the colonoscopy but did not receive the order form from the office so state that they need that to send in the samples. Asking for the form so they can send the sample to the lab. Ph# [phone] if no answer please leave a message.

## 2020-12-21 NOTE — Telephone Encounter (Signed)
Attempt to reach patient. No answer and LVM.  Patient was supposed to take the stool kit home and then drop off the kit to the clinic same day when he do the kit.   

## 2021-01-19 MED FILL — ?METOPROLOL TARTRATE 25MG T: 25 | 30 days supply | Qty: 60 | Fill #0

## 2021-01-19 MED FILL — AMLODIPINE BESYLATE 10 MG T: 10 | 30 days supply | Qty: 30 | Fill #5

## 2021-02-12 ENCOUNTER — Other Ambulatory Visit: Payer: Self-pay

## 2021-02-17 ENCOUNTER — Other Ambulatory Visit: Payer: Self-pay

## 2021-02-17 MED FILL — Amlodipine Besylate Tab 10 MG (Base Equivalent): ORAL | 30 days supply | Qty: 30 | Fill #0 | Status: AC

## 2021-02-18 ENCOUNTER — Other Ambulatory Visit: Payer: Self-pay

## 2021-03-01 ENCOUNTER — Other Ambulatory Visit: Payer: Self-pay

## 2021-03-01 ENCOUNTER — Encounter: Payer: Self-pay | Admitting: Nurse Practitioner

## 2021-03-01 ENCOUNTER — Ambulatory Visit: Payer: Self-pay | Attending: Nurse Practitioner | Admitting: Nurse Practitioner

## 2021-03-01 DIAGNOSIS — Z7982 Long term (current) use of aspirin: Secondary | ICD-10-CM

## 2021-03-01 DIAGNOSIS — I10 Essential (primary) hypertension: Secondary | ICD-10-CM

## 2021-03-01 DIAGNOSIS — I2699 Other pulmonary embolism without acute cor pulmonale: Secondary | ICD-10-CM

## 2021-03-01 DIAGNOSIS — I4891 Unspecified atrial fibrillation: Secondary | ICD-10-CM

## 2021-03-01 DIAGNOSIS — E785 Hyperlipidemia, unspecified: Secondary | ICD-10-CM

## 2021-03-01 MED ORDER — AMLODIPINE BESYLATE 10 MG PO TABS
ORAL_TABLET | ORAL | 1 refills | Status: DC
  Filled 2021-03-01: qty 90, fill #0
  Filled 2021-03-04: qty 30, 30d supply, fill #0
  Filled 2021-04-18: qty 30, 30d supply, fill #1
  Filled 2021-05-26: qty 30, 30d supply, fill #2

## 2021-03-01 MED ORDER — PANTOPRAZOLE SODIUM 40 MG PO TBEC
DELAYED_RELEASE_TABLET | Freq: Every day | ORAL | 1 refills | Status: DC
  Filled 2021-03-01: qty 30, 30d supply, fill #0

## 2021-03-01 MED ORDER — METOPROLOL TARTRATE 25 MG PO TABS
25.0000 mg | ORAL_TABLET | Freq: Two times a day (BID) | ORAL | 0 refills | Status: DC
  Filled 2021-03-01: qty 60, 30d supply, fill #0
  Filled 2021-04-18: qty 60, 30d supply, fill #1
  Filled 2021-05-26: qty 60, 30d supply, fill #2

## 2021-03-01 NOTE — Progress Notes (Signed)
Virtual Visit via Telephone Note Due to national recommendations of social distancing due to COVID 19, telehealth visit is felt to be most appropriate for this patient at this time.  I discussed the limitations, risks, security and privacy concerns of performing an evaluation and management service by telephone and the availability of in person appointments. I also discussed with the patient that there may be a patient responsible charge related to this service. The patient expressed understanding and agreed to proceed.    I connected with Cody Porter on 03/01/21  at   2:10 PM EDT  EDT by telephone and verified that I am speaking with the correct person using two identifiers.   Consent I discussed the limitations, risks, security and privacy concerns of performing an evaluation and management service by telephone and the availability of in person appointments. I also discussed with the patient that there may be a patient responsible charge related to this service. The patient expressed understanding and agreed to proceed.   Location of Patient: Private Residence    Location of Provider: [facility]    Persons participating in Telemedicine visit: Bertram Denver FNP-BC [place] CMA Jawaan Carlynn Herald    History of Present Illness: Telemedicine visit for:  Establish care He has a past medical history of ETOH abuse (01/28/2019), Prediabetes, Leg DVT, acute, bilateral  (01/28/2019), Nontraumatic B/L ICH, and Pulmonary embolus  (01/28/2019). 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  Patient's Eliquis was discontinued and he was placed on 81 mg aspirin for anticoagulation due to paroxysmal atrial fibrillation. No other anticoagulation due to hemorrhage. IVC filter  placed 02-25-2019. He never followed up with Neurology after discharge from hospital and rehab.   He currently lives with his 50 yr old mother. It appears he is dependent on her for transportation and food and shelter.   Prediabetes Well controlled with diet only. LDL not at goal. Will add statin.  Lab Results  Component Value Date   HGBA1C 5.6 (A) 12/01/2020   Lab Results  Component Value Date   LDLCALC 117 (H) 05/13/2020  The 10-year ASCVD risk score Denman George DC Jr., et al., 2013) is: 13%   Values used to calculate the score:     Age: 60 years     Sex: Male     Is Non-Hispanic African American: No     Diabetic: No     Tobacco smoker: Yes     Systolic Blood Pressure: 108 mmHg     Is BP treated: Yes     HDL Cholesterol: 47 mg/dL     Total Cholesterol: 198 mg/dL   Essential Hypertension Well controlled with amlodipine 10 g daily and lopressor 25 mg BID. Denies chest pain, shortness of breath, palpitations, lightheadedness, dizziness, headaches or BLE edema.  BP Readings from Last 3 Encounters:  12/01/20 108/70  05/13/20 (!) 133/92  10/15/19 122/78      Past Medical History:  Diagnosis Date  . ETOH abuse 01/28/2019  . Leg DVT (deep venous thromboembolism), acute, bilateral (HCC) 01/28/2019  . Pulmonary embolus (HCC) 01/28/2019    Past Surgical History:  Procedure Laterality Date  . HERNIA REPAIR    . IR IVC FILTER PLMT / S&I /IMG GUID/MOD SED  02/25/2019  . IR RADIOLOGIST EVAL & MGMT  07/17/2019  . TONSILLECTOMY  Family History  Problem Relation Age of Onset  . COPD Mother   . Cancer Mother        Breast x2  . Anuerysm Father     Social History   Socioeconomic History  . Marital status: Single    Spouse name: Not on file  . Number of children: Not on file  . Years of education: Not on file  . Highest education level: Not on file  Occupational History  . Not on file  Tobacco Use  . Smoking status: Current Every Day Smoker    Packs/day: 0.75    Types:  Cigarettes  . Smokeless tobacco: Never Used  Vaping Use  . Vaping Use: Never used  Substance and Sexual Activity  . Alcohol use: Not Currently    Alcohol/week: 14.0 standard drinks    Types: 14 Cans of beer per week    Comment: Patient states no alcohol x 1 month  . Drug use: Not Currently    Types: Marijuana  . Sexual activity: Not on file  Other Topics Concern  . Not on file  Social History Narrative  . Not on file   Social Determinants of Health   Financial Resource Strain: Not on file  Food Insecurity: Not on file  Transportation Needs: Not on file  Physical Activity: Not on file  Stress: Not on file  Social Connections: Not on file     Observations/Objective: Awake, alert and oriented x 3   Review of Systems  Constitutional: Negative for fever, malaise/fatigue and weight loss.  HENT: Negative.  Negative for nosebleeds.   Eyes: Negative.  Negative for blurred vision, double vision and photophobia.  Respiratory: Negative.  Negative for cough and shortness of breath.   Cardiovascular: Negative.  Negative for chest pain, palpitations and leg swelling.  Gastrointestinal: Negative.  Negative for heartburn, nausea and vomiting.  Musculoskeletal: Negative.  Negative for myalgias.  Neurological: Negative.  Negative for dizziness, focal weakness, seizures, loss of consciousness, weakness and headaches.  Psychiatric/Behavioral: Negative.  Negative for suicidal ideas.    Assessment and Plan: Diagnoses and all orders for this visit:  Dyslipidemia, goal LDL below 100 -     atorvastatin (LIPITOR) 40 MG tablet; Take 1 tablet (40 mg total) by mouth daily. INSTRUCTIONS: Work on a low fat, heart healthy diet and participate in regular aerobic exercise program by working out at least 150 minutes per week; 5 days a week-30 minutes per day. Avoid red meat/beef/steak,  fried foods. junk foods, sodas, sugary drinks, unhealthy snacking, alcohol and smoking.  Drink at least 80 oz of water  per day and monitor your carbohydrate intake daily.    Essential hypertension -     amLODipine (NORVASC) 10 MG tablet; TAKE 1 TABLET (10 MG TOTAL) BY MOUTH DAILY. TO CONTROL BLOOD PRESSURE Continue all antihypertensives as prescribed.  Remember to bring in your blood pressure log with you for your follow up appointment.  DASH/Mediterranean Diets are healthier choices for HTN.   Atrial fibrillation, unspecified type (HCC) -     metoprolol tartrate (LOPRESSOR) 25 MG tablet; Take 1 tablet (25 mg total) by mouth 2 (two) times daily. -     aspirin 81 MG EC tablet; TAKE 1 TABLET (81 MG TOTAL) BY MOUTH DAILY.  Long-term use of aspirin therapy -     pantoprazole (PROTONIX) 40 MG tablet; TAKE 1 TABLET (40 MG TOTAL) BY MOUTH DAILY.  Acute pulmonary embolism without acute cor pulmonale, unspecified pulmonary embolism type (HCC) -  aspirin 81 MG EC tablet; TAKE 1 TABLET (81 MG TOTAL) BY MOUTH DAILY.     Follow Up Instructions Return in about 3 months (around 05/31/2021).     I discussed the assessment and treatment plan with the patient. The patient was provided an opportunity to ask questions and all were answered. The patient agreed with the plan and demonstrated an understanding of the instructions.   The patient was advised to call back or seek an in-person evaluation if the symptoms worsen or if the condition fails to improve as anticipated.  I provided 17 minutes of non-face-to-face time during this encounter including median intraservice time, reviewing previous notes, labs, imaging, medications and explaining diagnosis and management.  Claiborne Rigg, FNP-BC

## 2021-03-03 ENCOUNTER — Encounter: Payer: Self-pay | Admitting: Nurse Practitioner

## 2021-03-03 MED ORDER — ATORVASTATIN CALCIUM 40 MG PO TABS
40.0000 mg | ORAL_TABLET | Freq: Every day | ORAL | 3 refills | Status: DC
Start: 2021-03-03 — End: 2022-03-30
  Filled 2021-03-03 – 2021-12-15 (×2): qty 30, 30d supply, fill #0
  Filled 2022-01-18: qty 30, 30d supply, fill #1
  Filled 2022-02-27: qty 30, 30d supply, fill #2

## 2021-03-03 MED ORDER — ASPIRIN 81 MG PO TBEC
DELAYED_RELEASE_TABLET | Freq: Every day | ORAL | 3 refills | Status: AC
  Filled 2021-03-03: qty 90, fill #0

## 2021-03-04 ENCOUNTER — Other Ambulatory Visit: Payer: Self-pay

## 2021-04-18 ENCOUNTER — Other Ambulatory Visit: Payer: Self-pay

## 2021-04-19 ENCOUNTER — Other Ambulatory Visit: Payer: Self-pay

## 2021-05-26 ENCOUNTER — Other Ambulatory Visit: Payer: Self-pay

## 2021-05-27 ENCOUNTER — Other Ambulatory Visit: Payer: Self-pay

## 2021-06-03 ENCOUNTER — Telehealth: Payer: Self-pay

## 2021-06-03 ENCOUNTER — Ambulatory Visit: Payer: Self-pay | Admitting: Nurse Practitioner

## 2021-06-03 NOTE — Telephone Encounter (Signed)
Contacted pt to reschedule his appt with Zelda pt didn't answer. If pt calls back please reschedule

## 2021-06-22 ENCOUNTER — Other Ambulatory Visit: Payer: Self-pay | Admitting: Nurse Practitioner

## 2021-06-22 ENCOUNTER — Other Ambulatory Visit: Payer: Self-pay

## 2021-06-22 DIAGNOSIS — I10 Essential (primary) hypertension: Secondary | ICD-10-CM

## 2021-06-22 DIAGNOSIS — I4891 Unspecified atrial fibrillation: Secondary | ICD-10-CM

## 2021-06-22 MED ORDER — METOPROLOL TARTRATE 25 MG PO TABS
25.0000 mg | ORAL_TABLET | Freq: Two times a day (BID) | ORAL | 0 refills | Status: DC
  Filled 2021-06-22: qty 60, 30d supply, fill #0
  Filled 2021-07-20: qty 60, 30d supply, fill #1

## 2021-06-22 MED ORDER — AMLODIPINE BESYLATE 10 MG PO TABS
ORAL_TABLET | ORAL | 0 refills | Status: DC
  Filled 2021-06-22: qty 30, 30d supply, fill #0
  Filled 2021-07-20: qty 30, 30d supply, fill #1

## 2021-06-22 NOTE — Telephone Encounter (Signed)
Medication Refill - Medication: metoprolol tartrate (LOPRESSOR) 25 MG tablet and amLODipine (NORVASC) 10 MG tablet  Has the patient contacted their pharmacy? No. Patient will pharmacy next time    Preferred Pharmacy (with phone number or street name):  [facility] Phone:  [phone]  Fax:  [phone]      Agent: Please be advised that RX refills may take up to 3 business days. We ask that you follow-up with your pharmacy.  Patient was last seen by their PCP on 12/01/2020. Patient has a future appointment scheduled for 07/20/2021

## 2021-07-20 ENCOUNTER — Encounter: Payer: Self-pay | Admitting: Nurse Practitioner

## 2021-07-20 ENCOUNTER — Other Ambulatory Visit: Payer: Self-pay

## 2021-07-20 ENCOUNTER — Ambulatory Visit: Payer: Self-pay | Attending: Nurse Practitioner | Admitting: Nurse Practitioner

## 2021-07-20 VITALS — BP 130/81 | HR 73 | Ht 67.0 in | Wt 179.1 lb

## 2021-07-20 DIAGNOSIS — Z95828 Presence of other vascular implants and grafts: Secondary | ICD-10-CM

## 2021-07-20 DIAGNOSIS — I10 Essential (primary) hypertension: Secondary | ICD-10-CM

## 2021-07-20 DIAGNOSIS — E785 Hyperlipidemia, unspecified: Secondary | ICD-10-CM

## 2021-07-20 DIAGNOSIS — I4891 Unspecified atrial fibrillation: Secondary | ICD-10-CM

## 2021-07-20 DIAGNOSIS — Z7982 Long term (current) use of aspirin: Secondary | ICD-10-CM

## 2021-07-20 MED ORDER — AMLODIPINE BESYLATE 10 MG PO TABS
ORAL_TABLET | ORAL | 1 refills | Status: DC
  Filled 2021-08-29: qty 30, 30d supply, fill #0
  Filled 2021-09-30: qty 30, 30d supply, fill #1
  Filled 2021-11-10: qty 30, 30d supply, fill #2
  Filled 2021-12-14: qty 30, 30d supply, fill #0
  Filled 2022-01-18: qty 30, 30d supply, fill #1
  Filled 2022-02-27: qty 30, 30d supply, fill #2

## 2021-07-20 MED ORDER — ASPIRIN 81 MG PO TBEC: 81.0000 mg | DELAYED_RELEASE_TABLET | Freq: Every day | ORAL | 12 refills | Status: DC

## 2021-07-20 MED ORDER — METOPROLOL TARTRATE 25 MG PO TABS
25.0000 mg | ORAL_TABLET | Freq: Two times a day (BID) | ORAL | 1 refills | Status: DC
  Filled 2021-08-29: qty 60, 30d supply, fill #0
  Filled 2021-09-30: qty 60, 30d supply, fill #1
  Filled 2021-11-10: qty 60, 30d supply, fill #2
  Filled 2021-12-14: qty 60, 30d supply, fill #0
  Filled 2022-01-18: qty 60, 30d supply, fill #1
  Filled 2022-02-27: qty 60, 30d supply, fill #2

## 2021-07-20 NOTE — Progress Notes (Signed)
Assessment & Plan:  Cody Porter was seen today for hypertension.  Diagnoses and all orders for this visit:  Essential hypertension -     amLODipine (NORVASC) 10 MG tablet; TAKE 1 TABLET (10 MG TOTAL) BY MOUTH DAILY. TO CONTROL BLOOD PRESSURE Continue all antihypertensives as prescribed.  Remember to bring in your blood pressure log with you for your follow up appointment.  DASH/Mediterranean Diets are healthier choices for HTN.    Atrial fibrillation, unspecified type (HCC) -     metoprolol tartrate (LOPRESSOR) 25 MG tablet; Take 1 tablet (25 mg total) by mouth 2 (two) times daily. -     aspirin 81 MG EC tablet; Take 1 tablet (81 mg total) by mouth daily. Patients states he has ASA 81 mg at home. Swallow whole.  Long-term use of aspirin therapy -     aspirin 81 MG EC tablet; Take 1 tablet (81 mg total) by mouth daily. Patients states he has ASA 81 mg at home. Swallow whole.  Presence of IVC filter -     Ambulatory referral to Interventional Radiology   Patient has been counseled on age-appropriate routine health concerns for screening and prevention. These are reviewed and up-to-date. Referrals have been placed accordingly. Immunizations are up-to-date or declined.    Subjective:   Chief Complaint  Patient presents with   Hypertension   HPI Cody Porter 60 y.o. male presents to office today for follow up to HTN. He is questioning what to do about his IVC filter. Apparently he did not follow up with IR to have it removed a few years ago (02-25-2019) for PE.   History of PAF, HTN,  ICH, DVT with PE while on eliquis, alcohol and tobacco use. He has never followed up with Neurology or Interventional Radiology.   HTN Controlled. He does continue to smoke. He is taking amlodipine 10 mg daily and lopressor 25 mg BID. Denies chest pain, shortness of breath, palpitations, lightheadedness, dizziness, headaches or BLE edema.   BP Readings from Last 3 Encounters:  07/20/21 130/81  12/01/20  108/70  05/13/20 (!) 133/92    HPL Cholesterol levels not at goal with atorvastatin 40 mg daily.  Lab Results  Component Value Date   LDLCALC 117 (H) 05/13/2020     Review of Systems  Constitutional:  Negative for fever, malaise/fatigue and weight loss.  HENT: Negative.  Negative for nosebleeds.   Eyes: Negative.  Negative for blurred vision, double vision and photophobia.  Respiratory: Negative.  Negative for cough and shortness of breath.   Cardiovascular: Negative.  Negative for chest pain, palpitations and leg swelling.  Gastrointestinal: Negative.  Negative for heartburn, nausea and vomiting.  Musculoskeletal: Negative.  Negative for myalgias.  Neurological: Negative.  Negative for dizziness, focal weakness, seizures and headaches.  Psychiatric/Behavioral: Negative.  Negative for suicidal ideas.    Past Medical History:  Diagnosis Date   ETOH abuse 01/28/2019   Leg DVT (deep venous thromboembolism), acute, bilateral (HCC) 01/28/2019   Pulmonary embolus (HCC) 01/28/2019    Past Surgical History:  Procedure Laterality Date   HERNIA REPAIR     IR IVC FILTER PLMT / S&I /IMG GUID/MOD SED  02/25/2019   IR RADIOLOGIST EVAL & MGMT  07/17/2019   TONSILLECTOMY      Family History  Problem Relation Age of Onset   COPD Mother    Cancer Mother        Breast x2   Anuerysm Father     Social History Reviewed with no  changes to be made today.   Outpatient Medications Prior to Visit  Medication Sig Dispense Refill   atorvastatin (LIPITOR) 40 MG tablet Take 1 tablet (40 mg total) by mouth daily. 90 tablet 3   amLODipine (NORVASC) 10 MG tablet TAKE 1 TABLET (10 MG TOTAL) BY MOUTH DAILY. TO CONTROL BLOOD PRESSURE 90 tablet 0   metoprolol tartrate (LOPRESSOR) 25 MG tablet Take 1 tablet (25 mg total) by mouth 2 (two) times daily. 180 tablet 0   pantoprazole (PROTONIX) 40 MG tablet TAKE 1 TABLET (40 MG TOTAL) BY MOUTH DAILY. (Patient not taking: Reported on 07/20/2021) 90 tablet 1   No  facility-administered medications prior to visit.    No Known Allergies     Objective:    BP 130/81   Pulse 73   Ht 5\' 7"  (1.702 m)   Wt 179 lb 2 oz (81.3 kg)   SpO2 97%   BMI 28.05 kg/m  Wt Readings from Last 3 Encounters:  07/20/21 179 lb 2 oz (81.3 kg)  12/01/20 167 lb (75.8 kg)  05/13/20 175 lb 6.4 oz (79.6 kg)    Physical Exam Vitals and nursing note reviewed.  Constitutional:      Appearance: He is well-developed.  HENT:     Head: Normocephalic and atraumatic.  Cardiovascular:     Rate and Rhythm: Normal rate and regular rhythm.     Heart sounds: Normal heart sounds. No murmur heard.   No friction rub. No gallop.  Pulmonary:     Effort: Pulmonary effort is normal. No tachypnea or respiratory distress.     Breath sounds: Normal breath sounds. No decreased breath sounds, wheezing, rhonchi or rales.  Chest:     Chest wall: No tenderness.  Abdominal:     General: Bowel sounds are normal.     Palpations: Abdomen is soft.  Musculoskeletal:        General: Normal range of motion.     Cervical back: Normal range of motion.  Skin:    General: Skin is warm and dry.  Neurological:     Mental Status: He is alert and oriented to person, place, and time.     Coordination: Coordination normal.  Psychiatric:        Behavior: Behavior normal. Behavior is cooperative.        Thought Content: Thought content normal.        Judgment: Judgment normal.         Patient has been counseled extensively about nutrition and exercise as well as the importance of adherence with medications and regular follow-up. The patient was given clear instructions to go to ER or return to medical center if symptoms don't improve, worsen or new problems develop. The patient verbalized understanding.   Follow-up: Return in about 3 months (around 10/19/2021).   14/05/2021, FNP-BC [facility] [place], [place]   07/31/2021, 7:48 PM

## 2021-07-22 ENCOUNTER — Other Ambulatory Visit: Payer: Self-pay

## 2021-07-31 ENCOUNTER — Encounter: Payer: Self-pay | Admitting: Nurse Practitioner

## 2021-08-29 ENCOUNTER — Other Ambulatory Visit: Payer: Self-pay

## 2021-08-30 ENCOUNTER — Other Ambulatory Visit: Payer: Self-pay

## 2021-09-30 ENCOUNTER — Ambulatory Visit: Payer: Self-pay | Attending: Nurse Practitioner

## 2021-09-30 ENCOUNTER — Other Ambulatory Visit: Payer: Self-pay

## 2021-09-30 DIAGNOSIS — Z23 Encounter for immunization: Secondary | ICD-10-CM

## 2021-10-19 ENCOUNTER — Other Ambulatory Visit: Payer: Self-pay

## 2021-10-19 ENCOUNTER — Ambulatory Visit: Payer: Self-pay | Attending: Nurse Practitioner | Admitting: Nurse Practitioner

## 2021-10-19 ENCOUNTER — Encounter: Payer: Self-pay | Admitting: Nurse Practitioner

## 2021-10-19 VITALS — BP 112/74 | HR 94 | Ht 67.0 in | Wt 177.5 lb

## 2021-10-19 DIAGNOSIS — R7303 Prediabetes: Secondary | ICD-10-CM

## 2021-10-19 DIAGNOSIS — I10 Essential (primary) hypertension: Secondary | ICD-10-CM

## 2021-10-19 DIAGNOSIS — Z23 Encounter for immunization: Secondary | ICD-10-CM

## 2021-10-19 DIAGNOSIS — E785 Hyperlipidemia, unspecified: Secondary | ICD-10-CM

## 2021-10-19 LAB — POCT GLYCOSYLATED HEMOGLOBIN (HGB A1C): Hemoglobin A1C: 5.9 % — AB (ref 4.0–5.6)

## 2021-10-19 LAB — GLUCOSE, POCT (MANUAL RESULT ENTRY): POC Glucose: 121 mg/dl — AB (ref 70–99)

## 2021-10-19 NOTE — Addendum Note (Signed)
Addended by: Vertis Kelch on: 10/19/2021 02:29 PM   Modules accepted: Orders

## 2021-10-19 NOTE — Progress Notes (Signed)
Assessment & Plan:  Cody Porter was seen today for hypertension.  Diagnoses and all orders for this visit:  Pre-diabetes -     POCT glycosylated hemoglobin (Hb A1C) -     Cancel: POCT URINALYSIS DIP (CLINITEK) -     POCT glucose (manual entry)  Essential hypertension -     CMP14+EGFR  Dyslipidemia, goal LDL below 100 -     Lipid panel   Patient has been counseled on age-appropriate routine health concerns for screening and prevention. These are reviewed and up-to-date. Referrals have been placed accordingly. Immunizations are up-to-date or declined.    Subjective:   Chief Complaint  Patient presents with   Hypertension   Cody Porter 60 y.o. male presents to office today for follow up to HTN, prediabetes and Hyperlipidemia  He has a past medical history of ETOH abuse (01/28/2019), Prediabetes, Leg DVT, acute, bilateral  (01/28/2019), Nontraumatic B/L ICH, and Pulmonary embolus  (01/28/2019  Sustained intracerebral hemorrhage of the right and left cerebellum and left external capsule while on Eliquis for treatment of bilateral lower extremity DVT and acute pulmonary embolism for which patient was admitted on 01/28/2019 through 01/31/2019.  Patient's Eliquis was discontinued and he was placed on 81 mg aspirin for anticoagulation due to paroxysmal atrial fibrillation. No other anticoagulation due to hemorrhage. IVC filter placed 02-25-2019. He never followed up with Neurology after discharge from hospital and rehab.    He currently lives with his 48 yr old mother. It appears he is dependent on her for transportation and food and shelter.    HTN Blood pressure is well controlled with amlodipine 10 mg daily and Lopressor 25 mg twice daily. BP Readings from Last 3 Encounters:  10/19/21 112/74  07/20/21 130/81  12/01/20 108/70     Prediabetes Well controlled.  He is not taking any oral diabetic medications. Lab Results  Component Value Date   HGBA1C 5.9 (A) 10/19/2021    Review of  Systems  Constitutional:  Negative for fever, malaise/fatigue and weight loss.  HENT: Negative.  Negative for nosebleeds.   Eyes: Negative.  Negative for blurred vision, double vision and photophobia.  Respiratory: Negative.  Negative for cough and shortness of breath.   Cardiovascular: Negative.  Negative for chest pain, palpitations and leg swelling.  Gastrointestinal: Negative.  Negative for heartburn, nausea and vomiting.  Musculoskeletal: Negative.  Negative for myalgias.  Neurological: Negative.  Negative for dizziness, focal weakness, seizures and headaches.  Psychiatric/Behavioral: Negative.  Negative for suicidal ideas.    Past Medical History:  Diagnosis Date   ETOH abuse 01/28/2019   Leg DVT (deep venous thromboembolism), acute, bilateral ([place]) 01/28/2019   Pulmonary embolus ([place]) 01/28/2019    Past Surgical History:  Procedure Laterality Date   HERNIA REPAIR     IR IVC FILTER PLMT / S&I /IMG GUID/MOD SED  02/25/2019   IR RADIOLOGIST EVAL & MGMT  07/17/2019   TONSILLECTOMY      Family History  Problem Relation Age of Onset   COPD Mother    Cancer Mother        Breast x2   Anuerysm Father     Social History Reviewed with no changes to be made today.   Outpatient Medications Prior to Visit  Medication Sig Dispense Refill   amLODipine (NORVASC) 10 MG tablet TAKE 1 TABLET (10 MG TOTAL) BY MOUTH DAILY. TO CONTROL BLOOD PRESSURE 90 tablet 1   aspirin 81 MG EC tablet Take 1 tablet (81 mg total) by mouth daily.  Patients states he has ASA 81 mg at home. Swallow whole. 30 tablet 12   atorvastatin (LIPITOR) 40 MG tablet Take 1 tablet (40 mg total) by mouth daily. 90 tablet 3   metoprolol tartrate (LOPRESSOR) 25 MG tablet Take 1 tablet (25 mg total) by mouth 2 (two) times daily. 180 tablet 1   pantoprazole (PROTONIX) 40 MG tablet TAKE 1 TABLET (40 MG TOTAL) BY MOUTH DAILY. (Patient not taking: Reported on 07/20/2021) 90 tablet 1   No facility-administered medications prior to  visit.    No Known Allergies     Objective:    BP 112/74   Pulse 94   Ht '5\' 7"'  (1.702 m)   Wt 177 lb 8 oz (80.5 kg)   SpO2 97%   BMI 27.80 kg/m  Wt Readings from Last 3 Encounters:  10/19/21 177 lb 8 oz (80.5 kg)  07/20/21 179 lb 2 oz (81.3 kg)  12/01/20 167 lb (75.8 kg)    Physical Exam Vitals and nursing note reviewed.  Constitutional:      Appearance: He is well-developed.  HENT:     Head: Normocephalic and atraumatic.  Cardiovascular:     Rate and Rhythm: Normal rate and regular rhythm.     Heart sounds: Normal heart sounds. No murmur heard.   No friction rub. No gallop.  Pulmonary:     Effort: Pulmonary effort is normal. No tachypnea or respiratory distress.     Breath sounds: Normal breath sounds. No decreased breath sounds, wheezing, rhonchi or rales.  Chest:     Chest wall: No tenderness.  Abdominal:     General: Bowel sounds are normal.     Palpations: Abdomen is soft.  Musculoskeletal:        General: Normal range of motion.     Cervical back: Normal range of motion.  Skin:    General: Skin is warm and dry.  Neurological:     Mental Status: He is alert and oriented to person, place, and time.     Coordination: Coordination normal.  Psychiatric:        Behavior: Behavior normal. Behavior is cooperative.        Thought Content: Thought content normal.        Judgment: Judgment normal.         Patient has been counseled extensively about nutrition and exercise as well as the importance of adherence with medications and regular follow-up. The patient was given clear instructions to go to ER or return to medical center if symptoms don't improve, worsen or new problems develop. The patient verbalized understanding.   Follow-up: Return in about 6 months (around 04/19/2022).   Gildardo Pounds, FNP-BC [facility] and [place], [place]   10/19/2021, 2:05 PM

## 2021-10-20 LAB — CMP14+EGFR
ALT: 11 IU/L (ref 0–44)
AST: 16 IU/L (ref 0–40)
Albumin/Globulin Ratio: 2 (ref 1.2–2.2)
Albumin: 5 g/dL — ABNORMAL HIGH (ref 3.8–4.8)
Alkaline Phosphatase: 95 IU/L (ref 44–121)
BUN/Creatinine Ratio: 12 (ref 10–24)
BUN: 16 mg/dL (ref 8–27)
Bilirubin Total: 0.3 mg/dL (ref 0.0–1.2)
CO2: 22 mmol/L (ref 20–29)
Calcium: 9.8 mg/dL (ref 8.6–10.2)
Chloride: 106 mmol/L (ref 96–106)
Creatinine, Ser: 1.34 mg/dL — ABNORMAL HIGH (ref 0.76–1.27)
Globulin, Total: 2.5 g/dL (ref 1.5–4.5)
Glucose: 104 mg/dL — ABNORMAL HIGH (ref 70–99)
Potassium: 4.5 mmol/L (ref 3.5–5.2)
Sodium: 145 mmol/L — ABNORMAL HIGH (ref 134–144)
Total Protein: 7.5 g/dL (ref 6.0–8.5)
eGFR: 60 mL/min/{1.73_m2} (ref >59)

## 2021-10-20 LAB — LIPID PANEL
Chol/HDL Ratio: 4.6 ratio (ref 0.0–5.0)
Cholesterol, Total: 220 mg/dL — ABNORMAL HIGH (ref 100–199)
HDL: 48 mg/dL (ref >39)
LDL Chol Calc (NIH): 146 mg/dL — ABNORMAL HIGH (ref 0–99)
Triglycerides: 143 mg/dL (ref 0–149)
VLDL Cholesterol Cal: 26 mg/dL (ref 5–40)

## 2021-10-24 ENCOUNTER — Ambulatory Visit: Payer: Self-pay | Admitting: *Deleted

## 2021-10-24 ENCOUNTER — Telehealth: Payer: Self-pay

## 2021-10-24 NOTE — Telephone Encounter (Signed)
-----   Message from Claiborne Rigg, NP sent at 10/22/2021  6:20 PM EST ----- Liver function normal. Kidney function stable. Cholesterol levels elevated. Continue lipitor as prescribed.

## 2021-10-24 NOTE — Telephone Encounter (Signed)
Pt given lab results per notes of Z. Flemng, NP from 10/22/21  on 10/24/21. Pt verbalized understanding and will continue taking lipitor.

## 2021-10-24 NOTE — Telephone Encounter (Signed)
Left message to return call to our office.  

## 2021-11-10 ENCOUNTER — Other Ambulatory Visit: Payer: Self-pay

## 2021-12-14 ENCOUNTER — Other Ambulatory Visit: Payer: Self-pay

## 2021-12-15 ENCOUNTER — Other Ambulatory Visit: Payer: Self-pay

## 2021-12-15 ENCOUNTER — Ambulatory Visit: Payer: Self-pay | Admitting: *Deleted

## 2021-12-15 NOTE — Telephone Encounter (Signed)
Pt has not been taking his atorvastatin (LIPITOR) 40 MG tablet/ he hasnt filled this since last April/ pts mother wanted to know if he should start back taking this/ please advise .  Read note from PCP from 10/22/21."Continue Lipitor as prescribed."   Reason for Disposition  Caller has medicine question only, adult not sick, AND triager answers question  Answer Assessment - Initial Assessment Questions 1. NAME of MEDICATION: "What medicine are you calling about?"     lipitor 2. QUESTION: "What is your question?" (e.g., double dose of medicine, side effect)     Should pt be taking med? 3. PRESCRIBING HCP: "Who prescribed it?" Reason: if prescribed by specialist, call should be referred to that group.     PCP  Protocols used: Medication Question Call-A-AH

## 2021-12-16 ENCOUNTER — Other Ambulatory Visit: Payer: Self-pay

## 2021-12-22 ENCOUNTER — Ambulatory Visit: Payer: Self-pay | Attending: Nurse Practitioner

## 2022-01-18 ENCOUNTER — Other Ambulatory Visit: Payer: Self-pay

## 2022-01-20 ENCOUNTER — Other Ambulatory Visit: Payer: Self-pay

## 2022-02-27 ENCOUNTER — Other Ambulatory Visit: Payer: Self-pay

## 2022-02-28 ENCOUNTER — Other Ambulatory Visit: Payer: Self-pay

## 2022-03-30 ENCOUNTER — Other Ambulatory Visit: Payer: Self-pay | Admitting: Nurse Practitioner

## 2022-03-30 ENCOUNTER — Other Ambulatory Visit: Payer: Self-pay

## 2022-03-30 DIAGNOSIS — I4891 Unspecified atrial fibrillation: Secondary | ICD-10-CM

## 2022-03-30 DIAGNOSIS — I10 Essential (primary) hypertension: Secondary | ICD-10-CM

## 2022-03-30 DIAGNOSIS — E785 Hyperlipidemia, unspecified: Secondary | ICD-10-CM

## 2022-03-30 MED ORDER — AMLODIPINE BESYLATE 10 MG PO TABS
ORAL_TABLET | ORAL | 0 refills | Status: DC
  Filled 2022-03-30: qty 30, 30d supply, fill #0

## 2022-03-30 MED ORDER — ATORVASTATIN CALCIUM 40 MG PO TABS
40.0000 mg | ORAL_TABLET | Freq: Every day | ORAL | 0 refills | Status: DC
  Filled 2022-03-30: qty 30, 30d supply, fill #0

## 2022-03-30 MED ORDER — METOPROLOL TARTRATE 25 MG PO TABS
25.0000 mg | ORAL_TABLET | Freq: Two times a day (BID) | ORAL | 0 refills | Status: DC
  Filled 2022-03-30: qty 60, 30d supply, fill #0

## 2022-03-31 ENCOUNTER — Other Ambulatory Visit: Payer: Self-pay

## 2022-05-02 ENCOUNTER — Other Ambulatory Visit: Payer: Self-pay | Admitting: Nurse Practitioner

## 2022-05-02 ENCOUNTER — Other Ambulatory Visit: Payer: Self-pay

## 2022-05-02 DIAGNOSIS — E785 Hyperlipidemia, unspecified: Secondary | ICD-10-CM

## 2022-05-02 DIAGNOSIS — I10 Essential (primary) hypertension: Secondary | ICD-10-CM

## 2022-05-02 DIAGNOSIS — I4891 Unspecified atrial fibrillation: Secondary | ICD-10-CM

## 2022-05-03 ENCOUNTER — Other Ambulatory Visit: Payer: Self-pay

## 2022-05-04 ENCOUNTER — Other Ambulatory Visit: Payer: Self-pay

## 2022-05-04 MED ORDER — AMLODIPINE BESYLATE 10 MG PO TABS
ORAL_TABLET | ORAL | 0 refills | Status: DC
  Filled 2022-05-04: qty 30, 30d supply, fill #0
  Filled 2022-06-07: qty 30, 30d supply, fill #1
  Filled 2022-07-11: qty 30, 30d supply, fill #2

## 2022-05-04 MED ORDER — METOPROLOL TARTRATE 25 MG PO TABS
25.0000 mg | ORAL_TABLET | Freq: Two times a day (BID) | ORAL | 0 refills | Status: DC
  Filled 2022-05-04: qty 60, 30d supply, fill #0
  Filled 2022-06-07: qty 60, 30d supply, fill #1
  Filled 2022-07-11: qty 60, 30d supply, fill #2

## 2022-05-04 MED ORDER — ATORVASTATIN CALCIUM 40 MG PO TABS
40.0000 mg | ORAL_TABLET | Freq: Every day | ORAL | 0 refills | Status: DC
  Filled 2022-05-04: qty 30, 30d supply, fill #0
  Filled 2022-06-07: qty 30, 30d supply, fill #1
  Filled 2022-07-11 (×2): qty 30, 30d supply, fill #2

## 2022-05-04 NOTE — Telephone Encounter (Signed)
Requested Prescriptions  Pending Prescriptions Disp Refills  . amLODipine (NORVASC) 10 MG tablet 30 tablet 0    Sig: TAKE 1 TABLET (10 MG TOTAL) BY MOUTH DAILY. TO CONTROL BLOOD PRESSURE     Cardiovascular: Calcium Channel Blockers 2 Failed - 05/04/2022  2:24 PM      Failed - Valid encounter within last 6 months    Recent Outpatient Visits          6 months ago Pre-diabetes   [facility] [place], Shea Stakes, NP   9 months ago Essential hypertension   [place] [facility] [place], Shea Stakes, NP   1 year ago Dyslipidemia, goal LDL below 100   [facility] [place], Shea Stakes, NP   1 year ago Essential hypertension   [facility] [place], Shea Stakes, NP   1 year ago Pre-diabetes   [facility] [place], Marzella Schlein, [place]      Future Appointments            In 1 month Anders Simmonds, PA-C [place] [facility]           Passed - Last BP in normal range    BP Readings from Last 1 Encounters:  10/19/21 112/74         Passed - Last Heart Rate in normal range    Pulse Readings from Last 1 Encounters:  10/19/21 94         . atorvastatin (LIPITOR) 40 MG tablet 30 tablet 0    Sig: Take 1 tablet (40 mg total) by mouth daily.     Cardiovascular:  Antilipid - Statins Failed - 05/04/2022  2:24 PM      Failed - Lipid Panel in normal range within the last 12 months    Cholesterol, Total  Date Value Ref Range Status  10/19/2021 220 (H) 100 - 199 mg/dL Final   LDL Chol Calc (NIH)  Date Value Ref Range Status  10/19/2021 146 (H) 0 - 99 mg/dL Final   HDL  Date Value Ref Range Status  10/19/2021 48 >39 mg/dL Final   Triglycerides  Date Value Ref Range Status  10/19/2021 143 0 - 149 mg/dL Final         Passed - Patient is not pregnant      Passed - Valid encounter within last 12 months    Recent Outpatient Visits          6  months ago Pre-diabetes   [place] [facility] [place], Shea Stakes, NP   9 months ago Essential hypertension   [address] [facility] [place], Shea Stakes, NP   1 year ago Dyslipidemia, goal LDL below 100   [facility] [place], Shea Stakes, NP   1 year ago Essential hypertension   [facility] [place], Shea Stakes, NP   1 year ago Pre-diabetes   [facility] [place], Marzella Schlein, [place]      Future Appointments            In 1 month [address], Marzella Schlein, PA-C [place] [facility]           . metoprolol tartrate (LOPRESSOR) 25 MG tablet 60 tablet 0    Sig: Take 1 tablet (25 mg total) by mouth 2 (two) times daily.  Cardiovascular:  Beta Blockers Failed - 05/04/2022  2:24 PM      Failed - Valid encounter within last 6 months    Recent Outpatient Visits          6 months ago Pre-diabetes   [facility] [place], Shea Stakes, NP   9 months ago Essential hypertension   [facility] [place], Shea Stakes, NP   1 year ago Dyslipidemia, goal LDL below 100   [facility] [place], Shea Stakes, NP   1 year ago Essential hypertension   [facility] [place], Shea Stakes, NP   1 year ago Pre-diabetes   [facility] [place], Marzella Schlein, [place]      Future Appointments            In 1 month Anders Simmonds, PA-C [place] [facility]           Passed - Last BP in normal range    BP Readings from Last 1 Encounters:  10/19/21 112/74         Passed - Last Heart Rate in normal range    Pulse Readings from Last 1 Encounters:  10/19/21 94         Refused Prescriptions Disp Refills  . amLODipine (NORVASC) 10 MG tablet 30 tablet 0    Sig: TAKE 1 TABLET (10 MG TOTAL) BY MOUTH DAILY. TO CONTROL BLOOD PRESSURE      Cardiovascular: Calcium Channel Blockers 2 Failed - 05/04/2022  2:24 PM      Failed - Valid encounter within last 6 months    Recent Outpatient Visits          6 months ago Pre-diabetes   [facility] [place], Shea Stakes, NP   9 months ago Essential hypertension   [place] [facility] [place], Shea Stakes, NP   1 year ago Dyslipidemia, goal LDL below 100   [facility] [place], Shea Stakes, NP   1 year ago Essential hypertension   [facility] [place], Shea Stakes, NP   1 year ago Pre-diabetes   [facility] [place], Marzella Schlein, [place]      Future Appointments            In 1 month Anders Simmonds, PA-C [place] [facility]           Passed - Last BP in normal range    BP Readings from Last 1 Encounters:  10/19/21 112/74         Passed - Last Heart Rate in normal range    Pulse Readings from Last 1 Encounters:  10/19/21 94         . atorvastatin (LIPITOR) 40 MG tablet 30 tablet 0    Sig: Take 1 tablet (40 mg total) by mouth daily.     Cardiovascular:  Antilipid - Statins Failed - 05/04/2022  2:24 PM      Failed - Lipid Panel in normal range within the last 12 months    Cholesterol, Total  Date Value Ref Range Status  10/19/2021 220 (H) 100 - 199 mg/dL Final   LDL Chol Calc (NIH)  Date Value Ref Range Status  10/19/2021 146 (H) 0 - 99 mg/dL Final   HDL  Date Value Ref Range Status  10/19/2021 48 >  39 mg/dL Final   Triglycerides  Date Value Ref Range Status  10/19/2021 143 0 - 149 mg/dL Final         Passed - Patient is not pregnant      Passed - Valid encounter within last 12 months    Recent Outpatient Visits          6 months ago Pre-diabetes   [place] [facility] [place], Shea Stakes, NP   9 months ago Essential hypertension   [place] [facility] [place], Shea Stakes, NP   1  year ago Dyslipidemia, goal LDL below 100   [facility] [place], Shea Stakes, NP   1 year ago Essential hypertension   [facility] [place], Shea Stakes, NP   1 year ago Pre-diabetes   [facility] [place], Marzella Schlein, [place]      Future Appointments            In 1 month [place], Marzella Schlein, PA-C [place] [facility]           . metoprolol tartrate (LOPRESSOR) 25 MG tablet 60 tablet 0    Sig: Take 1 tablet (25 mg total) by mouth 2 (two) times daily.     Cardiovascular:  Beta Blockers Failed - 05/04/2022  2:24 PM      Failed - Valid encounter within last 6 months    Recent Outpatient Visits          6 months ago Pre-diabetes   [facility] [place], Shea Stakes, NP   9 months ago Essential hypertension   [facility] [place], Shea Stakes, NP   1 year ago Dyslipidemia, goal LDL below 100   [facility] [place], Shea Stakes, NP   1 year ago Essential hypertension   [facility] [place], Shea Stakes, NP   1 year ago Pre-diabetes   [facility] [place], Marzella Schlein, [place]      Future Appointments            In 1 month Anders Simmonds, PA-C [place] [facility]           Passed - Last BP in normal range    BP Readings from Last 1 Encounters:  10/19/21 112/74         Passed - Last Heart Rate in normal range    Pulse Readings from Last 1 Encounters:  10/19/21 94

## 2022-05-04 NOTE — Addendum Note (Signed)
Addended by: Elby Beck F on: 05/04/2022 02:24 PM   Modules accepted: Orders

## 2022-05-04 NOTE — Telephone Encounter (Signed)
Requested medication (s) are due for refill today: yes  Requested medication (s) are on the active medication list: yes  Last refill:  n/a  Future visit scheduled: no  Notes to clinic: Please see telephone note about pt requesting courtesy refill. Do not see future OV scheduled, routing for approval.     Requested Prescriptions  Pending Prescriptions Disp Refills   amLODipine (NORVASC) 10 MG tablet 30 tablet 0    Sig: TAKE 1 TABLET (10 MG TOTAL) BY MOUTH DAILY. TO CONTROL BLOOD PRESSURE     Cardiovascular: Calcium Channel Blockers 2 Failed - 05/04/2022  2:24 PM      Failed - Valid encounter within last 6 months    Recent Outpatient Visits           6 months ago Pre-diabetes   [facility] [place], Shea Stakes, NP   9 months ago Essential hypertension   [place] [facility] [place], Shea Stakes, NP   1 year ago Dyslipidemia, goal LDL below 100   [facility] [place], Shea Stakes, NP   1 year ago Essential hypertension   [facility] [place], Shea Stakes, NP   1 year ago Pre-diabetes   [facility] [place], Marylene Land M, [place]              Passed - Last BP in normal range    BP Readings from Last 1 Encounters:  10/19/21 112/74         Passed - Last Heart Rate in normal range    Pulse Readings from Last 1 Encounters:  10/19/21 94          atorvastatin (LIPITOR) 40 MG tablet 30 tablet 0    Sig: Take 1 tablet (40 mg total) by mouth daily.     Cardiovascular:  Antilipid - Statins Failed - 05/04/2022  2:24 PM      Failed - Lipid Panel in normal range within the last 12 months    Cholesterol, Total  Date Value Ref Range Status  10/19/2021 220 (H) 100 - 199 mg/dL Final   LDL Chol Calc (NIH)  Date Value Ref Range Status  10/19/2021 146 (H) 0 - 99 mg/dL Final   HDL  Date Value Ref Range Status  10/19/2021 48 >39 mg/dL Final   Triglycerides  Date  Value Ref Range Status  10/19/2021 143 0 - 149 mg/dL Final         Passed - Patient is not pregnant      Passed - Valid encounter within last 12 months    Recent Outpatient Visits           6 months ago Pre-diabetes   [place] [facility] [place], Shea Stakes, NP   9 months ago Essential hypertension   [place] [facility] [place], Shea Stakes, NP   1 year ago Dyslipidemia, goal LDL below 100   [facility] [place], [place] W, NP   1 year ago Essential hypertension   [place] [address] [facility] [place], [place] W, NP   1 year ago Pre-diabetes   [facility] [place], Marylene Land M, [place]               metoprolol tartrate (LOPRESSOR) 25 MG tablet 60 tablet 0    Sig: Take 1 tablet (25 mg total) by mouth 2 (two) times daily.  Cardiovascular:  Beta Blockers Failed - 05/04/2022  2:24 PM      Failed - Valid encounter within last 6 months    Recent Outpatient Visits           6 months ago Pre-diabetes   [facility] [place], Shea Stakes, NP   9 months ago Essential hypertension   [facility] [place], Shea Stakes, NP   1 year ago Dyslipidemia, goal LDL below 100   [facility] [place], Shea Stakes, NP   1 year ago Essential hypertension   [facility] [place], Shea Stakes, NP   1 year ago Pre-diabetes   [facility] [place], Marylene Land M, [place]              Passed - Last BP in normal range    BP Readings from Last 1 Encounters:  10/19/21 112/74         Passed - Last Heart Rate in normal range    Pulse Readings from Last 1 Encounters:  10/19/21 94         Refused Prescriptions Disp Refills   amLODipine (NORVASC) 10 MG tablet 30 tablet 0    Sig: TAKE 1 TABLET (10 MG TOTAL) BY MOUTH DAILY. TO CONTROL BLOOD PRESSURE     Cardiovascular: Calcium  Channel Blockers 2 Failed - 05/04/2022  2:24 PM      Failed - Valid encounter within last 6 months    Recent Outpatient Visits           6 months ago Pre-diabetes   [facility] [place], Shea Stakes, NP   9 months ago Essential hypertension   [place] [facility] [place], Shea Stakes, NP   1 year ago Dyslipidemia, goal LDL below 100   [facility] [place], Shea Stakes, NP   1 year ago Essential hypertension   [facility] [place], Shea Stakes, NP   1 year ago Pre-diabetes   [facility] [place], Marylene Land M, [place]              Passed - Last BP in normal range    BP Readings from Last 1 Encounters:  10/19/21 112/74         Passed - Last Heart Rate in normal range    Pulse Readings from Last 1 Encounters:  10/19/21 94          atorvastatin (LIPITOR) 40 MG tablet 30 tablet 0    Sig: Take 1 tablet (40 mg total) by mouth daily.     Cardiovascular:  Antilipid - Statins Failed - 05/04/2022  2:24 PM      Failed - Lipid Panel in normal range within the last 12 months    Cholesterol, Total  Date Value Ref Range Status  10/19/2021 220 (H) 100 - 199 mg/dL Final   LDL Chol Calc (NIH)  Date Value Ref Range Status  10/19/2021 146 (H) 0 - 99 mg/dL Final   HDL  Date Value Ref Range Status  10/19/2021 48 >39 mg/dL Final   Triglycerides  Date Value Ref Range Status  10/19/2021 143 0 - 149 mg/dL Final         Passed - Patient is not pregnant      Passed - Valid encounter within last 12 months    Recent Outpatient Visits  6 months ago Pre-diabetes   [facility] [place], Shea Stakes, NP   9 months ago Essential hypertension   [facility] [place], Shea Stakes, NP   1 year ago Dyslipidemia, goal LDL below 100   [facility] [place], [place] W, NP   1 year ago Essential  hypertension   [place] [address] [facility] [place], [place] W, NP   1 year ago Pre-diabetes   [facility] [place], Marylene Land M, [place]               metoprolol tartrate (LOPRESSOR) 25 MG tablet 60 tablet 0    Sig: Take 1 tablet (25 mg total) by mouth 2 (two) times daily.     Cardiovascular:  Beta Blockers Failed - 05/04/2022  2:24 PM      Failed - Valid encounter within last 6 months    Recent Outpatient Visits           6 months ago Pre-diabetes   [facility] [place], Shea Stakes, NP   9 months ago Essential hypertension   [facility] [place], Shea Stakes, NP   1 year ago Dyslipidemia, goal LDL below 100   [facility] [place], Shea Stakes, NP   1 year ago Essential hypertension   [facility] [place], Shea Stakes, NP   1 year ago Pre-diabetes   [facility] [place], Marylene Land M, [place]              Passed - Last BP in normal range    BP Readings from Last 1 Encounters:  10/19/21 112/74         Passed - Last Heart Rate in normal range    Pulse Readings from Last 1 Encounters:  10/19/21 94

## 2022-05-04 NOTE — Telephone Encounter (Signed)
the patient called and scheduled an appt with Zelda for her next opening on 9.22.23 please advise if pt can get courtesy refills until his appt or if he can be seen soon / pt will run out of meds today and would like a call if he can pick these up from the pharmacy by tomorrow   Amlodipine Atorvastatin  Metoprolol   Send to  [facility] Phone:  [phone]  Fax:  [phone]

## 2022-05-05 ENCOUNTER — Other Ambulatory Visit: Payer: Self-pay

## 2022-06-07 ENCOUNTER — Other Ambulatory Visit: Payer: Self-pay

## 2022-06-07 ENCOUNTER — Ambulatory Visit: Payer: Self-pay | Attending: Physician Assistant | Admitting: Physician Assistant

## 2022-06-07 ENCOUNTER — Encounter: Payer: Self-pay | Admitting: Physician Assistant

## 2022-06-07 VITALS — BP 110/75 | HR 95 | Ht 67.0 in | Wt 166.0 lb

## 2022-06-07 DIAGNOSIS — I10 Essential (primary) hypertension: Secondary | ICD-10-CM

## 2022-06-07 DIAGNOSIS — Z7982 Long term (current) use of aspirin: Secondary | ICD-10-CM

## 2022-06-07 DIAGNOSIS — E785 Hyperlipidemia, unspecified: Secondary | ICD-10-CM

## 2022-06-07 DIAGNOSIS — I4891 Unspecified atrial fibrillation: Secondary | ICD-10-CM

## 2022-06-07 DIAGNOSIS — R7303 Prediabetes: Secondary | ICD-10-CM

## 2022-06-07 MED ORDER — ASPIRIN 81 MG PO TBEC
81.0000 mg | DELAYED_RELEASE_TABLET | Freq: Every day | ORAL | 12 refills | Status: DC
  Filled 2022-06-07: qty 30, 30d supply, fill #0

## 2022-06-07 MED ORDER — METOPROLOL TARTRATE 25 MG PO TABS
25.0000 mg | ORAL_TABLET | Freq: Two times a day (BID) | ORAL | 1 refills | Status: DC
  Filled 2022-08-10: qty 180, 90d supply, fill #0
  Filled 2022-11-09 (×2): qty 180, 90d supply, fill #1

## 2022-06-07 MED ORDER — AMLODIPINE BESYLATE 10 MG PO TABS
10.0000 mg | ORAL_TABLET | Freq: Every day | ORAL | 1 refills | Status: DC
  Filled 2022-08-10: qty 90, 90d supply, fill #0
  Filled 2022-11-09: qty 90, 90d supply, fill #1

## 2022-06-07 MED ORDER — ATORVASTATIN CALCIUM 40 MG PO TABS
40.0000 mg | ORAL_TABLET | Freq: Every day | ORAL | 1 refills | Status: DC
  Filled 2022-08-10: qty 90, 90d supply, fill #0
  Filled 2022-11-09: qty 90, 90d supply, fill #1

## 2022-06-07 NOTE — Progress Notes (Signed)
Patient ID: Cody Porter, male   DOB: 07/29/1961, 61 y.o.   MRN: [id number]   Cody Porter, is a 61 y.o. male  [id number]  [id number]  DOB - 03-Dec-1960  Chief Complaint  Patient presents with   Medication Refill       Subjective:   Cody Porter is a 61 y.o. male here today for med RF.  He has cut back on smoking.  Has not drank alcohol in about 3 years.  No new issues or concerns.  Needs med RF and time for bloodwork.  No HA/CP/SOB/dizziness.  Compliant with meds.  No new issues or concerns  No problems updated.  ALLERGIES: No Known Allergies  PAST MEDICAL HISTORY: Past Medical History:  Diagnosis Date   ETOH abuse 01/28/2019   Leg DVT (deep venous thromboembolism), acute, bilateral (HCC) 01/28/2019   Pulmonary embolus (HCC) 01/28/2019    MEDICATIONS AT HOME: Prior to Admission medications   Medication Sig Start Date End Date Taking? Authorizing Provider  amLODipine (NORVASC) 10 MG tablet TAKE 1 TABLET (10 MG TOTAL) BY MOUTH DAILY. TO CONTROL BLOOD PRESSURE 06/07/22 09/05/22  Anders Simmonds, PA-C  aspirin EC 81 MG tablet Take 1 tablet (81 mg total) by mouth daily. Patients states he has ASA 81 mg at home. Swallow whole. 06/07/22   Anders Simmonds, PA-C  atorvastatin (LIPITOR) 40 MG tablet Take 1 tablet (40 mg total) by mouth daily. 06/07/22   Anders Simmonds, PA-C  metoprolol tartrate (LOPRESSOR) 25 MG tablet Take 1 tablet (25 mg total) by mouth 2 (two) times daily. 06/07/22   Justin Buechner, Marzella Schlein, PA-C    ROS: Neg HEENT Neg resp Neg cardiac Neg GI Neg GU Neg MS Neg psych Neg neuro  Objective:   Vitals:   06/07/22 1450  BP: 110/75  Pulse: 95  SpO2: 96%  Weight: 166 lb (75.3 kg)  Height: 5\' 7"  (1.702 m)   Exam General appearance : Awake, alert, not in any distress. Speech Clear. Not toxic looking HEENT: Atraumatic and Normocephalic Neck: Supple, no JVD. No cervical lymphadenopathy.  Chest: Good air entry bilaterally, CTAB.  No  rales/rhonchi/wheezing CVS: S1 S2 regular, no murmurs.  Extremities: B/L Lower Ext shows no edema, both legs are warm to touch Neurology: Awake alert, and oriented X 3, CN II-XII intact, Non focal Skin: No Rash  Data Review Lab Results  Component Value Date   HGBA1C 5.9 (A) 10/19/2021   HGBA1C 5.6 (A) 12/01/2020   HGBA1C 5.6 05/13/2020    Assessment & Plan   1. Essential hypertension controlled - amLODipine (NORVASC) 10 MG tablet; TAKE 1 TABLET (10 MG TOTAL) BY MOUTH DAILY. TO CONTROL BLOOD PRESSURE  Dispense: 90 tablet; Refill: 1 - Comprehensive metabolic panel  2. Dyslipidemia, goal LDL below 100 - atorvastatin (LIPITOR) 40 MG tablet; Take 1 tablet (40 mg total) by mouth daily.  Dispense: 90 tablet; Refill: 1 - Comprehensive metabolic panel - Lipid panel  3. Atrial fibrillation, unspecified type (HCC) - metoprolol tartrate (LOPRESSOR) 25 MG tablet; Take 1 tablet (25 mg total) by mouth 2 (two) times daily.  Dispense: 180 tablet; Refill: 1 - aspirin EC 81 MG tablet; Take 1 tablet (81 mg total) by mouth daily. Patients states he has ASA 81 mg at home. Swallow whole.  Dispense: 30 tablet; Refill: 12  4. Long-term use of aspirin therapy - aspirin EC 81 MG tablet; Take 1 tablet (81 mg total) by mouth daily. Patients states he has ASA 81 mg at home. Swallow whole.  Dispense: 30 tablet; Refill: 12  5. Pre-diabetes I have had a lengthy discussion and provided education about insulin resistance and the intake of too much sugar/refined carbohydrates.  I have advised the patient to work at a goal of eliminating sugary drinks, candy, desserts, sweets, refined sugars, processed foods, and white carbohydrates.  The patient expresses understanding.  Z XC - Hemoglobin A1c - Comprehensive metabolic panel    Return in about 6 months (around 12/08/2022) for PCP for chronic conditions.  The patient was given clear instructions to go to ER or return to medical center if symptoms don't improve,  worsen or new problems develop. The patient verbalized understanding. The patient was told to call to get lab results if they haven't heard anything in the next week.      Georgian Co, PA-C [facility]ness [place], [place] [phone]   06/07/2022, 2:58 PM

## 2022-06-08 LAB — COMPREHENSIVE METABOLIC PANEL
ALT: 7 IU/L (ref 0–44)
AST: 14 IU/L (ref 0–40)
Albumin/Globulin Ratio: 1.9 (ref 1.2–2.2)
Albumin: 4.8 g/dL (ref 3.9–4.9)
Alkaline Phosphatase: 72 IU/L (ref 44–121)
BUN/Creatinine Ratio: 10 (ref 10–24)
BUN: 14 mg/dL (ref 8–27)
Bilirubin Total: 0.3 mg/dL (ref 0.0–1.2)
CO2: 21 mmol/L (ref 20–29)
Calcium: 10 mg/dL (ref 8.6–10.2)
Chloride: 105 mmol/L (ref 96–106)
Creatinine, Ser: 1.39 mg/dL — ABNORMAL HIGH (ref 0.76–1.27)
Globulin, Total: 2.5 g/dL (ref 1.5–4.5)
Glucose: 99 mg/dL (ref 70–99)
Potassium: 4.5 mmol/L (ref 3.5–5.2)
Sodium: 142 mmol/L (ref 134–144)
Total Protein: 7.3 g/dL (ref 6.0–8.5)
eGFR: 57 mL/min/{1.73_m2} — ABNORMAL LOW (ref >59)

## 2022-06-08 LAB — LIPID PANEL
Chol/HDL Ratio: 2.8 ratio (ref 0.0–5.0)
Cholesterol, Total: 130 mg/dL (ref 100–199)
HDL: 47 mg/dL (ref >39)
LDL Chol Calc (NIH): 63 mg/dL (ref 0–99)
Triglycerides: 111 mg/dL (ref 0–149)
VLDL Cholesterol Cal: 20 mg/dL (ref 5–40)

## 2022-06-08 LAB — HEMOGLOBIN A1C
Est. average glucose Bld gHb Est-mCnc: 123 mg/dL
Hgb A1c MFr Bld: 5.9 % — ABNORMAL HIGH (ref 4.8–5.6)

## 2022-07-11 ENCOUNTER — Other Ambulatory Visit: Payer: Self-pay

## 2022-08-04 ENCOUNTER — Ambulatory Visit: Payer: Self-pay | Admitting: Nurse Practitioner

## 2022-08-10 ENCOUNTER — Other Ambulatory Visit: Payer: Self-pay

## 2022-11-09 ENCOUNTER — Other Ambulatory Visit: Payer: Self-pay

## 2022-12-08 ENCOUNTER — Encounter: Payer: Self-pay | Admitting: Nurse Practitioner

## 2022-12-08 ENCOUNTER — Ambulatory Visit: Payer: Self-pay | Attending: Nurse Practitioner | Admitting: Nurse Practitioner

## 2022-12-08 VITALS — BP 124/72 | HR 60 | Ht 67.0 in | Wt 159.0 lb

## 2022-12-08 DIAGNOSIS — I10 Essential (primary) hypertension: Secondary | ICD-10-CM

## 2022-12-08 DIAGNOSIS — E785 Hyperlipidemia, unspecified: Secondary | ICD-10-CM

## 2022-12-08 DIAGNOSIS — R7303 Prediabetes: Secondary | ICD-10-CM

## 2022-12-08 LAB — POCT GLYCOSYLATED HEMOGLOBIN (HGB A1C): HbA1c, POC (prediabetic range): 5.8 % (ref 5.7–6.4)

## 2022-12-08 NOTE — Progress Notes (Unsigned)
Filter placed

## 2022-12-08 NOTE — Progress Notes (Unsigned)
   Assessment & Plan:  Cody Porter was seen today for hypertension.  Diagnoses and all orders for this visit:  Pre-diabetes -     POCT glycosylated hemoglobin (Hb A1C) -     POCT glucose (manual entry)    Patient has been counseled on age-appropriate routine health concerns for screening and prevention. These are reviewed and up-to-date. Referrals have been placed accordingly. Immunizations are up-to-date or declined.    Subjective:   Chief Complaint  Patient presents with   Hypertension   HPI Cody Porter 62 y.o. male presents to office today   BP Readings from Last 3 Encounters:  12/08/22 124/72  06/07/22 110/75  10/19/21 112/74     Fractured  ROS  Past Medical History:  Diagnosis Date   ETOH abuse 01/28/2019   Leg DVT (deep venous thromboembolism), acute, bilateral ([place]) 01/28/2019   Pulmonary embolus ([place]) 01/28/2019    Past Surgical History:  Procedure Laterality Date   HERNIA REPAIR     IR IVC FILTER PLMT / S&I /IMG GUID/MOD SED  02/25/2019   IR RADIOLOGIST EVAL & MGMT  07/17/2019   TONSILLECTOMY      Family History  Problem Relation Age of Onset   COPD Mother    Cancer Mother        Breast x2   Anuerysm Father     Social History Reviewed with no changes to be made today.   Outpatient Medications Prior to Visit  Medication Sig Dispense Refill   amLODipine (NORVASC) 10 MG tablet TAKE 1 TABLET (10 MG TOTAL) BY MOUTH DAILY. TO CONTROL BLOOD PRESSURE 90 tablet 1   aspirin EC 81 MG tablet Take 1 tablet (81 mg total) by mouth daily. Patients states he has ASA 81 mg at home. Swallow whole. 30 tablet 12   atorvastatin (LIPITOR) 40 MG tablet Take 1 tablet (40 mg total) by mouth daily. 90 tablet 0   metoprolol tartrate (LOPRESSOR) 25 MG tablet Take 1 tablet (25 mg total) by mouth 2 (two) times daily. 180 tablet 1   amLODipine (NORVASC) 10 MG tablet TAKE 1 TABLET (10 MG TOTAL) BY MOUTH DAILY. TO CONTROL BLOOD PRESSURE (Patient not taking: Reported on 12/08/2022) 90  tablet 0   atorvastatin (LIPITOR) 40 MG tablet Take 1 tablet (40 mg total) by mouth daily. (Patient not taking: Reported on 12/08/2022) 90 tablet 1   metoprolol tartrate (LOPRESSOR) 25 MG tablet Take 1 tablet (25 mg total) by mouth 2 (two) times daily. (Patient not taking: Reported on 12/08/2022) 180 tablet 0   No facility-administered medications prior to visit.    No Known Allergies     Objective:    BP 124/72   Pulse 60   Ht 5\' 7"  (1.702 m)   Wt 159 lb (72.1 kg)   SpO2 96%   BMI 24.90 kg/m  Wt Readings from Last 3 Encounters:  12/08/22 159 lb (72.1 kg)  06/07/22 166 lb (75.3 kg)  10/19/21 177 lb 8 oz (80.5 kg)    Physical Exam       Patient has been counseled extensively about nutrition and exercise as well as the importance of adherence with medications and regular follow-up. The patient was given clear instructions to go to ER or return to medical center if symptoms don't improve, worsen or new problems develop. The patient verbalized understanding.   Follow-up: No follow-ups on file.   Gildardo Pounds, FNP-BC [facility] and [place], [place]   12/08/2022, 3:37 PM

## 2022-12-09 LAB — CMP14+EGFR
ALT: 11 IU/L (ref 0–44)
AST: 19 IU/L (ref 0–40)
Albumin/Globulin Ratio: 2.2 (ref 1.2–2.2)
Albumin: 5 g/dL — ABNORMAL HIGH (ref 3.9–4.9)
Alkaline Phosphatase: 57 IU/L (ref 44–121)
BUN/Creatinine Ratio: 9 — ABNORMAL LOW (ref 10–24)
BUN: 13 mg/dL (ref 8–27)
Bilirubin Total: 0.3 mg/dL (ref 0.0–1.2)
CO2: 23 mmol/L (ref 20–29)
Calcium: 9.7 mg/dL (ref 8.6–10.2)
Chloride: 103 mmol/L (ref 96–106)
Creatinine, Ser: 1.39 mg/dL — ABNORMAL HIGH (ref 0.76–1.27)
Globulin, Total: 2.3 g/dL (ref 1.5–4.5)
Glucose: 95 mg/dL (ref 70–99)
Potassium: 4.3 mmol/L (ref 3.5–5.2)
Sodium: 141 mmol/L (ref 134–144)
Total Protein: 7.3 g/dL (ref 6.0–8.5)
eGFR: 57 mL/min/{1.73_m2} — ABNORMAL LOW (ref >59)

## 2022-12-10 ENCOUNTER — Encounter: Payer: Self-pay | Admitting: Nurse Practitioner

## 2022-12-10 MED ORDER — ATORVASTATIN CALCIUM 40 MG PO TABS
40.0000 mg | ORAL_TABLET | Freq: Every day | ORAL | 1 refills | Status: DC
  Filled 2022-12-10 – 2023-02-06 (×2): qty 90, 90d supply, fill #0
  Filled 2023-05-03: qty 90, 90d supply, fill #1

## 2022-12-11 ENCOUNTER — Other Ambulatory Visit: Payer: Self-pay

## 2023-02-06 ENCOUNTER — Other Ambulatory Visit: Payer: Self-pay | Admitting: Physician Assistant

## 2023-02-06 ENCOUNTER — Other Ambulatory Visit: Payer: Self-pay

## 2023-02-06 DIAGNOSIS — I10 Essential (primary) hypertension: Secondary | ICD-10-CM

## 2023-02-06 DIAGNOSIS — I4891 Unspecified atrial fibrillation: Secondary | ICD-10-CM

## 2023-02-06 MED ORDER — AMLODIPINE BESYLATE 10 MG PO TABS
10.0000 mg | ORAL_TABLET | Freq: Every day | ORAL | 0 refills | Status: DC
  Filled 2023-02-06: qty 90, 90d supply, fill #0

## 2023-02-06 MED ORDER — METOPROLOL TARTRATE 25 MG PO TABS
25.0000 mg | ORAL_TABLET | Freq: Two times a day (BID) | ORAL | 0 refills | Status: DC
  Filled 2023-02-06: qty 180, 90d supply, fill #0

## 2023-02-07 ENCOUNTER — Other Ambulatory Visit (HOSPITAL_COMMUNITY): Payer: Self-pay

## 2023-02-07 ENCOUNTER — Other Ambulatory Visit: Payer: Self-pay

## 2023-05-03 ENCOUNTER — Other Ambulatory Visit: Payer: Self-pay

## 2023-05-03 ENCOUNTER — Other Ambulatory Visit: Payer: Self-pay | Admitting: Family Medicine

## 2023-05-03 DIAGNOSIS — I4891 Unspecified atrial fibrillation: Secondary | ICD-10-CM

## 2023-05-03 DIAGNOSIS — I10 Essential (primary) hypertension: Secondary | ICD-10-CM

## 2023-05-03 MED ORDER — METOPROLOL TARTRATE 25 MG PO TABS
25.0000 mg | ORAL_TABLET | Freq: Two times a day (BID) | ORAL | 0 refills | Status: DC
  Filled 2023-05-03: qty 180, 90d supply, fill #0

## 2023-05-03 MED ORDER — AMLODIPINE BESYLATE 10 MG PO TABS
10.0000 mg | ORAL_TABLET | Freq: Every day | ORAL | 0 refills | Status: DC
  Filled 2023-05-03: qty 90, 90d supply, fill #0

## 2023-05-03 NOTE — Telephone Encounter (Signed)
Requested Prescriptions  Pending Prescriptions Disp Refills   amLODipine (NORVASC) 10 MG tablet 90 tablet 0    Sig: TAKE 1 TABLET (10 MG TOTAL) BY MOUTH DAILY. TO CONTROL BLOOD PRESSURE     Cardiovascular: Calcium Channel Blockers 2 Passed - 05/03/2023 12:49 PM      Passed - Last BP in normal range    BP Readings from Last 1 Encounters:  12/08/22 124/72         Passed - Last Heart Rate in normal range    Pulse Readings from Last 1 Encounters:  12/08/22 60         Passed - Valid encounter within last 6 months    Recent Outpatient Visits           4 months ago Essential hypertension   [place] [facility] [place], Shea Stakes, NP   11 months ago Pre-diabetes   [place] [facility] [place], Marzella Schlein, [place]   1 year ago Pre-diabetes   [place] [facility] [place], Shea Stakes, NP   1 year ago Essential hypertension   [place] [facility] [place], [place] W, NP   2 years ago Dyslipidemia, goal LDL below 100   [facility] [place], Shea Stakes, NP       Future Appointments             In 1 month Claiborne Rigg, NP [facility]             metoprolol tartrate (LOPRESSOR) 25 MG tablet 180 tablet 0    Sig: Take 1 tablet (25 mg total) by mouth 2 (two) times daily.     Cardiovascular:  Beta Blockers Passed - 05/03/2023 12:49 PM      Passed - Last BP in normal range    BP Readings from Last 1 Encounters:  12/08/22 124/72         Passed - Last Heart Rate in normal range    Pulse Readings from Last 1 Encounters:  12/08/22 60         Passed - Valid encounter within last 6 months    Recent Outpatient Visits           4 months ago Essential hypertension   [place] [facility] [place], Shea Stakes, NP   11 months ago Pre-diabetes   [facility] [place], Marzella [place]lein, [place]   1 year ago Pre-diabetes   [place] [facility] [place], Shea Stakes, NP   1 year ago Essential hypertension   [place] [facility] [place], [place] W, NP   2 years ago Dyslipidemia, goal LDL below 100   [facility] Claiborne Rigg, NP       Future Appointments             In 1 month Claiborne Rigg, NP [facility]

## 2023-05-04 ENCOUNTER — Other Ambulatory Visit: Payer: Self-pay

## 2023-06-11 ENCOUNTER — Encounter: Payer: Self-pay | Admitting: Nurse Practitioner

## 2023-06-11 ENCOUNTER — Other Ambulatory Visit: Payer: Self-pay

## 2023-06-11 ENCOUNTER — Ambulatory Visit: Payer: Medicaid Other | Attending: Nurse Practitioner | Admitting: Nurse Practitioner

## 2023-06-11 VITALS — BP 120/74 | HR 62 | Ht 67.0 in | Wt 155.0 lb

## 2023-06-11 DIAGNOSIS — Z23 Encounter for immunization: Secondary | ICD-10-CM

## 2023-06-11 DIAGNOSIS — R7303 Prediabetes: Secondary | ICD-10-CM

## 2023-06-11 DIAGNOSIS — F17210 Nicotine dependence, cigarettes, uncomplicated: Secondary | ICD-10-CM

## 2023-06-11 DIAGNOSIS — I10 Essential (primary) hypertension: Secondary | ICD-10-CM | POA: Diagnosis not present

## 2023-06-11 DIAGNOSIS — E785 Hyperlipidemia, unspecified: Secondary | ICD-10-CM

## 2023-06-11 DIAGNOSIS — I4891 Unspecified atrial fibrillation: Secondary | ICD-10-CM | POA: Diagnosis not present

## 2023-06-11 DIAGNOSIS — R350 Frequency of micturition: Secondary | ICD-10-CM

## 2023-06-11 DIAGNOSIS — R7989 Other specified abnormal findings of blood chemistry: Secondary | ICD-10-CM

## 2023-06-11 MED ORDER — ASPIRIN 81 MG PO TBEC
81.0000 mg | DELAYED_RELEASE_TABLET | Freq: Every day | ORAL | 3 refills | Status: AC
  Filled 2023-06-11: qty 120, 120d supply, fill #0

## 2023-06-11 MED ORDER — METOPROLOL TARTRATE 25 MG PO TABS
25.0000 mg | ORAL_TABLET | Freq: Two times a day (BID) | ORAL | 1 refills | Status: DC
Start: 2023-06-11 — End: 2023-11-12
  Filled 2023-06-11 – 2023-08-06 (×2): qty 180, 90d supply, fill #0
  Filled 2023-10-29: qty 180, 90d supply, fill #1

## 2023-06-11 MED ORDER — ATORVASTATIN CALCIUM 40 MG PO TABS
40.0000 mg | ORAL_TABLET | Freq: Every day | ORAL | 1 refills | Status: DC
  Filled 2023-06-11 – 2023-08-06 (×2): qty 90, 90d supply, fill #0
  Filled 2023-10-29: qty 90, 90d supply, fill #1

## 2023-06-11 MED ORDER — AMLODIPINE BESYLATE 10 MG PO TABS
10.0000 mg | ORAL_TABLET | Freq: Every day | ORAL | 0 refills | Status: DC
  Filled 2023-06-11 – 2023-08-06 (×2): qty 90, 90d supply, fill #0

## 2023-06-11 MED ORDER — ZOSTER VAC RECOMB ADJUVANTED 50 MCG/0.5ML IM SUSR
0.5000 mL | Freq: Once | INTRAMUSCULAR | 0 refills | Status: AC
Start: 2023-06-11 — End: 2023-06-12
  Filled 2023-06-11: qty 0.5, 1d supply, fill #0

## 2023-06-11 NOTE — Progress Notes (Signed)
Assessment & Plan:  Cody Porter was seen today for medical management of chronic issues.  Diagnoses and all orders for this visit:  Primary hypertension -     CMP14+EGFR -     amLODipine (NORVASC) 10 MG tablet; TAKE 1 TABLET (10 MG TOTAL) BY MOUTH DAILY. TO CONTROL BLOOD PRESSURE Continue all antihypertensives as prescribed.  Reminded to bring in blood pressure log for follow  up appointment.  RECOMMENDATIONS: DASH/Mediterranean Diets are healthier choices for HTN.    Prediabetes -     Hemoglobin A1c  Atrial fibrillation, unspecified type (HCC) Normal rate and rhythm -     aspirin EC 81 MG tablet; Take 1 tablet (81 mg total) by mouth daily. Patients states he has ASA 81 mg at home. Swallow whole. -     metoprolol tartrate (LOPRESSOR) 25 MG tablet; Take 1 tablet (25 mg total) by mouth 2 (two) times daily.  Dyslipidemia, goal LDL below 100 -     Lipid panel -     atorvastatin (LIPITOR) 40 MG tablet; Take 1 tablet (40 mg total) by mouth daily.  Abnormal CBC -     CBC with Differential  Urine frequency -     PSA  Need for shingles vaccine -     Zoster Vaccine Adjuvanted [facility]) injection; Inject 0.5 mLs into the muscle once for 1 dose.  Cigarette smoker -     CT CHEST LUNG CA SCREEN LOW DOSE W/O CM; Future Cody Porter was counseled on the dangers of tobacco use, and was advised to quit. Reviewed strategies to maximize success, including removing cigarettes and smoking materials from environment, stress management and support of family/friends as well as pharmacological alternatives including: Wellbutrin, Chantix, Nicotine patch, Nicotine gum or lozenges. Smoking cessation support: smoking cessation hotline: 1-800-QUIT-NOW.  Smoking cessation classes are also available through [facility]. Call [phone] or visit our website at [email or web address].   A total of 5 minutes was spent on counseling for smoking cessation and Cody Porter is not ready to quit.      Patient has been counseled on age-appropriate routine health concerns for screening and prevention. These are reviewed and up-to-date. Referrals have been placed accordingly. Immunizations are up-to-date or declined.    Subjective:   Chief Complaint  Patient presents with   Medical Management of Chronic Issues    Hypertension    HPI Cody Porter 62 y.o. male presents to office today for follow up to HTN  PMH of  ETOH abuse (01/28/2019), PAF, B/L Leg DVT (01/28/2019), IVC filter,  Pulmonary embolus (HCC) (01/28/2019), ICH (after being place don Eliquis for PE/DVT. Currently on ASA therapy. Continues to smoke cigarettes. Down to 1ppd from 1-2 ppd.    Patient has been counseled on age-appropriate routine health concerns for screening and prevention. These are reviewed and up-to-date. Referrals have been placed accordingly. Immunizations are up-to-date or declined.        HTN Blood pressure is well-controlled today with amlodipine 10 mg daily and Lopressor 25 mg twice daily. BP Readings from Last 3 Encounters:  06/11/23 120/74  12/08/22 124/72  06/07/22 110/75    Prediabetes A1c at goal of less than 6.5.  Currently controlled with diet only Lab Results  Component Value Date   HGBA1C 5.8 12/08/2022    Review of Systems  Constitutional:  Negative for fever, malaise/fatigue and weight loss.  HENT: Negative.  Negative for nosebleeds.   Eyes: Negative.  Negative for blurred vision, double vision and photophobia.  Respiratory:  Negative.  Negative for cough and shortness of breath.   Cardiovascular: Negative.  Negative for chest pain, palpitations and leg swelling.  Gastrointestinal: Negative.  Negative for heartburn, nausea and vomiting.  Genitourinary:  Positive for frequency. Negative for dysuria, flank pain, hematuria and urgency.  Musculoskeletal: Negative.  Negative for myalgias.  Neurological: Negative.  Negative for dizziness, focal weakness, seizures and headaches.   Psychiatric/Behavioral: Negative.  Negative for suicidal ideas.     Past Medical History:  Diagnosis Date   ETOH abuse 01/28/2019   Leg DVT (deep venous thromboembolism), acute, bilateral (HCC) 01/28/2019   Pulmonary embolus (HCC) 01/28/2019    Past Surgical History:  Procedure Laterality Date   HERNIA REPAIR     IR IVC FILTER PLMT / S&I /IMG GUID/MOD SED  02/25/2019   IR RADIOLOGIST EVAL & MGMT  07/17/2019   TONSILLECTOMY      Family History  Problem Relation Age of Onset   COPD Mother    Cancer Mother        Breast x2   Anuerysm Father     Social History Reviewed with no changes to be made today.   Outpatient Medications Prior to Visit  Medication Sig Dispense Refill   amLODipine (NORVASC) 10 MG tablet TAKE 1 TABLET (10 MG TOTAL) BY MOUTH DAILY. TO CONTROL BLOOD PRESSURE 90 tablet 0   aspirin EC 81 MG tablet Take 1 tablet (81 mg total) by mouth daily. Patients states he has ASA 81 mg at home. Swallow whole. 30 tablet 12   atorvastatin (LIPITOR) 40 MG tablet Take 1 tablet (40 mg total) by mouth daily. 90 tablet 1   metoprolol tartrate (LOPRESSOR) 25 MG tablet Take 1 tablet (25 mg total) by mouth 2 (two) times daily. 180 tablet 0   No facility-administered medications prior to visit.    No Known Allergies     Objective:    BP 120/74 (BP Location: Left Arm, Patient Position: Sitting, Cuff Size: Normal)   Pulse 62   Ht 5\' 7"  (1.702 m)   Wt 155 lb (70.3 kg)   SpO2 98%   BMI 24.28 kg/m  Wt Readings from Last 3 Encounters:  06/11/23 155 lb (70.3 kg)  12/08/22 159 lb (72.1 kg)  06/07/22 166 lb (75.3 kg)    Physical Exam Vitals and nursing note reviewed.  Constitutional:      Appearance: He is well-developed.  HENT:     Head: Normocephalic and atraumatic.  Cardiovascular:     Rate and Rhythm: Normal rate and regular rhythm.     Heart sounds: Normal heart sounds. No murmur heard.    No friction rub. No gallop.  Pulmonary:     Effort: Pulmonary effort is normal.  No tachypnea or respiratory distress.     Breath sounds: Normal breath sounds. No decreased breath sounds, wheezing, rhonchi or rales.  Chest:     Chest wall: No tenderness.  Abdominal:     General: Bowel sounds are normal.     Palpations: Abdomen is soft.  Musculoskeletal:        General: Normal range of motion.     Cervical back: Normal range of motion.  Skin:    General: Skin is warm and dry.  Neurological:     Mental Status: He is alert and oriented to person, place, and time.     Coordination: Coordination normal.  Psychiatric:        Behavior: Behavior normal. Behavior is cooperative.        Thought  Content: Thought content normal.        Judgment: Judgment normal.          Patient has been counseled extensively about nutrition and exercise as well as the importance of adherence with medications and regular follow-up. The patient was given clear instructions to go to ER or return to medical center if symptoms don't improve, worsen or new problems develop. The patient verbalized understanding.   Follow-up: Return in about 5 months (around 11/11/2023) for HTN prediabetes.   Claiborne Rigg, FNP-BC [facility] [place], [place] [phone]   06/11/2023, 2:59 PM

## 2023-08-06 ENCOUNTER — Other Ambulatory Visit: Payer: Self-pay

## 2023-08-08 ENCOUNTER — Other Ambulatory Visit: Payer: Self-pay

## 2023-08-22 ENCOUNTER — Telehealth: Payer: Self-pay | Admitting: Nurse Practitioner

## 2023-08-22 NOTE — Telephone Encounter (Signed)
Cody Porter with [employer] has called and states she can not update PCP with patient's [employer] in System. Cody Porter has tried the [facility] office NPI number, Cody Denver NP PCP NPI number & also Dr Rosey Bath (Supervising provider over Cody Denver NP) NPI number. Per Cody Porter, nothing comes up in system to update patients PCP with any of these methods.  Cody Porter would like someone to reach out to patient and update patient on information so he can relay this back to his insurance with  [employer]. Patients callback # [phone]  [facility] Callback # [phone]  (Member Services)

## 2023-10-29 ENCOUNTER — Other Ambulatory Visit: Payer: Self-pay

## 2023-10-29 ENCOUNTER — Other Ambulatory Visit: Payer: Self-pay | Admitting: Nurse Practitioner

## 2023-10-29 DIAGNOSIS — I10 Essential (primary) hypertension: Secondary | ICD-10-CM

## 2023-10-29 MED ORDER — AMLODIPINE BESYLATE 10 MG PO TABS
10.0000 mg | ORAL_TABLET | Freq: Every day | ORAL | 0 refills | Status: DC
Start: 2023-10-29 — End: 2023-11-12
  Filled 2023-10-29 – 2023-10-30 (×2): qty 30, 30d supply, fill #0

## 2023-10-30 ENCOUNTER — Other Ambulatory Visit: Payer: Self-pay

## 2023-11-12 ENCOUNTER — Encounter: Payer: Self-pay | Admitting: Nurse Practitioner

## 2023-11-12 ENCOUNTER — Ambulatory Visit: Payer: Medicaid Other | Attending: Nurse Practitioner | Admitting: Nurse Practitioner

## 2023-11-12 ENCOUNTER — Other Ambulatory Visit: Payer: Self-pay

## 2023-11-12 VITALS — BP 173/73 | HR 69 | Ht 67.0 in | Wt 156.0 lb

## 2023-11-12 DIAGNOSIS — I10 Essential (primary) hypertension: Secondary | ICD-10-CM

## 2023-11-12 DIAGNOSIS — K4090 Unilateral inguinal hernia, without obstruction or gangrene, not specified as recurrent: Secondary | ICD-10-CM | POA: Diagnosis not present

## 2023-11-12 DIAGNOSIS — I4891 Unspecified atrial fibrillation: Secondary | ICD-10-CM | POA: Diagnosis not present

## 2023-11-12 DIAGNOSIS — E785 Hyperlipidemia, unspecified: Secondary | ICD-10-CM | POA: Diagnosis not present

## 2023-11-12 MED ORDER — METOPROLOL TARTRATE 25 MG PO TABS
25.0000 mg | ORAL_TABLET | Freq: Two times a day (BID) | ORAL | 1 refills | Status: DC
  Filled 2023-11-12 – 2024-02-04 (×2): qty 180, 90d supply, fill #0
  Filled 2024-04-28 – 2024-05-08 (×2): qty 180, 90d supply, fill #1

## 2023-11-12 MED ORDER — AMLODIPINE BESYLATE 10 MG PO TABS
10.0000 mg | ORAL_TABLET | Freq: Every day | ORAL | 1 refills | Status: DC
Start: 2023-11-12 — End: 2024-05-12
  Filled 2023-11-12 – 2023-12-10 (×2): qty 90, 90d supply, fill #0
  Filled 2024-02-05 – 2024-03-03 (×2): qty 90, 90d supply, fill #1

## 2023-11-12 MED ORDER — ATORVASTATIN CALCIUM 40 MG PO TABS
40.0000 mg | ORAL_TABLET | Freq: Every day | ORAL | 1 refills | Status: DC
Start: 2023-11-12 — End: 2024-05-12
  Filled 2023-11-12 – 2024-02-04 (×2): qty 90, 90d supply, fill #0
  Filled 2024-04-28 – 2024-05-08 (×2): qty 90, 90d supply, fill #1

## 2023-11-12 MED ORDER — COVID-19 MRNA VAC-TRIS(PFIZER) 30 MCG/0.3ML IM SUSY
0.3000 mL | PREFILLED_SYRINGE | Freq: Once | INTRAMUSCULAR | 0 refills | Status: AC
  Filled 2023-11-12: qty 0.3, 1d supply, fill #0

## 2023-11-12 NOTE — Patient Instructions (Signed)
[facility]  Address: [address] [place], [place], [place] [address] Phone: [phone]

## 2023-11-12 NOTE — Progress Notes (Signed)
Assessment & Plan:  Cody Porter was seen today for medical management of chronic issues and hernia.  Diagnoses and all orders for this visit:  Primary hypertension Not at goal today. Instructions: dietary adherence -     amLODipine (NORVASC) 10 MG tablet; Take 1 tablet (10 mg total) by mouth daily. Continue all antihypertensives as prescribed.  Reminded to bring in blood pressure log for follow  up appointment.  RECOMMENDATIONS: DASH/Mediterranean Diets are healthier choices for HTN.    Dyslipidemia, goal LDL below 100 -     atorvastatin (LIPITOR) 40 MG tablet; Take 1 tablet (40 mg total) by mouth daily. INSTRUCTIONS: Work on a low fat, heart healthy diet and participate in regular aerobic exercise program by working out at least 150 minutes per week; 5 days a week-30 minutes per day. Avoid red meat/beef/steak,  fried foods. junk foods, sodas, sugary drinks, unhealthy snacking, alcohol and smoking.  Drink at least 80 oz of water per day and monitor your carbohydrate intake daily.    Atrial fibrillation, unspecified type (HCC) -     metoprolol tartrate (LOPRESSOR) 25 MG tablet; Take 1 tablet (25 mg total) by mouth 2 (two) times daily.  Right inguinal hernia -     Ambulatory referral to General Surgery    Patient has been counseled on age-appropriate routine health concerns for screening and prevention. These are reviewed and up-to-date. Referrals have been placed accordingly. Immunizations are up-to-date or declined.    Subjective:   Chief Complaint  Patient presents with   Medical Management of Chronic Issues   Hernia    Lower left abdomen     Cody Porter 62 y.o. male presents to office today for follow up to blood pressure and with concerns of left inguinal hernia.     HTN Blood pressure is elevated today. He endorses adherence taking amlodipine 10 mg daily and lopressor 25 mg BID. States he has been experiencing increased stress and was arguing with his mother prior to his  appt today.  BP Readings from Last 3 Encounters:  11/12/23 (!) 173/73  06/11/23 120/74  12/08/22 124/72     Inguinal Hernia: Patient presents for evaluation of left groin lump. Symptoms were first noted several weeks ago. Symptoms did not start at work. Pain is dull, intermittent. Lump is reducible. Pt has no symptoms of  chronic constipation, chronic cough, difficulty urinating. Pt. has previous history of right groin surgery (inguinal hernia).   Review of Systems  Constitutional:  Negative for fever, malaise/fatigue and weight loss.  HENT: Negative.  Negative for nosebleeds.   Eyes: Negative.  Negative for blurred vision, double vision and photophobia.  Respiratory: Negative.  Negative for cough and shortness of breath.   Cardiovascular: Negative.  Negative for chest pain, palpitations and leg swelling.  Gastrointestinal: Negative.  Negative for heartburn, nausea and vomiting.  Musculoskeletal: Negative.  Negative for myalgias.  Neurological: Negative.  Negative for dizziness, focal weakness, seizures and headaches.  Psychiatric/Behavioral: Negative.  Negative for suicidal ideas.     Past Medical History:  Diagnosis Date   ETOH abuse 01/28/2019   Leg DVT (deep venous thromboembolism), acute, bilateral (HCC) 01/28/2019   Pulmonary embolus (HCC) 01/28/2019    Past Surgical History:  Procedure Laterality Date   HERNIA REPAIR     IR IVC FILTER PLMT / S&I /IMG GUID/MOD SED  02/25/2019   IR RADIOLOGIST EVAL & MGMT  07/17/2019   TONSILLECTOMY      Family History  Problem Relation Age of Onset  COPD Mother    Cancer Mother        Breast x2   Anuerysm Father     Social History Reviewed with no changes to be made today.   Outpatient Medications Prior to Visit  Medication Sig Dispense Refill   aspirin EC 81 MG tablet Take 1 tablet (81 mg total) by mouth daily. Patients states he has ASA 81 mg at home. Swallow whole. 120 tablet 3   amLODipine (NORVASC) 10 MG tablet TAKE 1 TABLET (10  MG TOTAL) BY MOUTH DAILY. TO CONTROL BLOOD PRESSURE 30 tablet 0   atorvastatin (LIPITOR) 40 MG tablet Take 1 tablet (40 mg total) by mouth daily. 90 tablet 1   metoprolol tartrate (LOPRESSOR) 25 MG tablet Take 1 tablet (25 mg total) by mouth 2 (two) times daily. 180 tablet 1   No facility-administered medications prior to visit.    No Known Allergies     Objective:    BP (!) 173/73   Pulse 69   Ht 5\' 7"  (1.702 m)   Wt 156 lb (70.8 kg)   SpO2 98%   BMI 24.43 kg/m  Wt Readings from Last 3 Encounters:  11/12/23 156 lb (70.8 kg)  06/11/23 155 lb (70.3 kg)  12/08/22 159 lb (72.1 kg)    Physical Exam Vitals and nursing note reviewed.  Constitutional:      Appearance: He is well-developed.  HENT:     Head: Normocephalic and atraumatic.  Cardiovascular:     Rate and Rhythm: Normal rate and regular rhythm.     Heart sounds: Normal heart sounds. No murmur heard.    No friction rub. No gallop.  Pulmonary:     Effort: Pulmonary effort is normal. No tachypnea or respiratory distress.     Breath sounds: Normal breath sounds. No decreased breath sounds, wheezing, rhonchi or rales.  Chest:     Chest wall: No tenderness.  Abdominal:     General: Bowel sounds are normal.     Palpations: Abdomen is soft.     Hernia: A hernia is present. Hernia is present in the left inguinal area.  Musculoskeletal:        General: Normal range of motion.     Cervical back: Normal range of motion.  Skin:    General: Skin is warm and dry.  Neurological:     Mental Status: He is alert and oriented to person, place, and time.     Coordination: Coordination normal.  Psychiatric:        Behavior: Behavior normal. Behavior is cooperative.        Thought Content: Thought content normal.        Judgment: Judgment normal.          Patient has been counseled extensively about nutrition and exercise as well as the importance of adherence with medications and regular follow-up. The patient was given  clear instructions to go to ER or return to medical center if symptoms don't improve, worsen or new problems develop. The patient verbalized understanding.   Follow-up: Return in about 3 months (around 02/15/2024).   Claiborne Rigg, FNP-BC [facility] [place], [place] [phone]   11/12/2023, 2:58 PM

## 2023-12-10 ENCOUNTER — Other Ambulatory Visit: Payer: Self-pay

## 2023-12-20 ENCOUNTER — Other Ambulatory Visit: Payer: Self-pay | Admitting: Surgery

## 2023-12-31 ENCOUNTER — Encounter (HOSPITAL_BASED_OUTPATIENT_CLINIC_OR_DEPARTMENT_OTHER): Payer: Self-pay | Admitting: Surgery

## 2023-12-31 ENCOUNTER — Other Ambulatory Visit: Payer: Self-pay

## 2023-12-31 NOTE — Progress Notes (Signed)
   12/31/23 1607  PAT Phone Screen  Is the patient taking a GLP-1 receptor agonist? No  Do You Have Diabetes? No  Do You Have Hypertension? Yes  Have You Ever Been to the ER for Asthma? No  Have You Taken Oral Steroids in the Past 3 Months? No  Do you Take Phenteramine or any Other Diet Drugs? No  Recent  Lab Work, EKG, CXR? (S)  Yes  Do you have a history of heart problems? (S)  Yes (stroke, dvt, pe during covid 2020)  Have you ever had tests on your heart? Yes  What cardiac tests were performed? (S)  Echo  What date/year were cardiac tests completed? echo 2020 EF 60-65%  Results viewable: [facility] Media Tab  Any Recent Hospitalizations? No  Height 5\' 7"  (1.702 m)  Weight 72.6 kg  Pat Appointment Scheduled (S)  Yes (EKG)

## 2024-01-06 NOTE — H&P (Signed)
 REFERRING PHYSICIAN: Bertram Denver, NP PROVIDER: Wayne Both, MD MRN: [id number] DOB: 07/22/1961 DATE OF ENCOUNTER: 12/20/2023 Subjective   Chief Complaint: New Consultation (RIGHT INGUINAL HERNIA)  History of Present Illness: Cody Porter is a 63 y.o. male who is seen today as an office consultation for evaluation of New Consultation (RIGHT INGUINAL HERNIA)  This is a 63 year old gentleman who is referred here for evaluation of a left inguinal hernia. He has had a previous right inguinal hernia repair with mesh in the 90s. He has noticed the bulge for several months. He reports that it will easily reduce and does not cause discomfort. He is a heavy smoker. He was previously on Eliquis but no longer is. This is for a DVT. He is a poor historian  Review of Systems: A complete review of systems was obtained from the patient. I have reviewed this information and discussed as appropriate with the patient. See HPI as well for other ROS.  ROS   Medical History: Past Medical History:  Diagnosis Date  DVT (deep venous thrombosis) (CMS/HHS-HCC)  GERD (gastroesophageal reflux disease)  History of stroke  Hypertension   There is no problem list on file for this patient.  Past Surgical History:  Procedure Laterality Date  HERNIA REPAIR    No Known Allergies  Current Outpatient Medications on File Prior to Visit  Medication Sig Dispense Refill  amLODIPine (NORVASC) 10 MG tablet Take 1 tablet by mouth once daily  aspirin 81 MG EC tablet Take 81 mg by mouth  atorvastatin (LIPITOR) 40 MG tablet Take 40 mg by mouth once daily  metoprolol TARTrate (LOPRESSOR) 25 MG tablet Take 25 mg by mouth 2 (two) times daily   No current facility-administered medications on file prior to visit.   Family History  Problem Relation Age of Onset  Breast cancer Mother  Obesity Mother  High blood pressure (Hypertension) Mother  High blood pressure (Hypertension) Father  Coronary Artery Disease  (Blocked arteries around heart) Father  High blood pressure (Hypertension) Brother  Coronary Artery Disease (Blocked arteries around heart) Brother  Deep vein thrombosis (DVT or abnormal blood clot formation) Brother    Social History   Tobacco Use  Smoking Status Every Day  Types: Cigarettes  Smokeless Tobacco Not on file    Social History   Socioeconomic History  Marital status: Single  Tobacco Use  Smoking status: Every Day  Types: Cigarettes  Vaping Use  Vaping status: Unknown  Substance and Sexual Activity  Alcohol use: Not Currently  Drug use: Never   Objective:   Vitals:  12/20/23 1352  BP: 131/80  Pulse: 63  Temp: 36.4 C (97.5 F)  SpO2: 99%  Weight: 72.6 kg (160 lb)  Height: 170.2 cm (5\' 7" )  PainSc: 5   Body mass index is 25.06 kg/m.  Physical Exam   He appears well on exam but does smell of cigarette smoking  His abdomen is soft and nontender. There is an easily reducible umbilical hernia with a 1 cm fascial defect.  He has a moderate-sized, easily reducible, nontender left inguinal hernia  Labs, Imaging and Diagnostic Testing: I have reviewed the notes in the electronic medical records  Assessment and Plan:   Diagnoses and all orders for this visit:  Left inguinal hernia   At this point I discussed both his left inguinal hernia and umbilical hernia with him. He is not interested in having the umbilical hernia repaired and just wants to inguinal hernia repair with mesh. We did discuss worsening  of the hernia and risk of incarceration without surgery. I believe his issue is his chronic medical issues and his smoking. I explained surgical repair of his inguinal hernia which I would do open with hopefully LMA and tap block. I discussed the risks which include but is not limited to bleeding, infection, injury to surrounding structures, chronic pain, hernia recurrence, use of mesh, cardiopulmonary issues, blood clots, etc. He seems to understand and  wants to proceed with surgery as an outpatient.

## 2024-01-07 ENCOUNTER — Other Ambulatory Visit: Payer: Self-pay

## 2024-01-07 ENCOUNTER — Ambulatory Visit (HOSPITAL_BASED_OUTPATIENT_CLINIC_OR_DEPARTMENT_OTHER): Payer: Medicaid Other | Admitting: Anesthesiology

## 2024-01-07 ENCOUNTER — Ambulatory Visit (HOSPITAL_BASED_OUTPATIENT_CLINIC_OR_DEPARTMENT_OTHER)
Admission: RE | Admit: 2024-01-07 | Discharge: 2024-01-07 | Disposition: A | Discharge status: Home / Self Care | Payer: Medicaid Other | Attending: Surgery | Admitting: Surgery

## 2024-01-07 ENCOUNTER — Encounter (HOSPITAL_BASED_OUTPATIENT_CLINIC_OR_DEPARTMENT_OTHER): Payer: Self-pay | Admitting: Surgery

## 2024-01-07 ENCOUNTER — Encounter (HOSPITAL_BASED_OUTPATIENT_CLINIC_OR_DEPARTMENT_OTHER)
Admission: RE | Disposition: A | Discharge status: Home / Self Care | Payer: Self-pay | Source: Home / Self Care | Attending: Surgery

## 2024-01-07 DIAGNOSIS — Z86718 Personal history of other venous thrombosis and embolism: Secondary | ICD-10-CM | POA: Insufficient documentation

## 2024-01-07 DIAGNOSIS — I10 Essential (primary) hypertension: Secondary | ICD-10-CM | POA: Diagnosis not present

## 2024-01-07 DIAGNOSIS — K429 Umbilical hernia without obstruction or gangrene: Secondary | ICD-10-CM | POA: Diagnosis not present

## 2024-01-07 DIAGNOSIS — K4090 Unilateral inguinal hernia, without obstruction or gangrene, not specified as recurrent: Secondary | ICD-10-CM | POA: Insufficient documentation

## 2024-01-07 DIAGNOSIS — Z79899 Other long term (current) drug therapy: Secondary | ICD-10-CM | POA: Insufficient documentation

## 2024-01-07 DIAGNOSIS — F17210 Nicotine dependence, cigarettes, uncomplicated: Secondary | ICD-10-CM

## 2024-01-07 HISTORY — PX: INGUINAL HERNIA REPAIR: SHX194

## 2024-01-07 HISTORY — DX: Gastro-esophageal reflux disease without esophagitis: K21.9

## 2024-01-07 HISTORY — DX: Essential (primary) hypertension: I10

## 2024-01-07 SURGERY — REPAIR, HERNIA, INGUINAL, ADULT
Anesthesia: General | Site: Groin | Laterality: Left

## 2024-01-07 MED ORDER — BUPIVACAINE-EPINEPHRINE (PF) 0.5% -1:200000 IJ SOLN
INTRAMUSCULAR | Status: DC | PRN
  Administered 2024-01-07: 25 mL

## 2024-01-07 MED ORDER — MIDAZOLAM HCL 2 MG/2ML IJ SOLN
INTRAMUSCULAR | Status: AC
  Filled 2024-01-07: qty 2

## 2024-01-07 MED ORDER — ACETAMINOPHEN 500 MG PO TABS
1000.0000 mg | ORAL_TABLET | ORAL | Status: AC
  Administered 2024-01-07: 1000 mg via ORAL

## 2024-01-07 MED ORDER — PROPOFOL 10 MG/ML IV BOLUS
INTRAVENOUS | Status: DC | PRN
  Administered 2024-01-07: 200 mg via INTRAVENOUS

## 2024-01-07 MED ORDER — CHLORHEXIDINE GLUCONATE CLOTH 2 % EX PADS: 6.0000 | MEDICATED_PAD | Freq: Once | CUTANEOUS | Status: DC

## 2024-01-07 MED ORDER — CLONIDINE HCL (ANALGESIA) 100 MCG/ML EP SOLN
EPIDURAL | Status: DC | PRN
  Administered 2024-01-07: 50 ug

## 2024-01-07 MED ORDER — OXYCODONE HCL 5 MG/5ML PO SOLN: 5.0000 mg | Freq: Once | ORAL | Status: DC | PRN

## 2024-01-07 MED ORDER — BUPIVACAINE-EPINEPHRINE 0.5% -1:200000 IJ SOLN
INTRAMUSCULAR | Status: DC | PRN
  Administered 2024-01-07: 10 mL

## 2024-01-07 MED ORDER — ACETAMINOPHEN 500 MG PO TABS
ORAL_TABLET | ORAL | Status: AC
  Filled 2024-01-07: qty 2

## 2024-01-07 MED ORDER — CEFAZOLIN SODIUM-DEXTROSE 2-4 GM/100ML-% IV SOLN
2.0000 g | INTRAVENOUS | Status: AC
  Administered 2024-01-07: 2 g via INTRAVENOUS

## 2024-01-07 MED ORDER — FENTANYL CITRATE (PF) 100 MCG/2ML IJ SOLN
100.0000 ug | Freq: Once | INTRAMUSCULAR | Status: AC
  Administered 2024-01-07: 50 ug via INTRAVENOUS

## 2024-01-07 MED ORDER — FENTANYL CITRATE (PF) 100 MCG/2ML IJ SOLN
INTRAMUSCULAR | Status: AC
Start: 2024-01-07 — End: ?
  Filled 2024-01-07: qty 2

## 2024-01-07 MED ORDER — ONDANSETRON HCL 4 MG/2ML IJ SOLN
INTRAMUSCULAR | Status: DC | PRN
  Administered 2024-01-07: 4 mg via INTRAVENOUS

## 2024-01-07 MED ORDER — OXYCODONE HCL 5 MG PO TABS: 5.0000 mg | ORAL_TABLET | Freq: Once | ORAL | Status: DC | PRN

## 2024-01-07 MED ORDER — LIDOCAINE HCL (CARDIAC) PF 100 MG/5ML IV SOSY
PREFILLED_SYRINGE | INTRAVENOUS | Status: DC | PRN
  Administered 2024-01-07: 40 mg via INTRAVENOUS

## 2024-01-07 MED ORDER — CEFAZOLIN SODIUM-DEXTROSE 2-4 GM/100ML-% IV SOLN
INTRAVENOUS | Status: AC
  Filled 2024-01-07: qty 100

## 2024-01-07 MED ORDER — TRAMADOL HCL 50 MG PO TABS
50.0000 mg | ORAL_TABLET | Freq: Four times a day (QID) | ORAL | 0 refills | Status: DC | PRN
  Filled 2024-01-07: qty 20, 5d supply, fill #0

## 2024-01-07 MED ORDER — FENTANYL CITRATE (PF) 100 MCG/2ML IJ SOLN: 25.0000 ug | INTRAMUSCULAR | Status: DC | PRN

## 2024-01-07 MED ORDER — ENSURE PRE-SURGERY PO LIQD: 296.0000 mL | Freq: Once | ORAL | Status: DC

## 2024-01-07 MED ORDER — LACTATED RINGERS IV SOLN: INTRAVENOUS | Status: DC

## 2024-01-07 MED ORDER — 0.9 % SODIUM CHLORIDE (POUR BTL) OPTIME
TOPICAL | Status: DC | PRN
  Administered 2024-01-07: 500 mL

## 2024-01-07 MED ORDER — MIDAZOLAM HCL 2 MG/2ML IJ SOLN
2.0000 mg | Freq: Once | INTRAMUSCULAR | Status: AC
  Administered 2024-01-07: 1 mg via INTRAVENOUS

## 2024-01-07 MED ORDER — AMISULPRIDE (ANTIEMETIC) 5 MG/2ML IV SOLN: 10.0000 mg | Freq: Once | INTRAVENOUS | Status: DC | PRN

## 2024-01-07 MED ORDER — FENTANYL CITRATE (PF) 100 MCG/2ML IJ SOLN
INTRAMUSCULAR | Status: AC
  Filled 2024-01-07: qty 2

## 2024-01-07 SURGICAL SUPPLY — 35 items
BLADE CLIPPER SURG (BLADE) ×1 IMPLANT
BLADE SURG 15 STRL LF DISP TIS (BLADE) ×1 IMPLANT
CANISTER SUCT 1200ML W/VALVE (MISCELLANEOUS) IMPLANT
CHLORAPREP W/TINT 26 (MISCELLANEOUS) ×1 IMPLANT
COVER BACK TABLE 60X90IN (DRAPES) ×1 IMPLANT
COVER MAYO STAND STRL (DRAPES) ×1 IMPLANT
DERMABOND ADVANCED .7 DNX12 (GAUZE/BANDAGES/DRESSINGS) ×1 IMPLANT
DRAIN PENROSE .5X12 LATEX STL (DRAIN) ×1 IMPLANT
DRAPE LAPAROTOMY 100X72 PEDS (DRAPES) ×1 IMPLANT
DRAPE UTILITY XL STRL (DRAPES) ×1 IMPLANT
ELECT REM PT RETURN 9FT ADLT (ELECTROSURGICAL) ×1 IMPLANT
ELECTRODE REM PT RTRN 9FT ADLT (ELECTROSURGICAL) ×1 IMPLANT
GLOVE SURG SIGNA 7.5 PF LTX (GLOVE) ×1 IMPLANT
GOWN STRL REUS W/ TWL LRG LVL3 (GOWN DISPOSABLE) ×1 IMPLANT
GOWN STRL REUS W/ TWL XL LVL3 (GOWN DISPOSABLE) ×1 IMPLANT
MESH PARIETEX PROGRIP LEFT (Mesh General) IMPLANT
NDL HYPO 25X1 1.5 SAFETY (NEEDLE) ×1 IMPLANT
NEEDLE HYPO 25X1 1.5 SAFETY (NEEDLE) ×1 IMPLANT
NS IRRIG 1000ML POUR BTL (IV SOLUTION) IMPLANT
PACK BASIN DAY SURGERY FS (CUSTOM PROCEDURE TRAY) ×1 IMPLANT
PENCIL SMOKE EVACUATOR (MISCELLANEOUS) ×1 IMPLANT
SLEEVE SCD COMPRESS KNEE MED (STOCKING) ×1 IMPLANT
SPIKE FLUID TRANSFER (MISCELLANEOUS) IMPLANT
SPONGE INTESTINAL PEANUT (DISPOSABLE) IMPLANT
SPONGE T-LAP 4X18 ~~LOC~~+RFID (SPONGE) ×1 IMPLANT
SUT MNCRL AB 4-0 PS2 18 (SUTURE) ×1 IMPLANT
SUT SILK 2 0 SH (SUTURE) IMPLANT
SUT VIC AB 2-0 CT1 TAPERPNT 27 (SUTURE) ×2 IMPLANT
SUT VIC AB 3-0 CT1 27XBRD (SUTURE) ×1 IMPLANT
SYR 10ML LL (SYRINGE) IMPLANT
SYR BULB EAR ULCER 3OZ GRN STR (SYRINGE) IMPLANT
SYR CONTROL 10ML LL (SYRINGE) ×1 IMPLANT
TOWEL GREEN STERILE FF (TOWEL DISPOSABLE) ×1 IMPLANT
TUBE CONNECTING 20X1/4 (TUBING) IMPLANT
YANKAUER SUCT BULB TIP NO VENT (SUCTIONS) IMPLANT

## 2024-01-07 NOTE — Anesthesia Postprocedure Evaluation (Signed)
 Anesthesia Post Note  Patient: CHEYENNE SCHUMM  Procedure(s) Performed: OPEN LEFT INGUINAL HERNIA REPAIR WITH MESH (Left: Groin)     Patient location during evaluation: PACU Anesthesia Type: General Level of consciousness: awake and alert Pain management: pain level controlled Vital Signs Assessment: post-procedure vital signs reviewed and stable Respiratory status: spontaneous breathing, nonlabored ventilation, respiratory function stable and patient connected to nasal cannula oxygen Cardiovascular status: blood pressure returned to baseline and stable Postop Assessment: no apparent nausea or vomiting Anesthetic complications: no  No notable events documented.  Last Vitals:  Vitals:   01/07/24 1430 01/07/24 1500  BP: 117/67 127/69  Pulse: 65   Resp: 18 16  Temp:  36.6 C  SpO2: 95% 97%    Last Pain:  Vitals:   01/07/24 1500  TempSrc: Temporal  PainSc: 0-No pain                 Kennieth Rad

## 2024-01-07 NOTE — Transfer of Care (Signed)
 Immediate Anesthesia Transfer of Care Note  Patient: Cody Porter  Procedure(s) Performed: OPEN LEFT INGUINAL HERNIA REPAIR WITH MESH (Left: Groin)  Patient Location: PACU  Anesthesia Type:General  Level of Consciousness: awake, alert , oriented, and patient cooperative  Airway & Oxygen Therapy: Patient Spontanous Breathing  Post-op Assessment: Report given to RN and Post -op Vital signs reviewed and stable  Post vital signs: Reviewed and stable  Last Vitals:  Vitals Value Taken Time  BP 146/69 01/07/24 1410  Temp 36.6 C 01/07/24 1410  Pulse 73 01/07/24 1414  Resp 14 01/07/24 1414  SpO2 98 % 01/07/24 1414  Vitals shown include unfiled device data.  Last Pain:  Vitals:   01/07/24 1410  TempSrc:   PainSc: 1       Patients Stated Pain Goal: 6 (01/07/24 1155)  Complications: No notable events documented.

## 2024-01-07 NOTE — Anesthesia Preprocedure Evaluation (Signed)
 Anesthesia Evaluation  Patient identified by MRN, date of birth, ID band Patient awake    Reviewed: Allergy & Precautions, NPO status , Patient's Chart, lab work & pertinent test results  Airway Mallampati: II  TM Distance: >3 FB Neck ROM: Full    Dental   Pulmonary Current Smoker and Patient abstained from smoking.   Pulmonary exam normal        Cardiovascular hypertension, Pt. on medications and Pt. on home beta blockers + DVT  Normal cardiovascular exam     Neuro/Psych negative neurological ROS     GI/Hepatic ,GERD  ,,(+)     substance abuse  alcohol use  Endo/Other  negative endocrine ROS    Renal/GU negative Renal ROS     Musculoskeletal   Abdominal   Peds  Hematology negative hematology ROS (+)   Anesthesia Other Findings   Reproductive/Obstetrics                             Anesthesia Physical Anesthesia Plan  ASA: 3  Anesthesia Plan: General   Post-op Pain Management: Regional block* and Tylenol PO (pre-op)*   Induction: Intravenous  PONV Risk Score and Plan: 1 and Dexamethasone, Ondansetron, Midazolam and Treatment may vary due to age or medical condition  Airway Management Planned: LMA  Additional Equipment:   Intra-op Plan:   Post-operative Plan: Extubation in OR  Informed Consent: I have reviewed the patients History and Physical, chart, labs and discussed the procedure including the risks, benefits and alternatives for the proposed anesthesia with the patient or authorized representative who has indicated his/her understanding and acceptance.     Dental advisory given  Plan Discussed with: CRNA  Anesthesia Plan Comments:        Anesthesia Quick Evaluation

## 2024-01-07 NOTE — Anesthesia Procedure Notes (Addendum)
 Anesthesia Regional Block: TAP block   Pre-Anesthetic Checklist: , timeout performed,  Correct Patient, Correct Site, Correct Laterality,  Correct Procedure, Correct Position, site marked,  Risks and benefits discussed,  Surgical consent,  Pre-op evaluation,  At surgeon's request and post-op pain management  Laterality: Left  Prep: chloraprep       Needles:  Injection technique: Single-shot  Needle Type: Echogenic Needle     Needle Length: 9cm  Needle Gauge: 21     Additional Needles:   Procedures:,,,, ultrasound used (permanent image in chart),,    Narrative:  Start time: 01/07/2024 12:20 PM End time: 01/07/2024 12:26 PM Injection made incrementally with aspirations every 5 mL.  Performed by: Personally  Anesthesiologist: Marcene Duos, MD

## 2024-01-07 NOTE — Interval H&P Note (Signed)
 History and Physical Interval Note:no change in H and P  01/07/2024 11:44 AM  Cody Porter  has presented today for surgery, with the diagnosis of left inguinal hernia.  The various methods of treatment have been discussed with the patient and family. After consideration of risks, benefits and other options for treatment, the patient has consented to  Procedure(s) with comments: OPEN LEFT INGUINAL HERNIA REPAIR WITH MESH (Left) - LMA TAP BLOCK as a surgical intervention.  The patient's history has been reviewed, patient examined, no change in status, stable for surgery.  I have reviewed the patient's chart and labs.  Questions were answered to the patient's satisfaction.     Abigail Miyamoto

## 2024-01-07 NOTE — Op Note (Signed)
 OPEN LEFT INGUINAL HERNIA REPAIR WITH MESH  Procedure Note  Cody Porter 01/07/2024   Pre-op Diagnosis: left inguinal hernia     Post-op Diagnosis: same  Procedure(s): OPEN LEFT INGUINAL HERNIA REPAIR WITH MESH  Surgeon(s): Abigail Miyamoto, MD  Anesthesia: General  Staff:  Circulator: Macie Burows, RN; Griffin Basil, RN Scrub Person: Rolla Etienne  Estimated Blood Loss: Minimal               Findings: The patient was found to have a left indirect inguinal hernia which was repaired with a large piece of Prolene ProGrip mesh from [employer]  Procedure: The patient was brought to the operating room and identified as the correct patient.  She was placed supine on the operating room table and general anesthesia was induced.  His abdomen was prepped and draped in the usual sterile fashion.  I anesthetized the skin the left inguinal area with Marcaine and then made in a longitudinal incision with a scalpel.  I then dissected down through Scarpa's fascia with electrocautery.  The external oblique fascia was identified and opened toward the internal and external rings.  I controlled the patient's testicular cord and structures with a Penrose drain.  He had a small indirect hernia sac.  I dissected the sac down to the base.  All contents had been reduced.  I tied off the base of the sac with a 2-0 silk suture and then excised the redundant sac.  I then brought a large piece of Prolene ProGrip mesh onto the field.  The mesh was placed against the pubic tubercle and then brought around the cord structures and internal ring back to the pubic tubercle.  I sutured the mesh with 2-0 Vicryl sutures to the pubic tubercle as well as transversalis fascia.  I reinforced the internal ring with at the mesh site with 2-0 silk suture as well.  Wide coverage of the inguinal floor as well as coverage of the internal ring appeared to be achieved.  I then closed the external oblique fascia over the top of the  mesh with a running 2-0 Vicryl suture.  Scarpa's fascia was then closed interrupted 3-0 Vicryl sutures and the skin was closed with a running 4-0 Monocryl.  Dermabond was then applied.  The patient tolerated the procedure well.  All the counts were correct at the end of the procedure.  The patient was then extubated in the operating room and taken in a stable condition to the recovery room.          Abigail Miyamoto   Date: 01/07/2024  Time: 2:03 PM

## 2024-01-07 NOTE — Anesthesia Procedure Notes (Signed)
 Procedure Name: LMA Insertion Date/Time: 01/07/2024 1:22 PM  Performed by: Earmon Phoenix, CRNAPre-anesthesia Checklist: Patient identified, Emergency Drugs available, Patient being monitored and Suction available Patient Re-evaluated:Patient Re-evaluated prior to induction Oxygen Delivery Method: Circle system utilized Preoxygenation: Pre-oxygenation with 100% oxygen Induction Type: IV induction Ventilation: Mask ventilation without difficulty LMA: LMA inserted LMA Size: 4.0 Number of attempts: 1 Placement Confirmation: positive ETCO2 and breath sounds checked- equal and bilateral Tube secured with: Tape Dental Injury: Teeth and Oropharynx as per pre-operative assessment

## 2024-01-07 NOTE — Discharge Instructions (Addendum)
 [facility] _______[facility]  UMBILICAL OR INGUINAL HERNIA REPAIR: POST OP INSTRUCTIONS  Always review your discharge instruction sheet given to you by the facility where your surgery was performed. IF YOU HAVE DISABILITY OR FAMILY LEAVE FORMS, YOU MUST BRING THEM TO THE OFFICE FOR PROCESSING.   DO NOT GIVE THEM TO YOUR DOCTOR.  1. A  prescription for pain medication may be given to you upon discharge.  Take your pain medication as prescribed, if needed.  If narcotic pain medicine is not needed, then you may take acetaminophen (Tylenol) or ibuprofen (Advil) as needed. 2. Take your usually prescribed medications unless otherwise directed. If you need a refill on your pain medication, please contact your pharmacy.  They will contact our office to request authorization. Prescriptions will not be filled after 5 pm or on week-ends. 3. You should follow a light diet the first 24 hours after arrival home, such as soup and crackers, etc.  Be sure to include lots of fluids daily.  Resume your normal diet the day after surgery. 4.Most patients will experience some swelling and bruising around the umbilicus or in the groin and scrotum.  Ice packs and reclining will help.  Swelling and bruising can take several days to resolve.  6. It is common to experience some constipation if taking pain medication after surgery.  Increasing fluid intake and taking a stool softener (such as Colace) will usually help or prevent this problem from occurring.  A mild laxative (Milk of Magnesia or Miralax) should be taken according to package directions if there are no bowel movements after 48 hours. 7. Unless discharge instructions indicate otherwise, you may remove your bandages 24-48 hours after surgery, and you may shower at that time.  You may have steri-strips (small skin tapes) in place directly over the incision.  These strips should be left on the skin for 7-10 days.  If your surgeon used skin glue on the  incision, you may shower in 24 hours.  The glue will flake off over the next 2-3 weeks.  Any sutures or staples will be removed at the office during your follow-up visit. 8. ACTIVITIES:  You may resume regular (light) daily activities beginning the next day--such as daily self-care, walking, climbing stairs--gradually increasing activities as tolerated.  You may have sexual intercourse when it is comfortable.  Refrain from any heavy lifting or straining until approved by your doctor.  a.You may drive when you are no longer taking prescription pain medication, you can comfortably wear a seatbelt, and you can safely maneuver your car and apply brakes. b.RETURN TO WORK:   _____________________________________________  9.You should see your doctor in the office for a follow-up appointment approximately 2-3 weeks after your surgery.  Make sure that you call for this appointment within a day or two after you arrive home to insure a convenient appointment time. 10.OTHER INSTRUCTIONS: YOU MAY SHOWER STARTING TOMORROW ICE PACK, TYLENOL, AND IBUPROFEN ALSO FOR PAIN YOU MAY WALK UP AND DOWN STAIRS NO LIFTING MORE THAN 15 POUNDS FOR 4 WEEKS TAKE MIRALAX FOR CONSTIPATION    _____________________________________  WHEN TO CALL YOUR DOCTOR: Fever over 101.0 Inability to urinate Nausea and/or vomiting Extreme swelling or bruising Continued bleeding from incision. Increased pain, redness, or drainage from the incision  The clinic staff is available to answer your questions during regular business hours.  Please don't hesitate to call and ask to speak to one of the nurses for clinical concerns.  If you have a medical  emergency, go to the nearest emergency room or call 911.  A surgeon from [facility] is always on call at the hospital   [address], [address], [place], [place]  [address] ?  [address] [address], [place], [place]   [address] [phone] ? [phone] ? FAX [phone] Web  site: [email or web address]   Regional Anesthesia Blocks  1. You may not be able to move or feel the "blocked" extremity after a regional anesthetic block. This may last may last from 3-48 hours after placement, but it will go away. The length of time depends on the medication injected and your individual response to the medication. As the nerves start to wake up, you may experience tingling as the movement and feeling returns to your extremity. If the numbness and inability to move your extremity has not gone away after 48 hours, please call your surgeon.   2. The extremity that is blocked will need to be protected until the numbness is gone and the strength has returned. Because you cannot feel it, you will need to take extra care to avoid injury. Because it may be weak, you may have difficulty moving it or using it. You may not know what position it is in without looking at it while the block is in effect.  3. For blocks in the legs and feet, returning to weight bearing and walking needs to be done carefully. You will need to wait until the numbness is entirely gone and the strength has returned. You should be able to move your leg and foot normally before you try and bear weight or walk. You will need someone to be with you when you first try to ensure you do not fall and possibly risk injury.  4. Bruising and tenderness at the needle site are common side effects and will resolve in a few days.  5. Persistent numbness or new problems with movement should be communicated to the surgeon or the [facility] [phone] [facility] [phone]).   Post Anesthesia Home Care Instructions  Activity: Get plenty of rest for the remainder of the day. A responsible individual must stay with you for 24 hours following the procedure.  For the next 24 hours, DO NOT: -Drive a car -Advertising copywriter -Drink alcoholic beverages -Take any medication unless instructed  by your physician -Make any legal decisions or sign important papers.  Meals: Start with liquid foods such as gelatin or soup. Progress to regular foods as tolerated. Avoid greasy, spicy, heavy foods. If nausea and/or vomiting occur, drink only clear liquids until the nausea and/or vomiting subsides. Call your physician if vomiting continues.  Special Instructions/Symptoms: Your throat may feel dry or sore from the anesthesia or the breathing tube placed in your throat during surgery. If this causes discomfort, gargle with warm salt water. The discomfort should disappear within 24 hours.  If you had a scopolamine patch placed behind your ear for the management of post- operative nausea and/or vomiting:  1. The medication in the patch is effective for 72 hours, after which it should be removed.  Wrap patch in a tissue and discard in the trash. Wash hands thoroughly with soap and water. 2. You may remove the patch earlier than 72 hours if you experience unpleasant side effects which may include dry mouth, dizziness or visual disturbances. 3. Avoid touching the patch. Wash your hands with soap and water after contact with the patch.     Next dose of  Tylenol may be given at 6:00pm if needed.

## 2024-01-07 NOTE — Progress Notes (Signed)
 Assisted Dr. Casilda Carls with left, transabdominal plane, ultrasound guided block. Side rails up, monitors on throughout procedure. See vital signs in flow sheet. Tolerated Procedure well.

## 2024-01-08 ENCOUNTER — Encounter (HOSPITAL_BASED_OUTPATIENT_CLINIC_OR_DEPARTMENT_OTHER): Payer: Self-pay | Admitting: Surgery

## 2024-02-04 ENCOUNTER — Other Ambulatory Visit: Payer: Self-pay

## 2024-02-05 ENCOUNTER — Other Ambulatory Visit: Payer: Self-pay

## 2024-02-11 ENCOUNTER — Ambulatory Visit: Payer: Medicaid Other | Attending: Nurse Practitioner | Admitting: Nurse Practitioner

## 2024-02-11 ENCOUNTER — Encounter: Payer: Self-pay | Admitting: Nurse Practitioner

## 2024-02-11 VITALS — BP 137/75 | HR 67 | Resp 20 | Ht 67.0 in | Wt 160.6 lb

## 2024-02-11 DIAGNOSIS — R7989 Other specified abnormal findings of blood chemistry: Secondary | ICD-10-CM | POA: Diagnosis not present

## 2024-02-11 DIAGNOSIS — R7303 Prediabetes: Secondary | ICD-10-CM | POA: Diagnosis not present

## 2024-02-11 DIAGNOSIS — I10 Essential (primary) hypertension: Secondary | ICD-10-CM | POA: Diagnosis not present

## 2024-02-11 DIAGNOSIS — Z23 Encounter for immunization: Secondary | ICD-10-CM

## 2024-02-11 NOTE — Progress Notes (Signed)
 Assessment & Plan:  Cody Porter was seen today for medical management of chronic issues.  Diagnoses and all orders for this visit:  Primary hypertension -     CMP14+EGFR Continue all antihypertensives as prescribed.  Reminded to bring in blood pressure log for follow  up appointment.  RECOMMENDATIONS: DASH/Mediterranean Diets are healthier choices for HTN.    Prediabetes -     Hemoglobin A1c -     CMP14+EGFR Continue blood sugar control as discussed in office today, low carbohydrate diet, and regular physical exercise as tolerated, 150 minutes per week (30 min each day, 5 days per week, or 50 min 3 days per week). Keep blood sugar logs with fasting goal of 90-130 mg/dl, post prandial (after you eat) less than 180.  For Hypoglycemia: BS <60 and Hyperglycemia BS >400; contact the clinic ASAP. Annual eye exams and foot exams are recommended.   Abnormal CBC -     CBC with Differential  Need for shingles vaccine -     Varicella-zoster vaccine IM    Patient has been counseled on age-appropriate routine health concerns for screening and prevention. These are reviewed and up-to-date. Referrals have been placed accordingly. Immunizations are up-to-date or declined.    Subjective:   Chief Complaint  Patient presents with   Medical Management of Chronic Issues    Cody Porter 63 y.o. male presents to office today for follow up to HTN  S/p left inguinal hernia repair with mesh on 01-07-2024 Post op check with surgery 02-05-2024: He is doing well postoperatively and may now resume normal lifting. He does have a small umbilical hernia with a less than 1 cm fascial defect but he does not want repair of this currently. At this point, he may resume all normal activity including lifting. I will see him back as needed.    He has a past medical history of ETOH abuse (01/28/2019), GERD (gastroesophageal reflux disease), Hypertension, Leg DVT (deep venous thromboembolism), acute, bilateral (HCC)  (01/28/2019), and Pulmonary embolus (HCC) (01/28/2019).     HTN Blood pressure is well controled with amlodipine 10 mg daily and lopressor 25 mg BID.  He is doing well today. Denies any pain from surgical incision.  BP Readings from Last 3 Encounters:  02/11/24 137/75  01/07/24 127/69  11/12/23 (!) 173/73     Review of Systems  Constitutional:  Negative for fever, malaise/fatigue and weight loss.  HENT: Negative.  Negative for nosebleeds.   Eyes: Negative.  Negative for blurred vision, double vision and photophobia.  Respiratory: Negative.  Negative for cough and shortness of breath.   Cardiovascular: Negative.  Negative for chest pain, palpitations and leg swelling.  Gastrointestinal: Negative.  Negative for heartburn, nausea and vomiting.  Musculoskeletal: Negative.  Negative for myalgias.  Neurological: Negative.  Negative for dizziness, focal weakness, seizures and headaches.  Psychiatric/Behavioral: Negative.  Negative for suicidal ideas.     Past Medical History:  Diagnosis Date   ETOH abuse 01/28/2019   GERD (gastroesophageal reflux disease)    Hypertension    Leg DVT (deep venous thromboembolism), acute, bilateral (HCC) 01/28/2019   Pulmonary embolus (HCC) 01/28/2019    Past Surgical History:  Procedure Laterality Date   HERNIA REPAIR     INGUINAL HERNIA REPAIR Left 01/07/2024   Procedure: OPEN LEFT INGUINAL HERNIA REPAIR WITH MESH;  Surgeon: Abigail Miyamoto, MD;  Location: [facility];  Service: General;  Laterality: Left;  LMA TAP BLOCK   IR IVC FILTER PLMT / S&I Lenise Arena  GUID/MOD SED  02/25/2019   IR RADIOLOGIST EVAL & MGMT  07/17/2019   TONSILLECTOMY      Family History  Problem Relation Age of Onset   COPD Mother    Cancer Mother        Breast x2   Anuerysm Father     Social History Reviewed with no changes to be made today.   Outpatient Medications Prior to Visit  Medication Sig Dispense Refill   amLODipine (NORVASC) 10 MG tablet Take 1  tablet (10 mg total) by mouth daily. 90 tablet 1   aspirin EC 81 MG tablet Take 1 tablet (81 mg total) by mouth daily. Patients states he has ASA 81 mg at home. Swallow whole. 120 tablet 3   atorvastatin (LIPITOR) 40 MG tablet Take 1 tablet (40 mg total) by mouth daily. 90 tablet 1   metoprolol tartrate (LOPRESSOR) 25 MG tablet Take 1 tablet (25 mg total) by mouth 2 (two) times daily. 180 tablet 1   traMADol (ULTRAM) 50 MG tablet Take 1 tablet (50 mg total) by mouth every 6 (six) hours as needed for moderate pain (pain score 4-6) or severe pain (pain score 7-10). 25 tablet 0   No facility-administered medications prior to visit.    No Known Allergies     Objective:    BP 137/75 (BP Location: Left Arm, Patient Position: Sitting, Cuff Size: Normal)   Pulse 67   Resp 20   Ht 5\' 7"  (1.702 m)   Wt 160 lb 9.6 oz (72.8 kg)   SpO2 100%   BMI 25.15 kg/m  Wt Readings from Last 3 Encounters:  02/11/24 160 lb 9.6 oz (72.8 kg)  01/07/24 162 lb 7.7 oz (73.7 kg)  11/12/23 156 lb (70.8 kg)    Physical Exam Vitals and nursing note reviewed.  Constitutional:      Appearance: He is well-developed.  HENT:     Head: Normocephalic and atraumatic.  Cardiovascular:     Rate and Rhythm: Normal rate and regular rhythm.     Heart sounds: Normal heart sounds. No murmur heard.    No friction rub. No gallop.  Pulmonary:     Effort: Pulmonary effort is normal. No tachypnea or respiratory distress.     Breath sounds: Normal breath sounds. No decreased breath sounds, wheezing, rhonchi or rales.  Chest:     Chest wall: No tenderness.  Abdominal:     General: Bowel sounds are normal.     Palpations: Abdomen is soft.  Musculoskeletal:        General: Normal range of motion.     Cervical back: Normal range of motion.  Skin:    General: Skin is warm and dry.  Neurological:     Mental Status: He is alert and oriented to person, place, and time.     Coordination: Coordination normal.  Psychiatric:         Behavior: Behavior normal. Behavior is cooperative.        Thought Content: Thought content normal.        Judgment: Judgment normal.          Patient has been counseled extensively about nutrition and exercise as well as the importance of adherence with medications and regular follow-up. The patient was given clear instructions to go to ER or return to medical center if symptoms don't improve, worsen or new problems develop. The patient verbalized understanding.   Follow-up: Return in about 3 months (around 05/12/2024).   Claiborne Rigg, FNP-BC  [facility] [place], [place] [phone]   02/11/2024, 3:06 PM

## 2024-02-12 LAB — CBC WITH DIFFERENTIAL/PLATELET
Basophils Absolute: 0.1 10*3/uL (ref 0.0–0.2)
Basos: 1 %
EOS (ABSOLUTE): 0.5 10*3/uL — ABNORMAL HIGH (ref 0.0–0.4)
Eos: 6 %
Hematocrit: 39.4 % (ref 37.5–51.0)
Hemoglobin: 13.4 g/dL (ref 13.0–17.7)
Immature Grans (Abs): 0 10*3/uL (ref 0.0–0.1)
Immature Granulocytes: 0 %
Lymphocytes Absolute: 2.3 10*3/uL (ref 0.7–3.1)
Lymphs: 28 %
MCH: 31.1 pg (ref 26.6–33.0)
MCHC: 34 g/dL (ref 31.5–35.7)
MCV: 91 fL (ref 79–97)
Monocytes Absolute: 0.7 10*3/uL (ref 0.1–0.9)
Monocytes: 9 %
Neutrophils Absolute: 4.5 10*3/uL (ref 1.4–7.0)
Neutrophils: 56 %
Platelets: 293 10*3/uL (ref 150–450)
RBC: 4.31 x10E6/uL (ref 4.14–5.80)
RDW: 12.3 % (ref 11.6–15.4)
WBC: 8.1 10*3/uL (ref 3.4–10.8)

## 2024-02-12 LAB — CMP14+EGFR
ALT: 13 IU/L (ref 0–44)
AST: 17 IU/L (ref 0–40)
Albumin: 4.8 g/dL (ref 3.9–4.9)
Alkaline Phosphatase: 65 IU/L (ref 44–121)
BUN/Creatinine Ratio: 8 — ABNORMAL LOW (ref 10–24)
BUN: 13 mg/dL (ref 8–27)
Bilirubin Total: 0.3 mg/dL (ref 0.0–1.2)
CO2: 25 mmol/L (ref 20–29)
Calcium: 9.6 mg/dL (ref 8.6–10.2)
Chloride: 102 mmol/L (ref 96–106)
Creatinine, Ser: 1.63 mg/dL — ABNORMAL HIGH (ref 0.76–1.27)
Globulin, Total: 2.3 g/dL (ref 1.5–4.5)
Glucose: 94 mg/dL (ref 70–99)
Potassium: 4.4 mmol/L (ref 3.5–5.2)
Sodium: 140 mmol/L (ref 134–144)
Total Protein: 7.1 g/dL (ref 6.0–8.5)
eGFR: 47 mL/min/{1.73_m2} — ABNORMAL LOW (ref >59)

## 2024-02-12 LAB — HEMOGLOBIN A1C
Est. average glucose Bld gHb Est-mCnc: 123 mg/dL
Hgb A1c MFr Bld: 5.9 % — ABNORMAL HIGH (ref 4.8–5.6)

## 2024-03-10 ENCOUNTER — Other Ambulatory Visit: Payer: Self-pay

## 2024-05-07 ENCOUNTER — Other Ambulatory Visit: Payer: Self-pay

## 2024-05-12 ENCOUNTER — Encounter: Payer: Self-pay | Admitting: Nurse Practitioner

## 2024-05-12 ENCOUNTER — Other Ambulatory Visit: Payer: Self-pay

## 2024-05-12 ENCOUNTER — Ambulatory Visit: Attending: Nurse Practitioner | Admitting: Nurse Practitioner

## 2024-05-12 VITALS — BP 110/72 | HR 67 | Resp 19 | Ht 67.0 in | Wt 165.4 lb

## 2024-05-12 DIAGNOSIS — I4891 Unspecified atrial fibrillation: Secondary | ICD-10-CM | POA: Diagnosis not present

## 2024-05-12 DIAGNOSIS — E785 Hyperlipidemia, unspecified: Secondary | ICD-10-CM

## 2024-05-12 DIAGNOSIS — I10 Essential (primary) hypertension: Secondary | ICD-10-CM | POA: Diagnosis not present

## 2024-05-12 MED ORDER — ATORVASTATIN CALCIUM 40 MG PO TABS
40.0000 mg | ORAL_TABLET | Freq: Every day | ORAL | 1 refills | Status: DC
  Filled 2024-05-12 – 2024-08-04 (×2): qty 90, 90d supply, fill #0

## 2024-05-12 MED ORDER — METOPROLOL TARTRATE 25 MG PO TABS
25.0000 mg | ORAL_TABLET | Freq: Two times a day (BID) | ORAL | 1 refills | Status: DC
  Filled 2024-05-12 – 2024-08-04 (×2): qty 180, 90d supply, fill #0

## 2024-05-12 MED ORDER — AMLODIPINE BESYLATE 10 MG PO TABS
10.0000 mg | ORAL_TABLET | Freq: Every day | ORAL | 1 refills | Status: DC
  Filled 2024-05-12 – 2024-06-02 (×2): qty 90, 90d supply, fill #0

## 2024-05-12 NOTE — Patient Instructions (Signed)
 [address] [place], [place], [place] [address] Areas served: [place] and nearby areas Phone: [phone]

## 2024-05-12 NOTE — Progress Notes (Addendum)
 Assessment & Plan:  Cody Porter was seen today for hypertension.  Diagnoses and all orders for this visit:  Primary hypertension -     CMP14+EGFR Continue all antihypertensives as prescribed.  Reminded to bring in blood pressure log for follow  up appointment.  RECOMMENDATIONS: DASH/Mediterranean Diets are healthier choices for HTN.      Patient has been counseled on age-appropriate routine health concerns for screening and prevention. These are reviewed and up-to-date. Referrals have been placed accordingly. Immunizations are up-to-date or declined.    Subjective:   Chief Complaint  Patient presents with   Hypertension    Cody Porter 63 y.o. male presents to office today for follow up to HTN  He has a past medical history of ETOH abuse (01/28/2019), GERD (gastroesophageal reflux disease), Hypertension, Leg DVT (deep venous thromboembolism), acute, bilateral (HCC) (01/28/2019), and Pulmonary embolus (HCC) (01/28/2019).    Blood pressure is well-controlled today.  He is taking metoprolol  25 mg BID and amlodipine  10 mg daily as prescribed.  He does continue to smoke cigarettes and has an upcoming lung CT in 2 days. BP Readings from Last 3 Encounters:  05/12/24 110/72  02/11/24 137/75  01/07/24 127/69     Review of Systems  Constitutional:  Negative for fever, malaise/fatigue and weight loss.  HENT: Negative.  Negative for nosebleeds.   Eyes: Negative.  Negative for blurred vision, double vision and photophobia.  Respiratory: Negative.  Negative for cough and shortness of breath.   Cardiovascular: Negative.  Negative for chest pain, palpitations and leg swelling.  Gastrointestinal: Negative.  Negative for heartburn, nausea and vomiting.  Musculoskeletal: Negative.  Negative for myalgias.  Neurological: Negative.  Negative for dizziness, focal weakness, seizures and headaches.  Psychiatric/Behavioral: Negative.  Negative for suicidal ideas.     Past Medical History:   Diagnosis Date   ETOH abuse 01/28/2019   GERD (gastroesophageal reflux disease)    Hypertension    Leg DVT (deep venous thromboembolism), acute, bilateral (HCC) 01/28/2019   Pulmonary embolus (HCC) 01/28/2019    Past Surgical History:  Procedure Laterality Date   HERNIA REPAIR     INGUINAL HERNIA REPAIR Left 01/07/2024   Procedure: OPEN LEFT INGUINAL HERNIA REPAIR WITH MESH;  Surgeon: Vernetta Berg, MD;  Location: [facility];  Service: General;  Laterality: Left;  LMA TAP BLOCK   IR IVC FILTER PLMT / S&I /IMG GUID/MOD SED  02/25/2019   IR RADIOLOGIST EVAL & MGMT  07/17/2019   TONSILLECTOMY      Family History  Problem Relation Age of Onset   COPD Mother    Cancer Mother        Breast x2   Anuerysm Father     Social History Reviewed with no changes to be made today.   Outpatient Medications Prior to Visit  Medication Sig Dispense Refill   aspirin  EC 81 MG tablet Take 1 tablet (81 mg total) by mouth daily. Patients states he has ASA 81 mg at home. Swallow whole. 120 tablet 3   amLODipine  (NORVASC ) 10 MG tablet Take 1 tablet (10 mg total) by mouth daily. (Patient not taking: Reported on 05/12/2024) 90 tablet 1   atorvastatin  (LIPITOR) 40 MG tablet Take 1 tablet (40 mg total) by mouth daily. 90 tablet 1   metoprolol  tartrate (LOPRESSOR ) 25 MG tablet Take 1 tablet (25 mg total) by mouth 2 (two) times daily. 180 tablet 1   traMADol  (ULTRAM ) 50 MG tablet Take 1 tablet (50 mg total) by mouth every  6 (six) hours as needed for moderate pain (pain score 4-6) or severe pain (pain score 7-10). (Patient not taking: Reported on 05/12/2024) 25 tablet 0   No facility-administered medications prior to visit.    No Known Allergies     Objective:    BP 110/72 (BP Location: Left Arm, Patient Position: Sitting, Cuff Size: Normal)   Pulse 67   Resp 19   Ht 5' 7 (1.702 m)   Wt 165 lb 6.4 oz (75 kg)   SpO2 99%   BMI 25.91 kg/m  Wt Readings from Last 3 Encounters:   05/12/24 165 lb 6.4 oz (75 kg)  02/11/24 160 lb 9.6 oz (72.8 kg)  01/07/24 162 lb 7.7 oz (73.7 kg)    Physical Exam Vitals and nursing note reviewed.  Constitutional:      Appearance: He is well-developed.  HENT:     Head: Normocephalic and atraumatic.   Cardiovascular:     Rate and Rhythm: Normal rate and regular rhythm.     Heart sounds: Normal heart sounds. No murmur heard.    No friction rub. No gallop.  Pulmonary:     Effort: Pulmonary effort is normal. No tachypnea or respiratory distress.     Breath sounds: Normal breath sounds. No decreased breath sounds, wheezing, rhonchi or rales.  Chest:     Chest wall: No tenderness.  Abdominal:     General: Bowel sounds are normal.     Palpations: Abdomen is soft.   Musculoskeletal:        General: Normal range of motion.     Cervical back: Normal range of motion.   Skin:    General: Skin is warm and dry.   Neurological:     Mental Status: He is alert and oriented to person, place, and time.     Coordination: Coordination normal.   Psychiatric:        Behavior: Behavior normal. Behavior is cooperative.        Thought Content: Thought content normal.        Judgment: Judgment normal.          Patient has been counseled extensively about nutrition and exercise as well as the importance of adherence with medications and regular follow-up. The patient was given clear instructions to go to ER or return to medical center if symptoms don't improve, worsen or new problems develop. The patient verbalized understanding.   Follow-up: Return in about 3 months (around 08/15/2024).   [name], FNP-BC [facility] [place], [place] [phone]   05/12/2024, 2:05 PM

## 2024-05-13 ENCOUNTER — Encounter: Payer: Self-pay | Admitting: Nurse Practitioner

## 2024-05-13 LAB — CMP14+EGFR
ALT: 13 IU/L (ref 0–44)
AST: 16 IU/L (ref 0–40)
Albumin: 4.8 g/dL (ref 3.9–4.9)
Alkaline Phosphatase: 58 IU/L (ref 44–121)
BUN/Creatinine Ratio: 11 (ref 10–24)
BUN: 18 mg/dL (ref 8–27)
Bilirubin Total: 0.4 mg/dL (ref 0.0–1.2)
CO2: 22 mmol/L (ref 20–29)
Calcium: 9.6 mg/dL (ref 8.6–10.2)
Chloride: 102 mmol/L (ref 96–106)
Creatinine, Ser: 1.57 mg/dL — ABNORMAL HIGH (ref 0.76–1.27)
Globulin, Total: 2.1 g/dL (ref 1.5–4.5)
Glucose: 90 mg/dL (ref 70–99)
Potassium: 4.5 mmol/L (ref 3.5–5.2)
Sodium: 139 mmol/L (ref 134–144)
Total Protein: 6.9 g/dL (ref 6.0–8.5)
eGFR: 49 mL/min/{1.73_m2} — ABNORMAL LOW (ref >59)

## 2024-05-14 ENCOUNTER — Ambulatory Visit: Payer: Self-pay | Admitting: Nurse Practitioner

## 2024-05-14 ENCOUNTER — Ambulatory Visit
Admission: RE | Admit: 2024-05-14 | Discharge: 2024-05-14 | Disposition: A | Discharge status: Home / Self Care | Source: Ambulatory Visit | Attending: Nurse Practitioner | Admitting: Nurse Practitioner

## 2024-05-14 DIAGNOSIS — F17210 Nicotine dependence, cigarettes, uncomplicated: Secondary | ICD-10-CM

## 2024-05-15 ENCOUNTER — Ambulatory Visit: Payer: Self-pay | Admitting: Nurse Practitioner

## 2024-05-26 ENCOUNTER — Ambulatory Visit: Payer: Self-pay | Admitting: Nurse Practitioner

## 2024-05-29 NOTE — Telephone Encounter (Signed)
 Copied from CRM [id number]. Topic: General - Call Back - No Documentation >> May 29, 2024 10:24 AM DeAngela L wrote:  Reason for CRM: pt thinks they missed a call from the office but there are no notes in the chart, the patient is waiting for a return call if the office did call today   Pt num [phone] (M)

## 2024-06-02 ENCOUNTER — Other Ambulatory Visit: Payer: Self-pay

## 2024-06-05 ENCOUNTER — Other Ambulatory Visit: Payer: Self-pay

## 2024-06-06 ENCOUNTER — Other Ambulatory Visit: Payer: Self-pay

## 2024-08-04 ENCOUNTER — Other Ambulatory Visit: Payer: Self-pay

## 2024-08-11 ENCOUNTER — Other Ambulatory Visit: Payer: Self-pay

## 2024-08-12 ENCOUNTER — Ambulatory Visit: Admitting: Nurse Practitioner

## 2024-08-13 ENCOUNTER — Telehealth: Payer: Self-pay | Admitting: Nurse Practitioner

## 2024-08-13 NOTE — Telephone Encounter (Signed)
 Pt confirmed appt 10/1 (per vr )

## 2024-08-15 ENCOUNTER — Encounter: Payer: Self-pay | Admitting: Nurse Practitioner

## 2024-08-15 ENCOUNTER — Ambulatory Visit: Attending: Nurse Practitioner | Admitting: Nurse Practitioner

## 2024-08-15 ENCOUNTER — Other Ambulatory Visit: Payer: Self-pay

## 2024-08-15 VITALS — BP 141/85 | HR 58 | Resp 19 | Ht 67.0 in | Wt 170.0 lb

## 2024-08-15 DIAGNOSIS — I4891 Unspecified atrial fibrillation: Secondary | ICD-10-CM | POA: Diagnosis not present

## 2024-08-15 DIAGNOSIS — Z23 Encounter for immunization: Secondary | ICD-10-CM | POA: Diagnosis not present

## 2024-08-15 DIAGNOSIS — E785 Hyperlipidemia, unspecified: Secondary | ICD-10-CM

## 2024-08-15 DIAGNOSIS — I10 Essential (primary) hypertension: Secondary | ICD-10-CM | POA: Diagnosis not present

## 2024-08-15 DIAGNOSIS — Z79899 Other long term (current) drug therapy: Secondary | ICD-10-CM | POA: Diagnosis not present

## 2024-08-15 MED ORDER — AMLODIPINE BESYLATE 10 MG PO TABS
10.0000 mg | ORAL_TABLET | Freq: Every day | ORAL | 1 refills | Status: DC
  Filled 2024-08-15 – 2024-09-01 (×2): qty 90, 90d supply, fill #0
  Filled 2024-11-21: qty 90, 90d supply, fill #1

## 2024-08-15 MED ORDER — METOPROLOL TARTRATE 25 MG PO TABS
25.0000 mg | ORAL_TABLET | Freq: Two times a day (BID) | ORAL | 1 refills | Status: AC
  Filled 2024-08-15 – 2024-11-03 (×2): qty 180, 90d supply, fill #0

## 2024-08-15 MED ORDER — ATORVASTATIN CALCIUM 40 MG PO TABS
40.0000 mg | ORAL_TABLET | Freq: Every day | ORAL | 1 refills | Status: AC
  Filled 2024-08-15 – 2024-11-03 (×2): qty 90, 90d supply, fill #0

## 2024-08-15 NOTE — Progress Notes (Signed)
 Assessment & Plan:  Cody Porter was seen today for hypertension.  Diagnoses and all orders for this visit:  Primary hypertension -     amLODipine  (NORVASC ) 10 MG tablet; Take 1 tablet (10 mg total) by mouth daily. Continue all antihypertensives as prescribed.  Reminded to bring in blood pressure log for follow  up appointment.  RECOMMENDATIONS: DASH/Mediterranean Diets are healthier choices for HTN.    Need for influenza vaccination -     Flu vaccine trivalent PF, 6mos and older(Flulaval,Afluria,Fluarix,Fluzone)  Dyslipidemia, goal LDL below 100 -     atorvastatin  (LIPITOR) 40 MG tablet; Take 1 tablet (40 mg total) by mouth daily. INSTRUCTIONS: Work on a low fat, heart healthy diet and participate in regular aerobic exercise program by working out at least 150 minutes per week; 5 days a week-30 minutes per day. Avoid red meat/beef/steak,  fried foods. junk foods, sodas, sugary drinks, unhealthy snacking, alcohol and smoking.  Drink at least 80 oz of water per day and monitor your carbohydrate intake daily.    Atrial fibrillation, unspecified type (HCC) -     metoprolol  tartrate (LOPRESSOR ) 25 MG tablet; Take 1 tablet (25 mg total) by mouth 2 (two) times daily.    Patient has been counseled on age-appropriate routine health concerns for screening and prevention. These are reviewed and up-to-date. Referrals have been placed accordingly. Immunizations are up-to-date or declined.    Subjective:   Chief Complaint  Patient presents with   Hypertension    Cody Porter 63 y.o. male presents to office today   He has a past medical history of ETOH abuse (01/28/2019), GERD (gastroesophageal reflux disease), Hypertension, Leg DVT (deep venous thromboembolism), acute, bilateral (HCC) (01/28/2019), and Pulmonary embolus (HCC) (01/28/2019).      Blood pressure is slightly elevated today. Cody Porter  He is taking metoprolol  25 mg BID and amlodipine  10 mg daily as prescribed. He did smoke several  cigarettes prior to his appt today.   Most recent lung CT normal aside from non obstructing kidney stones.  BP Readings from Last 3 Encounters:  08/15/24 (!) 141/85  05/12/24 110/72  02/11/24 137/75      Review of Systems  Constitutional:  Negative for fever, malaise/fatigue and weight loss.  HENT: Negative.  Negative for nosebleeds.   Eyes: Negative.  Negative for blurred vision, double vision and photophobia.  Respiratory: Negative.  Negative for cough and shortness of breath.   Cardiovascular: Negative.  Negative for chest pain, palpitations and leg swelling.  Gastrointestinal: Negative.  Negative for heartburn, nausea and vomiting.  Musculoskeletal: Negative.  Negative for myalgias.  Neurological: Negative.  Negative for dizziness, focal weakness, seizures and headaches.  Psychiatric/Behavioral: Negative.  Negative for suicidal ideas.     Past Medical History:  Diagnosis Date   ETOH abuse 01/28/2019   GERD (gastroesophageal reflux disease)    Hypertension    Leg DVT (deep venous thromboembolism), acute, bilateral (HCC) 01/28/2019   Pulmonary embolus (HCC) 01/28/2019    Past Surgical History:  Procedure Laterality Date   HERNIA REPAIR     INGUINAL HERNIA REPAIR Left 01/07/2024   Procedure: OPEN LEFT INGUINAL HERNIA REPAIR WITH MESH;  Surgeon: Vernetta Berg, MD;  Location: [facility];  Service: General;  Laterality: Left;  LMA TAP BLOCK   IR IVC FILTER PLMT / S&I /IMG GUID/MOD SED  02/25/2019   IR RADIOLOGIST EVAL & MGMT  07/17/2019   TONSILLECTOMY      Family History  Problem Relation Age of Onset  COPD Mother    Cancer Mother        Breast x2   Anuerysm Father     Social History Reviewed with no changes to be made today.   Outpatient Medications Prior to Visit  Medication Sig Dispense Refill   aspirin  EC 81 MG tablet Take 1 tablet (81 mg total) by mouth daily. Patients states he has ASA 81 mg at home. Swallow whole. 120 tablet 3   amLODipine   (NORVASC ) 10 MG tablet Take 1 tablet (10 mg total) by mouth daily. 90 tablet 1   atorvastatin  (LIPITOR) 40 MG tablet Take 1 tablet (40 mg total) by mouth daily. 90 tablet 1   metoprolol  tartrate (LOPRESSOR ) 25 MG tablet Take 1 tablet (25 mg total) by mouth 2 (two) times daily. 180 tablet 1   No facility-administered medications prior to visit.    No Known Allergies     Objective:    BP (!) 141/85 (BP Location: Left Arm, Patient Position: Sitting, Cuff Size: Normal)   Pulse (!) 58   Resp 19   Ht 5' 7 (1.702 m)   Wt 170 lb (77.1 kg)   SpO2 100%   BMI 26.63 kg/m  Wt Readings from Last 3 Encounters:  08/15/24 170 lb (77.1 kg)  05/12/24 165 lb 6.4 oz (75 kg)  02/11/24 160 lb 9.6 oz (72.8 kg)    Physical Exam Vitals and nursing note reviewed.  Constitutional:      Appearance: He is well-developed.  HENT:     Head: Normocephalic and atraumatic.  Cardiovascular:     Rate and Rhythm: Normal rate and regular rhythm.     Heart sounds: Normal heart sounds. No murmur heard.    No friction rub. No gallop.  Pulmonary:     Effort: Pulmonary effort is normal. No tachypnea or respiratory distress.     Breath sounds: Normal breath sounds. No decreased breath sounds, wheezing, rhonchi or rales.  Chest:     Chest wall: No tenderness.  Abdominal:     General: Bowel sounds are normal.     Palpations: Abdomen is soft.  Musculoskeletal:        General: Normal range of motion.     Cervical back: Normal range of motion.  Skin:    General: Skin is warm and dry.  Neurological:     Mental Status: He is alert and oriented to person, place, and time.     Coordination: Coordination normal.  Psychiatric:        Behavior: Behavior normal. Behavior is cooperative.        Thought Content: Thought content normal.        Judgment: Judgment normal.          Patient has been counseled extensively about nutrition and exercise as well as the importance of adherence with medications and regular  follow-up. The patient was given clear instructions to go to ER or return to medical center if symptoms don't improve, worsen or new problems develop. The patient verbalized understanding.   Follow-up: Return in about 14 weeks (around 11/21/2024).   Cody LELON Servant, FNP-BC [facility] [place], [place] [phone]   08/15/2024, 2:02 PM

## 2024-09-01 ENCOUNTER — Other Ambulatory Visit: Payer: Self-pay

## 2024-09-03 ENCOUNTER — Other Ambulatory Visit: Payer: Self-pay

## 2024-11-03 ENCOUNTER — Other Ambulatory Visit: Payer: Self-pay

## 2024-11-04 ENCOUNTER — Other Ambulatory Visit (HOSPITAL_COMMUNITY): Payer: Self-pay

## 2024-11-04 ENCOUNTER — Other Ambulatory Visit: Payer: Self-pay

## 2024-11-19 ENCOUNTER — Telehealth: Payer: Self-pay | Admitting: Nurse Practitioner

## 2024-11-19 NOTE — Telephone Encounter (Signed)
 Pt unconfirmed appt phone not working

## 2024-11-21 ENCOUNTER — Ambulatory Visit: Attending: Nurse Practitioner | Admitting: Nurse Practitioner

## 2024-11-21 ENCOUNTER — Encounter: Payer: Self-pay | Admitting: Nurse Practitioner

## 2024-11-21 ENCOUNTER — Other Ambulatory Visit: Payer: Self-pay

## 2024-11-21 VITALS — BP 131/77 | HR 60 | Ht 67.0 in | Wt 178.0 lb

## 2024-11-21 DIAGNOSIS — I10 Essential (primary) hypertension: Secondary | ICD-10-CM

## 2024-11-21 DIAGNOSIS — R7303 Prediabetes: Secondary | ICD-10-CM | POA: Diagnosis not present

## 2024-11-21 DIAGNOSIS — F17210 Nicotine dependence, cigarettes, uncomplicated: Secondary | ICD-10-CM

## 2024-11-21 DIAGNOSIS — R350 Frequency of micturition: Secondary | ICD-10-CM

## 2024-11-21 MED ORDER — AMLODIPINE BESYLATE 10 MG PO TABS: 10.0000 mg | ORAL_TABLET | Freq: Every day | ORAL | 1 refills | Status: AC

## 2024-11-21 NOTE — Progress Notes (Signed)
 "  Assessment & Plan:  Cody Porter was seen today for hypertension.  Diagnoses and all orders for this visit:  Primary hypertension -     amLODipine  (NORVASC ) 10 MG tablet; Take 1 tablet (10 mg total) by mouth daily. -     CMP14+EGFR Continue all antihypertensives as prescribed.  Reminded to bring in blood pressure log for follow  up appointment.  RECOMMENDATIONS: DASH/Mediterranean Diets are healthier choices for HTN.    Frequency of urination -     PSA  Prediabetes -     Hemoglobin A1c -     CMP14+EGFR  Cigarette smoker -     CT CHEST LUNG CANCER SCREENING LOW DOSE WO CONTRAST; Future Cody Porter was counseled on the dangers of tobacco use, and was advised to quit. Reviewed strategies to maximize success, including removing cigarettes and smoking materials from environment, stress management and support of family/friends as well as pharmacological alternatives including: Wellbutrin, Chantix, Nicotine patch, Nicotine gum or lozenges. Smoking cessation support: smoking cessation hotline: 1-800-QUIT-NOW.     Patient has been counseled on age-appropriate routine health concerns for screening and prevention. These are reviewed and up-to-date. Referrals have been placed accordingly. Immunizations are up-to-date or declined.    Subjective:   Chief Complaint  Patient presents with   Hypertension    Cody Porter 64 y.o. male presents to office today for follow-up to hypertension.  He declines Colonoscopy.  He Is Up-To-Date on His Flu Vaccine, Pneumonia and Shingles.   He has a past medical history of ETOH abuse (01/28/2019), GERD (gastroesophageal reflux disease), Hypertension, Leg DVT (deep venous thromboembolism), acute, bilateral (HCC) (01/28/2019), and Pulmonary embolus (HCC) (01/28/2019).      HTN Blood pressure is normal today.  He is taking metoprolol  25 mg BID and amlodipine  10 mg daily as prescribed. He does continue to smoke cigarettes BP Readings from Last 3 Encounters:   11/21/24 131/77  08/15/24 (!) 141/85  05/12/24 110/72     Review of Systems  Constitutional:  Negative for fever, malaise/fatigue and weight loss.  HENT: Negative.  Negative for nosebleeds.   Eyes: Negative.  Negative for blurred vision, double vision and photophobia.  Respiratory: Negative.  Negative for cough and shortness of breath.   Cardiovascular: Negative.  Negative for chest pain, palpitations and leg swelling.  Gastrointestinal: Negative.  Negative for heartburn, nausea and vomiting.  Musculoskeletal: Negative.  Negative for myalgias.  Neurological: Negative.  Negative for dizziness, focal weakness, seizures and headaches.  Psychiatric/Behavioral: Negative.  Negative for suicidal ideas.     Past Medical History:  Diagnosis Date   ETOH abuse 01/28/2019   GERD (gastroesophageal reflux disease)    Hypertension    Leg DVT (deep venous thromboembolism), acute, bilateral (HCC) 01/28/2019   Pulmonary embolus (HCC) 01/28/2019    Past Surgical History:  Procedure Laterality Date   HERNIA REPAIR     INGUINAL HERNIA REPAIR Left 01/07/2024   Procedure: OPEN LEFT INGUINAL HERNIA REPAIR WITH MESH;  Surgeon: Vernetta Berg, MD;  Location: [facility];  Service: General;  Laterality: Left;  LMA TAP BLOCK   IR IVC FILTER PLMT / S&I /IMG GUID/MOD SED  02/25/2019   IR RADIOLOGIST EVAL & MGMT  07/17/2019   TONSILLECTOMY      Family History  Problem Relation Age of Onset   COPD Mother    Cancer Mother        Breast x2   Anuerysm Father     Social History Reviewed with no changes to be  made today.   Outpatient Medications Prior to Visit  Medication Sig Dispense Refill   aspirin  EC 81 MG tablet Take 1 tablet (81 mg total) by mouth daily. Patients states he has ASA 81 mg at home. Swallow whole. 120 tablet 3   atorvastatin  (LIPITOR) 40 MG tablet Take 1 tablet (40 mg total) by mouth daily. 90 tablet 1   metoprolol  tartrate (LOPRESSOR ) 25 MG tablet Take 1 tablet (25 mg  total) by mouth 2 (two) times daily. 180 tablet 1   amLODipine  (NORVASC ) 10 MG tablet Take 1 tablet (10 mg total) by mouth daily. 90 tablet 1   No facility-administered medications prior to visit.    Allergies[1]     Objective:    BP 131/77 (BP Location: Left Arm, Patient Position: Sitting, Cuff Size: Normal)   Pulse 60   Ht 5' 7 (1.702 m)   Wt 178 lb (80.7 kg)   SpO2 100%   BMI 27.88 kg/m  Wt Readings from Last 3 Encounters:  11/21/24 178 lb (80.7 kg)  08/15/24 170 lb (77.1 kg)  05/12/24 165 lb 6.4 oz (75 kg)    Physical Exam Vitals and nursing note reviewed.  Constitutional:      Appearance: He is well-developed.  HENT:     Head: Normocephalic and atraumatic.  Cardiovascular:     Rate and Rhythm: Normal rate and regular rhythm.     Heart sounds: Normal heart sounds. No murmur heard.    No friction rub. No gallop.  Pulmonary:     Effort: Pulmonary effort is normal. No tachypnea or respiratory distress.     Breath sounds: Normal breath sounds. No decreased breath sounds, wheezing, rhonchi or rales.  Chest:     Chest wall: No tenderness.  Abdominal:     General: Bowel sounds are normal.     Palpations: Abdomen is soft.  Musculoskeletal:        General: Normal range of motion.     Cervical back: Normal range of motion.  Skin:    General: Skin is warm and dry.  Neurological:     Mental Status: He is alert and oriented to person, place, and time.     Coordination: Coordination normal.  Psychiatric:        Behavior: Behavior normal. Behavior is cooperative.        Thought Content: Thought content normal.        Judgment: Judgment normal.          Patient has been counseled extensively about nutrition and exercise as well as the importance of adherence with medications and regular follow-up. The patient was given clear instructions to go to ER or return to medical center if symptoms don't improve, worsen or new problems develop. The patient verbalized  understanding.   Follow-up: No follow-ups on file.   [name], FNP-BC [facility] [place], [place] [phone]   11/21/2024, 2:10 PM    [1] No Known Allergies  "

## 2024-11-22 LAB — HEMOGLOBIN A1C
Est. average glucose Bld gHb Est-mCnc: 126 mg/dL
Hgb A1c MFr Bld: 6 % — ABNORMAL HIGH (ref 4.8–5.6)

## 2024-11-22 LAB — CMP14+EGFR
ALT: 16 IU/L (ref 0–44)
AST: 21 IU/L (ref 0–40)
Albumin: 4.9 g/dL (ref 3.9–4.9)
Alkaline Phosphatase: 73 IU/L (ref 47–123)
BUN/Creatinine Ratio: 10 (ref 10–24)
BUN: 16 mg/dL (ref 8–27)
Bilirubin Total: 0.4 mg/dL (ref 0.0–1.2)
CO2: 24 mmol/L (ref 20–29)
Calcium: 10 mg/dL (ref 8.6–10.2)
Chloride: 100 mmol/L (ref 96–106)
Creatinine, Ser: 1.62 mg/dL — ABNORMAL HIGH (ref 0.76–1.27)
Globulin, Total: 2.7 g/dL (ref 1.5–4.5)
Glucose: 94 mg/dL (ref 70–99)
Potassium: 4.9 mmol/L (ref 3.5–5.2)
Sodium: 138 mmol/L (ref 134–144)
Total Protein: 7.6 g/dL (ref 6.0–8.5)
eGFR: 47 mL/min/1.73 — ABNORMAL LOW

## 2024-11-22 LAB — PSA: Prostate Specific Ag, Serum: 0.6 ng/mL (ref 0.0–4.0)

## 2024-11-24 ENCOUNTER — Ambulatory Visit: Payer: Self-pay | Admitting: Nurse Practitioner

## 2024-11-25 NOTE — Telephone Encounter (Signed)
 Copied from CRM [id number]. Topic: Clinical - Lab/Test Results >> Nov 25, 2024 12:12 PM   Everette C wrote:  Reason for CRM: The patient's mother has returned missed phone call from practice regarding the patient's most recent labs. Please contact again when possible for additional questions and concerns.
